# Patient Record
Sex: Male | Born: 1945 | Race: White | Hispanic: No | Smoking: Former smoker
Health system: Southern US, Community
[De-identification: ages and names within clinical notes are randomized; demographics above are authoritative.]

## PROBLEM LIST (undated history)

## (undated) DIAGNOSIS — D66 Hereditary factor VIII deficiency: Secondary | ICD-10-CM

## (undated) DIAGNOSIS — B1920 Unspecified viral hepatitis C without hepatic coma: Secondary | ICD-10-CM

## (undated) DIAGNOSIS — D684 Acquired coagulation factor deficiency: Secondary | ICD-10-CM

## (undated) DIAGNOSIS — E162 Hypoglycemia, unspecified: Secondary | ICD-10-CM

## (undated) DIAGNOSIS — K7460 Unspecified cirrhosis of liver: Secondary | ICD-10-CM

## (undated) DIAGNOSIS — K219 Gastro-esophageal reflux disease without esophagitis: Secondary | ICD-10-CM

## (undated) DIAGNOSIS — M1990 Unspecified osteoarthritis, unspecified site: Secondary | ICD-10-CM

## (undated) DIAGNOSIS — K7290 Hepatic failure, unspecified without coma: Secondary | ICD-10-CM

## (undated) DIAGNOSIS — K5790 Diverticulosis of intestine, part unspecified, without perforation or abscess without bleeding: Secondary | ICD-10-CM

## (undated) DIAGNOSIS — D126 Benign neoplasm of colon, unspecified: Secondary | ICD-10-CM

## (undated) DIAGNOSIS — E039 Hypothyroidism, unspecified: Secondary | ICD-10-CM

## (undated) HISTORY — DX: Hereditary factor VIII deficiency: D66

## (undated) HISTORY — DX: Gastro-esophageal reflux disease without esophagitis: K21.9

## (undated) HISTORY — PX: CARPAL TUNNEL RELEASE: SHX101

## (undated) HISTORY — PX: COLONOSCOPY: SHX174

## (undated) HISTORY — DX: Diverticulosis of intestine, part unspecified, without perforation or abscess without bleeding: K57.90

## (undated) HISTORY — PX: APPENDECTOMY: SHX54

## (undated) HISTORY — DX: Unspecified cirrhosis of liver: K74.60

## (undated) HISTORY — PX: ESOPHAGOGASTRODUODENOSCOPY: SHX1529

## (undated) HISTORY — DX: Benign neoplasm of colon, unspecified: D12.6

---

## 2004-02-29 ENCOUNTER — Emergency Department (HOSPITAL_COMMUNITY): Admission: EM | Admit: 2004-02-29 | Discharge: 2004-02-29 | Payer: Self-pay | Admitting: Emergency Medicine

## 2009-07-24 ENCOUNTER — Emergency Department (HOSPITAL_COMMUNITY): Admission: EM | Admit: 2009-07-24 | Discharge: 2009-07-24 | Payer: Self-pay | Admitting: Emergency Medicine

## 2011-01-27 LAB — POCT CARDIAC MARKERS
CKMB, poc: 2.1 ng/mL (ref 1.0–8.0)
CKMB, poc: 2.4 ng/mL (ref 1.0–8.0)
Myoglobin, poc: 179 ng/mL (ref 12–200)
Myoglobin, poc: 298 ng/mL (ref 12–200)
Troponin i, poc: <0.05 ng/mL (ref 0.00–0.09)
Troponin i, poc: <0.05 ng/mL (ref 0.00–0.09)

## 2011-01-27 LAB — DIFFERENTIAL
Basophils Absolute: 0 10*3/uL (ref 0.0–0.1)
Basophils Relative: 1 % (ref 0–1)
Eosinophils Absolute: 0.3 10*3/uL (ref 0.0–0.7)
Eosinophils Relative: 5 % (ref 0–5)
Lymphocytes Relative: 9 % — ABNORMAL LOW (ref 12–46)
Lymphs Abs: 0.6 10*3/uL — ABNORMAL LOW (ref 0.7–4.0)
Monocytes Absolute: 0.5 10*3/uL (ref 0.1–1.0)
Monocytes Relative: 7 % (ref 3–12)
Neutro Abs: 5.3 10*3/uL (ref 1.7–7.7)
Neutrophils Relative %: 79 % — ABNORMAL HIGH (ref 43–77)

## 2011-01-27 LAB — CBC
HCT: 39.3 % (ref 39.0–52.0)
Hemoglobin: 13.6 g/dL (ref 13.0–17.0)
MCHC: 34.6 g/dL (ref 30.0–36.0)
MCV: 101.3 fL — ABNORMAL HIGH (ref 78.0–100.0)
Platelets: 120 10*3/uL — ABNORMAL LOW (ref 150–400)
RBC: 3.88 MIL/uL — ABNORMAL LOW (ref 4.22–5.81)
RDW: 13 % (ref 11.5–15.5)
WBC: 6.7 10*3/uL (ref 4.0–10.5)

## 2011-01-27 LAB — BASIC METABOLIC PANEL
BUN: 10 mg/dL (ref 6–23)
CO2: 25 mEq/L (ref 19–32)
Calcium: 8.9 mg/dL (ref 8.4–10.5)
Chloride: 109 mEq/L (ref 96–112)
Creatinine, Ser: 1.08 mg/dL (ref 0.4–1.5)
GFR calc Af Amer: >60 mL/min (ref >60)
GFR calc non Af Amer: >60 mL/min (ref >60)
Glucose, Bld: 92 mg/dL (ref 70–99)
Potassium: 3.7 mEq/L (ref 3.5–5.1)
Sodium: 140 mEq/L (ref 135–145)

## 2011-10-03 DIAGNOSIS — Z86010 Personal history of colonic polyps: Secondary | ICD-10-CM | POA: Insufficient documentation

## 2012-04-11 DIAGNOSIS — D126 Benign neoplasm of colon, unspecified: Secondary | ICD-10-CM | POA: Insufficient documentation

## 2012-04-11 DIAGNOSIS — M160 Bilateral primary osteoarthritis of hip: Secondary | ICD-10-CM | POA: Insufficient documentation

## 2012-04-11 DIAGNOSIS — N429 Disorder of prostate, unspecified: Secondary | ICD-10-CM | POA: Insufficient documentation

## 2012-04-11 DIAGNOSIS — R351 Nocturia: Secondary | ICD-10-CM | POA: Insufficient documentation

## 2012-04-11 DIAGNOSIS — M5432 Sciatica, left side: Secondary | ICD-10-CM | POA: Insufficient documentation

## 2012-04-11 DIAGNOSIS — I951 Orthostatic hypotension: Secondary | ICD-10-CM | POA: Insufficient documentation

## 2012-04-11 DIAGNOSIS — M47817 Spondylosis without myelopathy or radiculopathy, lumbosacral region: Secondary | ICD-10-CM | POA: Insufficient documentation

## 2012-04-11 DIAGNOSIS — M47816 Spondylosis without myelopathy or radiculopathy, lumbar region: Secondary | ICD-10-CM | POA: Insufficient documentation

## 2012-04-11 DIAGNOSIS — M1610 Unilateral primary osteoarthritis, unspecified hip: Secondary | ICD-10-CM | POA: Insufficient documentation

## 2012-04-12 DIAGNOSIS — K7290 Hepatic failure, unspecified without coma: Secondary | ICD-10-CM

## 2012-04-12 DIAGNOSIS — I878 Other specified disorders of veins: Secondary | ICD-10-CM | POA: Insufficient documentation

## 2012-04-12 DIAGNOSIS — K7682 Hepatic encephalopathy: Secondary | ICD-10-CM

## 2012-04-12 DIAGNOSIS — I998 Other disorder of circulatory system: Secondary | ICD-10-CM | POA: Insufficient documentation

## 2012-04-12 HISTORY — DX: Hepatic encephalopathy: K76.82

## 2012-04-12 HISTORY — DX: Hepatic failure, unspecified without coma: K72.90

## 2012-09-19 ENCOUNTER — Encounter (HOSPITAL_COMMUNITY): Payer: Self-pay | Admitting: Physical Medicine and Rehabilitation

## 2012-09-19 ENCOUNTER — Emergency Department (HOSPITAL_COMMUNITY)
Admission: EM | Admit: 2012-09-19 | Discharge: 2012-09-19 | Disposition: A | Discharge status: Home / Self Care | Payer: Medicare Other | Attending: Emergency Medicine | Admitting: Emergency Medicine

## 2012-09-19 DIAGNOSIS — K7460 Unspecified cirrhosis of liver: Secondary | ICD-10-CM

## 2012-09-19 DIAGNOSIS — Z79899 Other long term (current) drug therapy: Secondary | ICD-10-CM | POA: Insufficient documentation

## 2012-09-19 DIAGNOSIS — Z8639 Personal history of other endocrine, nutritional and metabolic disease: Secondary | ICD-10-CM | POA: Insufficient documentation

## 2012-09-19 DIAGNOSIS — E7220 Disorder of urea cycle metabolism, unspecified: Secondary | ICD-10-CM | POA: Insufficient documentation

## 2012-09-19 DIAGNOSIS — Z862 Personal history of diseases of the blood and blood-forming organs and certain disorders involving the immune mechanism: Secondary | ICD-10-CM | POA: Insufficient documentation

## 2012-09-19 DIAGNOSIS — R7989 Other specified abnormal findings of blood chemistry: Secondary | ICD-10-CM

## 2012-09-19 HISTORY — DX: Hypoglycemia, unspecified: E16.2

## 2012-09-19 LAB — COMPREHENSIVE METABOLIC PANEL
ALT: 22 U/L (ref 0–53)
AST: 32 U/L (ref 0–37)
Albumin: 3.4 g/dL — ABNORMAL LOW (ref 3.5–5.2)
Alkaline Phosphatase: 85 U/L (ref 39–117)
BUN: 13 mg/dL (ref 6–23)
CO2: 22 mEq/L (ref 19–32)
Calcium: 8.8 mg/dL (ref 8.4–10.5)
Chloride: 108 mEq/L (ref 96–112)
Creatinine, Ser: 0.78 mg/dL (ref 0.50–1.35)
GFR calc Af Amer: >90 mL/min (ref >90)
GFR calc non Af Amer: >90 mL/min (ref >90)
Glucose, Bld: 94 mg/dL (ref 70–99)
Potassium: 3.5 mEq/L (ref 3.5–5.1)
Sodium: 142 mEq/L (ref 135–145)
Total Bilirubin: 0.9 mg/dL (ref 0.3–1.2)
Total Protein: 7.2 g/dL (ref 6.0–8.3)

## 2012-09-19 LAB — LIPASE, BLOOD: Lipase: 53 U/L (ref 11–59)

## 2012-09-19 LAB — CBC WITH DIFFERENTIAL/PLATELET
Basophils Absolute: 0 10*3/uL (ref 0.0–0.1)
Basophils Relative: 1 % (ref 0–1)
Eosinophils Absolute: 0.3 10*3/uL (ref 0.0–0.7)
Eosinophils Relative: 8 % — ABNORMAL HIGH (ref 0–5)
HCT: 40.6 % (ref 39.0–52.0)
Hemoglobin: 13.7 g/dL (ref 13.0–17.0)
Lymphocytes Relative: 20 % (ref 12–46)
Lymphs Abs: 0.9 10*3/uL (ref 0.7–4.0)
MCH: 32.6 pg (ref 26.0–34.0)
MCHC: 33.7 g/dL (ref 30.0–36.0)
MCV: 96.7 fL (ref 78.0–100.0)
Monocytes Absolute: 0.4 10*3/uL (ref 0.1–1.0)
Monocytes Relative: 10 % (ref 3–12)
Neutro Abs: 2.7 10*3/uL (ref 1.7–7.7)
Neutrophils Relative %: 62 % (ref 43–77)
Platelets: 121 10*3/uL — ABNORMAL LOW (ref 150–400)
RBC: 4.2 MIL/uL — ABNORMAL LOW (ref 4.22–5.81)
RDW: 13.1 % (ref 11.5–15.5)
WBC: 4.4 10*3/uL (ref 4.0–10.5)

## 2012-09-19 LAB — AMMONIA: Ammonia: 130 umol/L — ABNORMAL HIGH (ref 11–60)

## 2012-09-19 NOTE — ED Provider Notes (Signed)
History     CSN: [id number]  Arrival date & time 09/19/12  4098   First MD Initiated Contact with Patient 09/19/12 [phone]      Chief Complaint  Patient presents with  . Altered Mental Status    HPI Pt presents to department for evaluation of altered mental status. Per wife pt slept all day yesterday and was unable to remember daily routine. Wife states he began yelling this morning after waking up @8 :30am. Wife says he is not acting like himself today. Pupils equal and reactive upon arrival. Able to move all extremities at the time. He is alert and answering questions appropriately.  Past Medical History  Diagnosis Date  . Hypoglycemia     No past surgical history on file.  History reviewed. No pertinent family history.  History  Substance Use Topics  . Smoking status: Never Smoker   . Smokeless tobacco: Not on file  . Alcohol Use: No      Review of Systems  All other systems reviewed and are negative.    Allergies  Review of patient's allergies indicates no known allergies.  Home Medications   Current Outpatient Rx  Name  Route  Sig  Dispense  Refill  . ALFUZOSIN HCL ER 10 MG PO TB24   Oral   Take 10 mg by mouth daily.         Marland Kitchen LACTULOSE 10 GM/15ML PO SOLN   Oral   Take 20 g by mouth 2 (two) times daily.         Marland Kitchen LEVOTHYROXINE SODIUM 50 MCG PO TABS   Oral   Take 50 mcg by mouth daily.         Marland Kitchen PANTOPRAZOLE SODIUM 40 MG PO TBEC   Oral   Take 40 mg by mouth daily.           BP 159/89  Pulse 78  Temp 97.3 F (36.3 C) (Oral)  Resp 20  SpO2 99%  Physical Exam  Nursing note and vitals reviewed. Constitutional: He is oriented to person, place, and time. He appears well-developed and well-nourished. No distress.  HENT:  Head: Normocephalic and atraumatic.  Eyes: Pupils are equal, round, and reactive to light.  Neck: Normal range of motion.  Cardiovascular: Normal rate and intact distal pulses.   Pulmonary/Chest: No respiratory  distress.  Abdominal: Normal appearance. He exhibits no distension.  Musculoskeletal: Normal range of motion.  Neurological: He is alert and oriented to person, place, and time. He displays no atrophy. No cranial nerve deficit or sensory deficit. GCS eye subscore is 4. GCS verbal subscore is 5. GCS motor subscore is 6.  Skin: Skin is warm and dry. No rash noted.  Psychiatric: He has a normal mood and affect. His behavior is normal.    ED Course  Procedures (including critical care time) After speaking with the wife she thinks he's not been taking his medicines as each should.  She wants to take him home and make sure he takes his lactulose as directed.  At the present time the patient seems alert he wants to go home he is not encephalopathic and I feel even know he has not elevated ammonia level they have medication to treat that.  They can return to emergency Department or to bed this should symptoms not improve or should they worsen.   Wife Labs Reviewed  CBC WITH DIFFERENTIAL - Abnormal; Notable for the following:    RBC 4.20 (*)     Platelets 121 (*)  Eosinophils Relative 8 (*)     All other components within normal limits  COMPREHENSIVE METABOLIC PANEL - Abnormal; Notable for the following:    Albumin 3.4 (*)     All other components within normal limits  AMMONIA - Abnormal; Notable for the following:    Ammonia 130 (*)     All other components within normal limits  LIPASE, BLOOD   No results found.   1. Increased ammonia level   2. Cirrhosis       MDM          Nelia Shi, MD 09/19/12 1356

## 2012-09-19 NOTE — ED Notes (Signed)
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 Contracted from blood transfusion. 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

## 2012-09-19 NOTE — ED Notes (Signed)
Pt presents to department for evaluation of altered mental status. Per wife pt slept all day yesterday and was unable to remember daily routine. Wife states he began yelling this morning after waking up @8 :30am. Wife says he is not acting like himself today. Pupils equal and reactive upon arrival. Able to move all extremities at the time. He is alert and answering questions appropriately.

## 2015-07-02 ENCOUNTER — Emergency Department (HOSPITAL_COMMUNITY)
Admission: EM | Admit: 2015-07-02 | Discharge: 2015-07-02 | Disposition: A | Discharge status: Other Acute Inpatient Hospital | Payer: Medicare Other | Attending: Emergency Medicine | Admitting: Emergency Medicine

## 2015-07-02 ENCOUNTER — Encounter (HOSPITAL_COMMUNITY): Payer: Self-pay | Admitting: Emergency Medicine

## 2015-07-02 DIAGNOSIS — D67 Hereditary factor IX deficiency: Secondary | ICD-10-CM

## 2015-07-02 DIAGNOSIS — Z79899 Other long term (current) drug therapy: Secondary | ICD-10-CM | POA: Diagnosis not present

## 2015-07-02 DIAGNOSIS — D68311 Acquired hemophilia: Secondary | ICD-10-CM | POA: Insufficient documentation

## 2015-07-02 DIAGNOSIS — Z8639 Personal history of other endocrine, nutritional and metabolic disease: Secondary | ICD-10-CM | POA: Diagnosis not present

## 2015-07-02 DIAGNOSIS — K921 Melena: Secondary | ICD-10-CM | POA: Diagnosis not present

## 2015-07-02 DIAGNOSIS — R197 Diarrhea, unspecified: Secondary | ICD-10-CM | POA: Diagnosis present

## 2015-07-02 HISTORY — DX: Acquired coagulation factor deficiency: D68.4

## 2015-07-02 LAB — COMPREHENSIVE METABOLIC PANEL
ALT: 24 U/L (ref 17–63)
AST: 34 U/L (ref 15–41)
Albumin: 2.8 g/dL — ABNORMAL LOW (ref 3.5–5.0)
Alkaline Phosphatase: 59 U/L (ref 38–126)
Anion gap: 7 (ref 5–15)
BUN: 34 mg/dL — ABNORMAL HIGH (ref 6–20)
CO2: 22 mmol/L (ref 22–32)
Calcium: 8.9 mg/dL (ref 8.9–10.3)
Chloride: 110 mmol/L (ref 101–111)
Creatinine, Ser: 0.82 mg/dL (ref 0.61–1.24)
GFR calc Af Amer: >60 mL/min (ref >60)
GFR calc non Af Amer: >60 mL/min (ref >60)
Glucose, Bld: 114 mg/dL — ABNORMAL HIGH (ref 65–99)
Potassium: 3.7 mmol/L (ref 3.5–5.1)
Sodium: 139 mmol/L (ref 135–145)
Total Bilirubin: 1 mg/dL (ref 0.3–1.2)
Total Protein: 5.8 g/dL — ABNORMAL LOW (ref 6.5–8.1)

## 2015-07-02 LAB — URINE MICROSCOPIC-ADD ON

## 2015-07-02 LAB — LIPASE, BLOOD: Lipase: 22 U/L (ref 22–51)

## 2015-07-02 LAB — HEMOGLOBIN AND HEMATOCRIT, BLOOD
HCT: 32.4 % — ABNORMAL LOW (ref 39.0–52.0)
Hemoglobin: 10.9 g/dL — ABNORMAL LOW (ref 13.0–17.0)

## 2015-07-02 LAB — PROTIME-INR
INR: 1.23 (ref 0.00–1.49)
Prothrombin Time: 15.6 seconds — ABNORMAL HIGH (ref 11.6–15.2)

## 2015-07-02 LAB — CBC
HCT: 33.5 % — ABNORMAL LOW (ref 39.0–52.0)
Hemoglobin: 11.3 g/dL — ABNORMAL LOW (ref 13.0–17.0)
MCH: 33.8 pg (ref 26.0–34.0)
MCHC: 33.7 g/dL (ref 30.0–36.0)
MCV: 100.3 fL — ABNORMAL HIGH (ref 78.0–100.0)
Platelets: 129 10*3/uL — ABNORMAL LOW (ref 150–400)
RBC: 3.34 MIL/uL — ABNORMAL LOW (ref 4.22–5.81)
RDW: 13 % (ref 11.5–15.5)
WBC: 8 10*3/uL (ref 4.0–10.5)

## 2015-07-02 LAB — URINALYSIS, ROUTINE W REFLEX MICROSCOPIC
Bilirubin Urine: NEGATIVE
Glucose, UA: NEGATIVE mg/dL
Ketones, ur: NEGATIVE mg/dL
Leukocytes, UA: NEGATIVE
Nitrite: NEGATIVE
Protein, ur: NEGATIVE mg/dL
Specific Gravity, Urine: 1.016 (ref 1.005–1.030)
Urobilinogen, UA: 1 mg/dL (ref 0.0–1.0)
pH: 6.5 (ref 5.0–8.0)

## 2015-07-02 LAB — TYPE AND SCREEN
ABO/RH(D): O POS
Antibody Screen: NEGATIVE

## 2015-07-02 LAB — ABO/RH: ABO/RH(D): O POS

## 2015-07-02 MED ORDER — SODIUM CHLORIDE 0.9 % IV SOLN
80.0000 mg | Freq: Once | INTRAVENOUS | Status: AC
  Administered 2015-07-02: 80 mg via INTRAVENOUS
  Filled 2015-07-02: qty 80

## 2015-07-02 MED ORDER — SODIUM CHLORIDE 0.9 % IV SOLN
8.0000 mg/h | INTRAVENOUS | Status: DC
  Administered 2015-07-02: 8 mg/h via INTRAVENOUS
  Filled 2015-07-02 (×2): qty 80

## 2015-07-02 MED ORDER — SODIUM CHLORIDE 0.9 % IV BOLUS (SEPSIS)
1000.0000 mL | Freq: Once | INTRAVENOUS | Status: AC
  Administered 2015-07-02: 1000 mL via INTRAVENOUS

## 2015-07-02 MED ORDER — COAGULATION FACTOR IX (RECOMB) 250 UNITS IV SOLR
8000.0000 [IU] | Freq: Once | INTRAVENOUS | Status: AC
  Administered 2015-07-02: 8000 [IU] via INTRAVENOUS

## 2015-07-02 NOTE — ED Notes (Signed)
Benefix verified by pharmacy prior to giving.  Home med given.

## 2015-07-02 NOTE — ED Provider Notes (Signed)
CSN: [id number]     Arrival date & time 07/02/15  1704 History   First MD Initiated Contact with Patient 07/02/15 1803     Chief Complaint  Patient presents with  . Diarrhea  . Emesis  . Melena     HPI   Justin Hampton is a 69 y.o. male with a PMH of hypoglycemia and factor IV deficiency who presents to the ED with vomiting and diarrhea, which started this morning. He states he woke up this morning and felt dizzy; he thought his blood sugar was low, so her took two sugar pills. He then felt nauseous, and had one episode of emesis, which he reports was black. He also states he had 3 episodes of "black loose stools" today. He denies recent illness, headache, lightheadedness, syncope, chest pain, shortness of breath, fever, chills, abdominal pain, dysuria, urgency, frequency. He states this has never happened to him before. Denies bright red blood in stool.   Past Medical History  Diagnosis Date  . Hypoglycemia   . 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 Appendectomy     No family history on file. Social History  Substance Use Topics  . Smoking status: Never Smoker   . Smokeless tobacco: None  . Alcohol Use: No      Review of Systems  Constitutional: Negative for fever, chills, activity change, appetite change and fatigue.  Respiratory: Negative for cough and shortness of breath.   Cardiovascular: Negative for chest pain, palpitations and leg swelling.  Gastrointestinal: Positive for nausea, vomiting and diarrhea. Negative for abdominal pain, constipation and abdominal distention.  Genitourinary: Negative for dysuria, urgency and frequency.  Musculoskeletal: Negative for myalgias, back pain, arthralgias and neck pain.  Skin: Negative for color change, pallor, rash and wound.  Neurological: Negative for dizziness, syncope, weakness, light-headedness, numbness and headaches.  All other systems reviewed and are  negative.     Allergies  Review of patient's allergies indicates no known allergies.  Home Medications   Prior to Admission medications   Medication Sig Start Date End Date Taking? Authorizing Provider  alfuzosin (UROXATRAL) 10 MG 24 hr tablet Take 10 mg by mouth daily.    Historical Provider, MD  lactulose (CHRONULAC) 10 GM/15ML solution Take 20 g by mouth 2 (two) times daily.    Historical Provider, MD  levothyroxine (SYNTHROID, LEVOTHROID) 50 MCG tablet Take 50 mcg by mouth daily.    Historical Provider, MD  pantoprazole (PROTONIX) 40 MG tablet Take 40 mg by mouth daily.    Historical Provider, MD    BP 135/81 mmHg  Pulse 78  Temp(Src) 98.2 F (36.8 C) (Oral)  Resp 15  Ht 5\' 9"  (1.753 m)  Wt 226 lb (102.513 kg)  BMI 33.36 kg/m2  SpO2 99% Physical Exam  Constitutional: He is oriented to person, place, and time. He appears well-developed and well-nourished. No distress.  HENT:  Head: Normocephalic and atraumatic.  Right Ear: External ear normal.  Left Ear: External ear normal.  Nose: Nose normal.  Mouth/Throat: Uvula is midline and mucous membranes are normal.  Eyes: Conjunctivae, EOM and lids are normal. Pupils are equal, round, and reactive to light. Right eye exhibits no discharge. Left eye exhibits no discharge. No scleral icterus.  Neck: Normal range of motion. Neck supple.  Cardiovascular: Normal rate, regular rhythm, normal heart sounds, intact distal pulses and normal pulses.   Pulmonary/Chest: Effort normal and breath sounds normal.  No respiratory distress. He has no wheezes. He has no rales. He exhibits no tenderness.  Abdominal: Soft. Normal appearance and bowel sounds are normal. He exhibits no distension and no mass. There is no tenderness. There is no rigidity, no rebound and no guarding.  Genitourinary: Guaiac positive stool.  Melanotic stool in rectal vault. Hemoccult positive.  Musculoskeletal: Normal range of motion. He exhibits no edema or tenderness.   Neurological: He is alert and oriented to person, place, and time. He has normal strength.  Skin: Skin is warm, dry and intact. No rash noted. He is not diaphoretic. No erythema. No pallor.  Psychiatric: He has a normal mood and affect. His speech is normal and behavior is normal. Judgment and thought content normal.  Nursing note and vitals reviewed.   ED Course  Procedures (including critical care time)  Labs Review Labs Reviewed  COMPREHENSIVE METABOLIC PANEL - Abnormal; Notable for the following:    Glucose, Bld 114 (*)    BUN 34 (*)    Total Protein 5.8 (*)    Albumin 2.8 (*)    All other components within normal limits  CBC - Abnormal; Notable for the following:    RBC 3.34 (*)    Hemoglobin 11.3 (*)    HCT 33.5 (*)    MCV 100.3 (*)    Platelets 129 (*)    All other components within normal limits  URINALYSIS, ROUTINE W REFLEX MICROSCOPIC (NOT AT [facility]) - Abnormal; Notable for the following:    Hgb urine dipstick SMALL (*)    All other components within normal limits  LIPASE, BLOOD  URINE MICROSCOPIC-ADD ON  PROTIME-INR  POC OCCULT BLOOD, ED  TYPE AND SCREEN    Imaging Review No results found.   I have personally reviewed and evaluated these lab results as part of my medical decision-making.   EKG Interpretation None      MDM   Final diagnoses:  Hemophilia B  Melena    70 year old male presents with vomiting and diarrhea, which started this morning. Reports 1 episode of black emesis and 3 episodes of melena. Has a history of factor IV deficiency and is followed in hemophilia clinic at [facility] by Dr. Alba Cory. Patient reports he takes benefix (usually 3000-4000 units) with acute bleeding.  Patient is afebrile. Vital signs stable. BP 135/81. Mucous membranes dry. Abdomen soft, non-tender, non-distended. Melanotic stool in rectal vault. Hemoccult positive.  CBC with hemoglobin 11.3, last hemoglobin 13.3 in June 2016. CMP, lipase unremarkable. Coags pending. UA  negative for infection, small hemoglobin. Coags, type and screen pending  Given 1L bolus NS in the ED.  Patient discussed with and seen by Dr. Ralene Bathe. Hematology consulted. Patient to be admitted for further evaluation and management. Hemophilia clinic at [facility] contacted about benefix dosing and possible admission.   BP 135/81 mmHg  Pulse 78  Temp(Src) 98.2 F (36.8 C) (Oral)  Resp 15  Ht 5\' 9"  (1.753 m)  Wt 226 lb (102.513 kg)  BMI 33.36 kg/m2  SpO2 99%     Marella Chimes, PA-C 07/02/15 2018  Quintella Reichert, MD 07/03/15 1623

## 2015-07-02 NOTE — ED Notes (Signed)
Patient here with complaint of diarrhea and vomiting starting this AM around 0600. States 1 episode of emesis and 3 episodes of black diarrhea. Reports history of clotting factor deficiency.

## 2015-07-02 NOTE — ED Notes (Signed)
Called [facility] for transfer to [facility] - on the way now

## 2015-07-02 NOTE — ED Notes (Signed)
[facility] here to transport patient at this time.  ?

## 2016-05-21 ENCOUNTER — Emergency Department (HOSPITAL_COMMUNITY)
Admission: EM | Admit: 2016-05-21 | Discharge: 2016-05-21 | Disposition: A | Discharge status: Home / Self Care | Payer: Medicare Other | Attending: Emergency Medicine | Admitting: Emergency Medicine

## 2016-05-21 ENCOUNTER — Emergency Department (HOSPITAL_COMMUNITY): Payer: Medicare Other

## 2016-05-21 ENCOUNTER — Encounter (HOSPITAL_COMMUNITY): Payer: Self-pay | Admitting: *Deleted

## 2016-05-21 DIAGNOSIS — S60512A Abrasion of left hand, initial encounter: Secondary | ICD-10-CM | POA: Diagnosis not present

## 2016-05-21 DIAGNOSIS — S0990XA Unspecified injury of head, initial encounter: Secondary | ICD-10-CM | POA: Insufficient documentation

## 2016-05-21 DIAGNOSIS — S52501A Unspecified fracture of the lower end of right radius, initial encounter for closed fracture: Secondary | ICD-10-CM

## 2016-05-21 DIAGNOSIS — Y929 Unspecified place or not applicable: Secondary | ICD-10-CM | POA: Diagnosis not present

## 2016-05-21 DIAGNOSIS — Y999 Unspecified external cause status: Secondary | ICD-10-CM | POA: Insufficient documentation

## 2016-05-21 DIAGNOSIS — W0110XA Fall on same level from slipping, tripping and stumbling with subsequent striking against unspecified object, initial encounter: Secondary | ICD-10-CM | POA: Diagnosis not present

## 2016-05-21 DIAGNOSIS — Z79899 Other long term (current) drug therapy: Secondary | ICD-10-CM | POA: Insufficient documentation

## 2016-05-21 DIAGNOSIS — Y9301 Activity, walking, marching and hiking: Secondary | ICD-10-CM | POA: Insufficient documentation

## 2016-05-21 DIAGNOSIS — S6991XA Unspecified injury of right wrist, hand and finger(s), initial encounter: Secondary | ICD-10-CM | POA: Diagnosis present

## 2016-05-21 DIAGNOSIS — S52591A Other fractures of lower end of right radius, initial encounter for closed fracture: Secondary | ICD-10-CM | POA: Insufficient documentation

## 2016-05-21 DIAGNOSIS — D67 Hereditary factor IX deficiency: Secondary | ICD-10-CM | POA: Diagnosis not present

## 2016-05-21 MED ORDER — OXYCODONE-ACETAMINOPHEN 5-325 MG PO TABS
1.0000 | ORAL_TABLET | Freq: Once | ORAL | Status: AC
  Administered 2016-05-21: 1 via ORAL
  Filled 2016-05-21: qty 1

## 2016-05-21 MED ORDER — COAGULATION FACTOR IX (RECOMB) 1000 UNITS IV KIT
6180.0000 [IU] | PACK | Freq: Once | INTRAVENOUS | Status: AC
  Administered 2016-05-21: 6180 [IU] via INTRAVENOUS
  Filled 2016-05-21: qty 6180

## 2016-05-21 MED ORDER — OXYCODONE-ACETAMINOPHEN 5-325 MG PO TABS
1.0000 | ORAL_TABLET | Freq: Once | ORAL | Status: AC
Start: 2016-05-21 — End: 2016-05-21
  Administered 2016-05-21: 1 via ORAL
  Filled 2016-05-21: qty 1

## 2016-05-21 MED ORDER — OXYCODONE-ACETAMINOPHEN 5-325 MG PO TABS: 1.0000 | ORAL_TABLET | ORAL | 0 refills | Status: DC | PRN

## 2016-05-21 NOTE — ED Triage Notes (Signed)
Per EMS pt was outside walking his dog, got off balance and fell forward; c/o right wrist pain. EMS reports swelling and bruising noted

## 2016-05-21 NOTE — ED Provider Notes (Signed)
[facility] DEPT Provider Note   CSN: [id number] Arrival date & time: 05/21/16  1028  First Provider Contact:  First MD Initiated Contact with Patient 05/21/16 1104        History   Chief Complaint Chief Complaint  Patient presents with  . Fall  . Wrist Injury    HPI Justin Hampton is a 70 y.o. male.  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 The patient reports that he was walking his dog when he tripped and fell onto his outstretched right arm. The patient reports new pain, swelling, deformity to that arm. He reports a similar injury in January of this year. He followed at [facility] but did not require any surgical intervention. Patient does report having hit his head but denies any significant head injury. He denies headache, changes in vision, nausea, vomiting, neurologic changes.  Reports a tetanus vaccination in January of this year.      Past Medical History:  Diagnosis Date  . 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 Hypoglycemia     There are no active problems to display for this patient.   Past Surgical History:  Procedure Laterality Date  . APPENDECTOMY         Home Medications    Prior to Admission medications   Medication Sig Start Date End Date Taking? Authorizing Provider  lactulose (CHRONULAC) 10 GM/15ML solution Take 20 g by mouth 2 (two) times daily.  01/13/16 01/12/17 Yes Historical Provider, MD  levothyroxine (SYNTHROID, LEVOTHROID) 50 MCG tablet Take 50 mcg by mouth daily.   Yes Historical Provider, MD  spironolactone (ALDACTONE) 100 MG tablet Take 100 mg by mouth daily.  05/04/16  Yes Historical Provider, MD  oxyCODONE-acetaminophen (PERCOCET/ROXICET) 5-325 MG tablet Take 1-2 tablets by mouth every 4 (four) hours as needed for severe pain. 05/21/16   Harvel Quale, MD    Family History No family history on file.  Social History Social History  Substance Use Topics  . Smoking  status: Never Smoker  . Smokeless tobacco: Never Used  . Alcohol use No     Allergies   Morphine and Zolpidem   Review of Systems Review of Systems  Constitutional: Negative for appetite change, diaphoresis, fatigue and unexpected weight change.  HENT: Negative for congestion, postnasal drip, rhinorrhea and sinus pressure.   Respiratory: Negative for cough, chest tightness and shortness of breath.   Cardiovascular: Negative for chest pain and palpitations.  Gastrointestinal: Negative for abdominal pain, constipation, diarrhea, nausea and vomiting.  Genitourinary: Negative for dysuria, hematuria and urgency.  Musculoskeletal: Positive for arthralgias (right wrist). Negative for back pain, neck pain and neck stiffness.  Skin: Negative for rash.  Neurological: Negative for dizziness, seizures, weakness, light-headedness and numbness.  Hematological: Bruises/bleeds easily.     Physical Exam Updated Vital Signs BP 146/88 (BP Location: Left Arm)   Pulse 78   Resp 16   SpO2 100%   Physical Exam  Constitutional: He is oriented to person, place, and time. He appears well-developed and well-nourished. No distress.  HENT:  Head: Normocephalic and atraumatic.  Right Ear: External ear normal.  Left Ear: External ear normal.  Mouth/Throat: Oropharynx is clear and moist. No oropharyngeal exudate.  Eyes: EOM are normal. Pupils are equal, round, and reactive to light.  Neck: Normal range of motion. Neck supple.  Cardiovascular: Normal rate, regular rhythm, normal heart sounds and intact distal pulses.   No murmur heard. Pulmonary/Chest: Effort normal.  No respiratory distress. He has no wheezes. He has no rales.  Abdominal: Soft. He exhibits no distension. There is no tenderness.  Musculoskeletal: He exhibits no edema.       Right wrist: He exhibits decreased range of motion, tenderness, bony tenderness and deformity. He exhibits no effusion, no crepitus and no laceration.       Right  hand: He exhibits normal range of motion, no bony tenderness, normal two-point discrimination, normal capillary refill, no deformity and no swelling. Normal sensation noted. Normal strength noted.  Neurological: He is alert and oriented to person, place, and time. He has normal strength. No sensory deficit.  Skin: Skin is warm and dry. Abrasion (over ther MCP and PIP joints of the left hand) noted. No rash noted. He is not diaphoretic.  Vitals reviewed.    ED Treatments / Results  Labs (all labs ordered are listed, but only abnormal results are displayed) Labs Reviewed - No data to display  EKG  EKG Interpretation None       Radiology Dg Wrist Complete Right  Result Date: 05/21/2016 CLINICAL DATA:  Fall on outstretched right wrist this morning. EXAM: RIGHT WRIST - COMPLETE 3+ VIEW COMPARISON:  None. FINDINGS: There is a comminuted distal right radial fracture with posterior displacement 1/2 shaft width and posterior angulation. Ulnar styloid fracture noted. No subluxation or dislocation. IMPRESSION: Posteriorly displaced and angulated comminuted distal right radial fracture. Ulnar styloid fracture. Electronically Signed   By: Rolm Baptise M.D.   On: 05/21/2016 11:32  Ct Head Wo Contrast  Result Date: 05/21/2016 CLINICAL DATA:  Post fall, now with wrist pain. EXAM: CT HEAD WITHOUT CONTRAST TECHNIQUE: Contiguous axial images were obtained from the base of the skull through the vertex without intravenous contrast. COMPARISON:  None. FINDINGS: Brain: Mild atrophy with sulcal prominence centralized volume loss with commensurate ex vacuo dilatation of the ventricular system. Scattered periventricular hypodensities compatible with microvascular ischemic disease. The gray-white differentiation is otherwise well maintained without CT evidence of acute large territory infarct. No intraparenchymal or extra-axial mass or hemorrhage. Normal configuration of the ventricles and basilar cisterns. No  midline shift. Vascular: No hyperdense vessel or unexpected calcification. Skull: No displaced calvarial fracture. Sinuses/Orbits: Limited visualization the paranasal sinuses and mastoid air cells is normal. No air-fluid levels. Other: Regional soft tissues appear normal. No radiopaque foreign body. IMPRESSION: Mild atrophy and microvascular ischemic disease without acute intracranial process. Electronically Signed   By: Sandi Mariscal M.D.   On: 05/21/2016 14:22   Procedures Procedures (including critical care time)  Medications Ordered in ED Medications  oxyCODONE-acetaminophen (PERCOCET/ROXICET) 5-325 MG per tablet 1 tablet (not administered)  oxyCODONE-acetaminophen (PERCOCET/ROXICET) 5-325 MG per tablet 1 tablet (1 tablet Oral Given 05/21/16 1130)  coagulation factor IX (recomb) (BENEFIX) injection 6,180 Units (6,180 Units Intravenous Given 05/21/16 1513)     Initial Impression / Assessment and Plan / ED Course  I have reviewed the triage vital signs and the nursing notes.  Pertinent labs & imaging results that were available during my care of the patient were reviewed by me and considered in my medical decision making (see chart for details).  Clinical Course    Patient was seen and evaluated in stable condition. Patient neurovascularly intact. Case and imaging were discussed with Dr.Gramig on the phone who said he was happy to take care of the patient here but thought it may be in the patient's best interest to have his care at [facility] in coordination with his hematologist.  He  recommended short arm splint and outpatient follow up as soon as possible in the office next week.  Case was also discussed with Dr. Alba Cory the patient's hmeatologist who recommended giving the patient 6400 units of Factor 9 which was given.  Wounds were cleaned and dressed.  Patient and wife expressed understanding and agreement with plan for discharge and outpatient follow up at [facility].  Strict return  precautions given.  Final Clinical Impressions(s) / ED Diagnoses   Final diagnoses:  Distal radius fracture, right, closed, initial encounter    New Prescriptions New Prescriptions   OXYCODONE-ACETAMINOPHEN (PERCOCET/ROXICET) 5-325 MG TABLET    Take 1-2 tablets by mouth every 4 (four) hours as needed for severe pain.     Harvel Quale, MD 05/21/16 [phone]

## 2016-05-21 NOTE — ED Notes (Signed)
Bed: WA07 Expected date:  Expected time:  Means of arrival:  Comments: 

## 2016-05-21 NOTE — Discharge Instructions (Signed)
You were seen and evaluated today following her fall. You have broken the bone in your forearm.  Please keep your splint in place and follow-up with an orthopedic surgeon outpatient. After you have been seen by the orthopedic surgeon please make sure you contact Dr. Einar Crow if they are recommending surgery. Return for any sudden changes in sensation, sudden headache, sudden weakness or loss of sensation. Use the pain medication prescribed to help control your pain. Please ice and elevate your arm as much as possible.

## 2016-06-19 ENCOUNTER — Emergency Department (HOSPITAL_COMMUNITY)
Admission: EM | Admit: 2016-06-19 | Discharge: 2016-06-19 | Disposition: A | Discharge status: Home / Self Care | Payer: Medicare Other | Attending: Emergency Medicine | Admitting: Emergency Medicine

## 2016-06-19 ENCOUNTER — Encounter (HOSPITAL_COMMUNITY): Payer: Self-pay | Admitting: Emergency Medicine

## 2016-06-19 ENCOUNTER — Emergency Department (HOSPITAL_COMMUNITY): Payer: Medicare Other

## 2016-06-19 DIAGNOSIS — R0602 Shortness of breath: Secondary | ICD-10-CM | POA: Diagnosis not present

## 2016-06-19 DIAGNOSIS — Z79899 Other long term (current) drug therapy: Secondary | ICD-10-CM | POA: Diagnosis not present

## 2016-06-19 DIAGNOSIS — R0600 Dyspnea, unspecified: Secondary | ICD-10-CM | POA: Diagnosis not present

## 2016-06-19 DIAGNOSIS — R42 Dizziness and giddiness: Secondary | ICD-10-CM | POA: Diagnosis present

## 2016-06-19 LAB — BASIC METABOLIC PANEL
Anion gap: 4 — ABNORMAL LOW (ref 5–15)
BUN: 23 mg/dL — ABNORMAL HIGH (ref 6–20)
CO2: 22 mmol/L (ref 22–32)
Calcium: 9.1 mg/dL (ref 8.9–10.3)
Chloride: 106 mmol/L (ref 101–111)
Creatinine, Ser: 1.22 mg/dL (ref 0.61–1.24)
GFR calc Af Amer: >60 mL/min (ref >60)
GFR calc non Af Amer: 58 mL/min — ABNORMAL LOW (ref >60)
Glucose, Bld: 103 mg/dL — ABNORMAL HIGH (ref 65–99)
Potassium: 5 mmol/L (ref 3.5–5.1)
Sodium: 132 mmol/L — ABNORMAL LOW (ref 135–145)

## 2016-06-19 LAB — CBC WITH DIFFERENTIAL/PLATELET
Basophils Absolute: 0.1 10*3/uL (ref 0.0–0.1)
Basophils Relative: 1 %
Eosinophils Absolute: 0.3 10*3/uL (ref 0.0–0.7)
Eosinophils Relative: 5 %
HCT: 38.1 % — ABNORMAL LOW (ref 39.0–52.0)
Hemoglobin: 12.9 g/dL — ABNORMAL LOW (ref 13.0–17.0)
Lymphocytes Relative: 13 %
Lymphs Abs: 0.7 10*3/uL (ref 0.7–4.0)
MCH: 32.3 pg (ref 26.0–34.0)
MCHC: 33.9 g/dL (ref 30.0–36.0)
MCV: 95.5 fL (ref 78.0–100.0)
Monocytes Absolute: 0.6 10*3/uL (ref 0.1–1.0)
Monocytes Relative: 10 %
Neutro Abs: 3.9 10*3/uL (ref 1.7–7.7)
Neutrophils Relative %: 71 %
Platelets: 162 10*3/uL (ref 150–400)
RBC: 3.99 MIL/uL — ABNORMAL LOW (ref 4.22–5.81)
RDW: 14.3 % (ref 11.5–15.5)
WBC: 5.6 10*3/uL (ref 4.0–10.5)

## 2016-06-19 LAB — BRAIN NATRIURETIC PEPTIDE: B Natriuretic Peptide: 33.9 pg/mL (ref 0.0–100.0)

## 2016-06-19 LAB — TROPONIN I: Troponin I: <0.03 ng/mL (ref <0.03)

## 2016-06-19 LAB — I-STAT TROPONIN, ED: Troponin i, poc: 0 ng/mL (ref 0.00–0.08)

## 2016-06-19 NOTE — Discharge Instructions (Signed)
You were seen in the ED today with sudden difficulty breathing and lightheadedness. We looked closely for signs of heart attack and found none. Follow up with your PCP in the coming days for review of your visit and consideration of additional testing.   I have also provided contact information for a Cardiologist who you may also call for follow up and consideration of stress testing.   Return to the ED with any additional difficulty breathing, chest pain, sudden lightheadedness, or other concerning symptoms for you.

## 2016-06-19 NOTE — ED Triage Notes (Signed)
Pt c/o SOB, onset this morning on waking. Pt reports dizziness on exertion. Patient started taking spironolactone 1 month ago. No other symptoms.

## 2016-06-19 NOTE — ED Provider Notes (Signed)
Emergency Department Provider Note   I have reviewed the triage vital signs and the nursing notes.   HISTORY  Chief Complaint Shortness of Breath   HPI Justin Hampton is a 70 y.o. male with PMH hemophilia B and episodic hypoglycemia presents to the emergency department for evaluation of sudden onset lightheadedness and difficulty breathing. Patient states symptoms began after getting out of bed. He initially felt difficulty breathing which was seen and followed by lightheadedness. No similar prior episodes. No associated chest discomfort with this episode. Patient denies vertigo. No unilateral weakness or numbness. No headache. Patient was able to breathe slowly and control his breathing. He feels now he is back to normal. No specific exacerbating or alleviating factors.    Past Medical History:  Diagnosis Date  . 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 Hypoglycemia     There are no active problems to display for this patient.   Past Surgical History:  Procedure Laterality Date  . APPENDECTOMY      Current Outpatient Rx  . Order #: [id number] Class: Historical Med  . Order #: [id number] Class: Historical Med  . Order #: [id number] Class: Print  . Order #: [id number] Class: Historical Med    Allergies Morphine and Zolpidem  History reviewed. No pertinent family history.  Social History Social History  Substance Use Topics  . Smoking status: Never Smoker  . Smokeless tobacco: Never Used  . Alcohol use No    Review of Systems  Constitutional: No fever/chills; Positive lightheadedness.  Eyes: No visual changes. ENT: No sore throat. Cardiovascular: Denies chest pain. Respiratory: Positive shortness of breath. Gastrointestinal: No abdominal pain.  No nausea, no vomiting.  No diarrhea.  No constipation. Genitourinary: Negative for dysuria. Musculoskeletal: Negative for back pain. Skin: Negative for rash. Neurological: Negative for headaches,  focal weakness or numbness.  10-point ROS otherwise negative.  ____________________________________________   PHYSICAL EXAM:  VITAL SIGNS: ED Triage Vitals  Enc Vitals Group     BP 06/19/16 1011 134/70     Pulse Rate 06/19/16 1011 78     Resp 06/19/16 1011 16     Temp 06/19/16 1017 98.1 F (36.7 C)     Temp Source 06/19/16 1017 Oral     SpO2 06/19/16 1011 100 %    Constitutional: Alert and oriented. Well appearing and in no acute distress. Eyes: Conjunctivae are normal. Head: Atraumatic. Nose: No congestion/rhinnorhea. Mouth/Throat: Mucous membranes are moist.  Oropharynx non-erythematous. Neck: No stridor.   Cardiovascular: Normal rate, regular rhythm. Good peripheral circulation. Grossly normal heart sounds.   Respiratory: Normal respiratory effort.  No retractions. Lungs CTAB. Gastrointestinal: Soft and nontender. No distention.  Musculoskeletal: No lower extremity tenderness nor edema. No gross deformities of extremities. Cast over right forearm.  Neurologic:  Normal speech and language. No gross focal neurologic deficits are appreciated.  Skin:  Skin is warm, dry and intact. No rash noted. Psychiatric: Mood and affect are normal. Speech and behavior are normal.  ____________________________________________   LABS (all labs ordered are listed, but only abnormal results are displayed)  Labs Reviewed  BASIC METABOLIC PANEL - Abnormal; Notable for the following:       Result Value   Sodium 132 (*)    Glucose, Bld 103 (*)    BUN 23 (*)    GFR calc non Af Amer 58 (*)    Anion gap 4 (*)    All other components within normal limits  CBC WITH DIFFERENTIAL/PLATELET - Abnormal; Notable for  the following:    RBC 3.99 (*)    Hemoglobin 12.9 (*)    HCT 38.1 (*)    All other components within normal limits  BRAIN NATRIURETIC PEPTIDE  TROPONIN I  I-STAT TROPOININ, ED   ____________________________________________  EKG   EKG Interpretation  Date/Time:  Sunday  June 19 2016 10:13:46 EDT Ventricular Rate:  75 PR Interval:    QRS Duration: 86 QT Interval:  400 QTC Calculation: 447 R Axis:   42 Text Interpretation:  Sinus rhythm Low voltage, precordial leads Baseline wander in lead(s) V6 No STEMI.  Confirmed by Zahira Brummond MD, Doratha Mcswain [phone]) on 06/19/2016 10:45:36 AM       ____________________________________________  RADIOLOGY  Dg Chest 2 View  Result Date: 06/19/2016 CLINICAL DATA:  Patient states SOB and dizziness since this morning. Former smoker. EXAM: CHEST  2 VIEW COMPARISON:  07/24/2009 FINDINGS: Normal mediastinum and cardiac silhouette. Normal pulmonary vasculature. No evidence of effusion, infiltrate, or pneumothorax. No acute bony abnormality. Degenerative osteophytosis of the thoracic spine. IMPRESSION: No active cardiopulmonary disease. Electronically Signed   By: Suzy Bouchard M.D.   On: 06/19/2016 10:40    ____________________________________________   PROCEDURES  Procedure(s) performed:   Procedures  None ____________________________________________   INITIAL IMPRESSION / ASSESSMENT AND PLAN / ED COURSE  Pertinent labs & imaging results that were available during my care of the patient were reviewed by me and considered in my medical decision making (see chart for details).  Patient presents to the emergency department for evaluation of difficulty breathing and lightheadedness. No chest pain. Symptoms resolved spontaneously. No history of prior. No history of DVT/PE. Patient has hemophilia B. No new medications. Differential includes: ACS, PE, spontaneous PNX, PNA. EKG reviewed with no acute ischemia or arrhythmia. CXR pending. No treatment at this time with SOB resolved. Plan for labs and serial cardiac biomarkers.   12:32 PM Updated patient on lab results. No symptoms since arrival in the ED. Will follow 2 PM troponin and reassess.   03:12 PM Repeat troponin negative. Patient remains asymptomatic. Plan for discharge  with PCP follow up to discuss outpatient stress testing as needed.   At this time, I do not feel there is any life-threatening condition present. I have reviewed and discussed all results (EKG, imaging, lab, urine as appropriate), exam findings with patient. I have reviewed nursing notes and appropriate previous records.  I feel the patient is safe to be discharged home without further emergent workup. Discussed usual and customary return precautions. Patient and family (if present) verbalize understanding and are comfortable with this plan.  Patient will follow-up with their primary care provider. If they do not have a primary care provider, information for follow-up has been provided to them. All questions have been answered.  ____________________________________________  FINAL CLINICAL IMPRESSION(S) / ED DIAGNOSES  Final diagnoses:  Dyspnea     MEDICATIONS GIVEN DURING THIS VISIT:  None  NEW OUTPATIENT MEDICATIONS STARTED DURING THIS VISIT:  None   Note:  This document was prepared using Dragon voice recognition software and may include unintentional dictation errors.  Nanda Quinton, MD Emergency Medicine   Margette Fast, MD 06/19/16 [phone]

## 2016-07-06 ENCOUNTER — Telehealth: Payer: Self-pay | Admitting: Cardiovascular Disease

## 2016-07-06 ENCOUNTER — Encounter: Payer: Self-pay | Admitting: Cardiovascular Disease

## 2016-07-06 NOTE — Telephone Encounter (Signed)
Called pt and left message asking pt to call back to update Fm and medical Hx and to get previous provider information.

## 2016-07-08 ENCOUNTER — Encounter: Payer: Self-pay | Admitting: Cardiovascular Disease

## 2016-07-08 ENCOUNTER — Ambulatory Visit (INDEPENDENT_AMBULATORY_CARE_PROVIDER_SITE_OTHER): Payer: Medicare Other | Admitting: Cardiovascular Disease

## 2016-07-08 DIAGNOSIS — R0600 Dyspnea, unspecified: Secondary | ICD-10-CM | POA: Insufficient documentation

## 2016-07-08 NOTE — Patient Instructions (Signed)

## 2016-07-08 NOTE — Progress Notes (Signed)
Cardiology Office Note   Date:  07/08/2016   ID:  Justin Hampton, DOB [birth year], MRN [id number]  PCP:  Benjamine Sprague, MD  Cardiologist:   Mertie Moores, MD   Chief Complaint  Patient presents with  . Shortness of Breath   Problem list 1. Joints breath  2. 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Factor 9 deficiency)      History of Present Illness: Justin Hampton is a 70 y.o. male who presents for evaluation of his shortness of breath.  Justin Hampton is seen today for follow up of an ER visit 2 weeks ago He woke up with dizziness and shortness of breath. He had taken an oxycodone tablet the night before to help with pain from his right wrist. ER eval was negative.  BNP was 33.9. Troponin was negative  The symptoms lasted several hours and have not recurred.  Has had some mild dyspnea.   Associated with some mild leg edema  Dr.  Lyanne Co ( in [place], [facility]  prescribed Spironolactone -  Symptoms have resolved. K was 5.0   Walks his dogs occasionally. 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       Past Medical History:  Diagnosis Date  . 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 Hypoglycemia     Past Surgical History:  Procedure Laterality Date  . APPENDECTOMY       Current Outpatient Prescriptions  Medication Sig Dispense Refill  . lactulose (CHRONULAC) 10 GM/15ML solution Take 20 g by mouth 2 (two) times daily as needed for moderate constipation.     Marland Kitchen levothyroxine (SYNTHROID, LEVOTHROID) 50 MCG tablet Take 50 mcg by mouth daily.    Marland Kitchen spironolactone (ALDACTONE) 100 MG tablet Take 100 mg by mouth daily.      No current facility-administered medications for this visit.     Allergies:   Morphine and Zolpidem    Social History:  The patient  reports that he has never smoked. He has never used smokeless tobacco. He reports that he does not drink alcohol or use drugs.   Family History:  The patient's family history includes  Dementia in his mother; Heart attack in his father; Hypertension in his father.    ROS:  Please see the history of present illness.    Review of Systems: Constitutional:  denies fever, chills, diaphoresis, appetite change and fatigue.  HEENT: denies photophobia, eye pain, redness, hearing loss, ear pain, congestion, sore throat, rhinorrhea, sneezing, neck pain, neck stiffness and tinnitus.  Respiratory: denies SOB, DOE, cough, chest tightness, and wheezing.  Cardiovascular: denies chest pain, palpitations and leg swelling.  Gastrointestinal: denies nausea, vomiting, abdominal pain, diarrhea, constipation, blood in stool.  Genitourinary: denies dysuria, urgency, frequency, hematuria, flank pain and difficulty urinating.  Musculoskeletal: denies  myalgias, back pain, joint swelling, arthralgias and gait problem.   Skin: denies pallor, rash and wound.  Neurological: denies dizziness, seizures, syncope, weakness, light-headedness, numbness and headaches.   Hematological: denies adenopathy, easy bruising, personal or family bleeding history.  Psychiatric/ Behavioral: denies suicidal ideation, mood changes, confusion, nervousness, sleep disturbance and agitation.       All other systems are reviewed and negative.    PHYSICAL EXAM: VS:  BP 126/78   Pulse (!) 104   Ht 5\' 9"  (1.753 m)   Wt 225 lb 6.4 oz (102.2 kg)   SpO2 98%   BMI 33.29 kg/m  , BMI Body mass index is 33.29 kg/m. GEN: Well nourished, well developed, in  no acute distress  HEENT: normal  Neck: no JVD, carotid bruits, or masses Cardiac: RRR; no murmurs, rubs, or gallops,no edema  Respiratory:  clear to auscultation bilaterally, normal work of breathing GI: soft, nontender, nondistended, + BS MS: no deformity or atrophy  Skin: warm and dry, no rash Neuro:  Strength and sensation are intact Psych: normal   EKG:  EKG is not ordered today. The ekg ordered 8/27  demonstrates NSR at 75. No ST or T wave changes     Recent Labs: 06/19/2016: B Natriuretic Peptide 33.9; BUN 23; Creatinine, Ser 1.22; Hemoglobin 12.9; Platelets 162; Potassium 5.0; Sodium 132    Lipid Panel No results found for: CHOL, TRIG, HDL, CHOLHDL, VLDL, LDLCALC, LDLDIRECT    Wt Readings from Last 3 Encounters:  07/08/16 225 lb 6.4 oz (102.2 kg)  07/02/15 226 lb (102.5 kg)      Other studies Reviewed: Additional studies/ records that were reviewed today include: . Review of the above records demonstrates:    ASSESSMENT AND PLAN:  1.  Dyspnea:   He has a hx of dyspnea and mild ankle edema. Has improved with Aldactone 100 mg a day . Had a sudden episode of dyspnea and dizziness the morning after taking an Oxydodone tablet. His evaluation in the ER was unremarkable.  He had an echo in July that showed normal LV function.   At this point I have reassured him that the likelihood of him having any significant cardiac disease is very low. He has had an echocardiogram that shows normal LV function.  I have encouraged him to start a walking or exercise program. I've suggested that he join the [facility]. I've advised him to restrict his carbohydrates and to restrict his salt intake. At this point I think that we can see him on an as-needed basis.    Current medicines are reviewed at length with the patient today.  The patient does not have concerns regarding medicines.  Labs/ tests ordered today include:  No orders of the defined types were placed in this encounter.    Disposition:   FU with me as needed.       Mertie Moores, MD  07/08/2016 9:20 AM    [facility] [place], [place], [place]  [address] Phone: [phone]; Fax: [phone]   [facility]  [address] [place], [place]  [address] [phone]   Fax [phone]

## 2017-01-20 DIAGNOSIS — Z8489 Family history of other specified conditions: Secondary | ICD-10-CM | POA: Insufficient documentation

## 2017-01-20 DIAGNOSIS — Z8249 Family history of ischemic heart disease and other diseases of the circulatory system: Secondary | ICD-10-CM | POA: Insufficient documentation

## 2017-05-22 DIAGNOSIS — Z98818 Other dental procedure status: Secondary | ICD-10-CM | POA: Insufficient documentation

## 2017-06-05 ENCOUNTER — Encounter (HOSPITAL_COMMUNITY): Payer: Self-pay

## 2017-06-05 DIAGNOSIS — K7290 Hepatic failure, unspecified without coma: Secondary | ICD-10-CM | POA: Diagnosis present

## 2017-06-05 DIAGNOSIS — D649 Anemia, unspecified: Secondary | ICD-10-CM | POA: Diagnosis present

## 2017-06-05 DIAGNOSIS — K921 Melena: Secondary | ICD-10-CM | POA: Diagnosis present

## 2017-06-05 DIAGNOSIS — K922 Gastrointestinal hemorrhage, unspecified: Secondary | ICD-10-CM | POA: Diagnosis not present

## 2017-06-05 DIAGNOSIS — N281 Cyst of kidney, acquired: Secondary | ICD-10-CM | POA: Diagnosis present

## 2017-06-05 DIAGNOSIS — Z8619 Personal history of other infectious and parasitic diseases: Secondary | ICD-10-CM

## 2017-06-05 DIAGNOSIS — Z885 Allergy status to narcotic agent status: Secondary | ICD-10-CM

## 2017-06-05 DIAGNOSIS — Z6831 Body mass index (BMI) 31.0-31.9, adult: Secondary | ICD-10-CM

## 2017-06-05 DIAGNOSIS — E871 Hypo-osmolality and hyponatremia: Secondary | ICD-10-CM | POA: Diagnosis present

## 2017-06-05 DIAGNOSIS — E669 Obesity, unspecified: Secondary | ICD-10-CM | POA: Diagnosis present

## 2017-06-05 DIAGNOSIS — K5790 Diverticulosis of intestine, part unspecified, without perforation or abscess without bleeding: Secondary | ICD-10-CM | POA: Diagnosis present

## 2017-06-05 DIAGNOSIS — Z888 Allergy status to other drugs, medicaments and biological substances status: Secondary | ICD-10-CM

## 2017-06-05 DIAGNOSIS — E039 Hypothyroidism, unspecified: Secondary | ICD-10-CM | POA: Diagnosis present

## 2017-06-05 DIAGNOSIS — E875 Hyperkalemia: Secondary | ICD-10-CM | POA: Diagnosis present

## 2017-06-05 DIAGNOSIS — K7460 Unspecified cirrhosis of liver: Secondary | ICD-10-CM | POA: Diagnosis present

## 2017-06-05 DIAGNOSIS — D67 Hereditary factor IX deficiency: Principal | ICD-10-CM | POA: Diagnosis present

## 2017-06-05 DIAGNOSIS — N179 Acute kidney failure, unspecified: Secondary | ICD-10-CM | POA: Diagnosis present

## 2017-06-05 LAB — CBC
HCT: 35.1 % — ABNORMAL LOW (ref 39.0–52.0)
Hemoglobin: 12.5 g/dL — ABNORMAL LOW (ref 13.0–17.0)
MCH: 34.7 pg — ABNORMAL HIGH (ref 26.0–34.0)
MCHC: 35.6 g/dL (ref 30.0–36.0)
MCV: 97.5 fL (ref 78.0–100.0)
Platelets: 194 10*3/uL (ref 150–400)
RBC: 3.6 MIL/uL — ABNORMAL LOW (ref 4.22–5.81)
RDW: 12.2 % (ref 11.5–15.5)
WBC: 7.7 10*3/uL (ref 4.0–10.5)

## 2017-06-05 LAB — COMPREHENSIVE METABOLIC PANEL
ALT: 25 U/L (ref 17–63)
AST: 37 U/L (ref 15–41)
Albumin: 3.2 g/dL — ABNORMAL LOW (ref 3.5–5.0)
Alkaline Phosphatase: 43 U/L (ref 38–126)
Anion gap: 8 (ref 5–15)
BUN: 51 mg/dL — ABNORMAL HIGH (ref 6–20)
CO2: 21 mmol/L — ABNORMAL LOW (ref 22–32)
Calcium: 9.3 mg/dL (ref 8.9–10.3)
Chloride: 100 mmol/L — ABNORMAL LOW (ref 101–111)
Creatinine, Ser: 1.54 mg/dL — ABNORMAL HIGH (ref 0.61–1.24)
GFR calc Af Amer: 51 mL/min — ABNORMAL LOW (ref >60)
GFR calc non Af Amer: 44 mL/min — ABNORMAL LOW (ref >60)
Glucose, Bld: 102 mg/dL — ABNORMAL HIGH (ref 65–99)
Potassium: 5.3 mmol/L — ABNORMAL HIGH (ref 3.5–5.1)
Sodium: 129 mmol/L — ABNORMAL LOW (ref 135–145)
Total Bilirubin: 0.9 mg/dL (ref 0.3–1.2)
Total Protein: 6.2 g/dL — ABNORMAL LOW (ref 6.5–8.1)

## 2017-06-05 LAB — TYPE AND SCREEN
ABO/RH(D): O POS
Antibody Screen: NEGATIVE

## 2017-06-05 NOTE — ED Triage Notes (Signed)
Pt reports he has hemophilia B and tonight started having black stool and dizziness. PT reports 2 dark stool pta.

## 2017-06-06 ENCOUNTER — Encounter (HOSPITAL_COMMUNITY): Payer: Self-pay | Admitting: Internal Medicine

## 2017-06-06 ENCOUNTER — Inpatient Hospital Stay (HOSPITAL_COMMUNITY)
Admission: EM | Admit: 2017-06-06 | Discharge: 2017-06-06 | DRG: 813 | Disposition: A | Discharge status: Other Acute Inpatient Hospital | Payer: Medicare Other | Attending: Internal Medicine | Admitting: Internal Medicine

## 2017-06-06 ENCOUNTER — Inpatient Hospital Stay (HOSPITAL_COMMUNITY): Payer: Medicare Other

## 2017-06-06 DIAGNOSIS — E871 Hypo-osmolality and hyponatremia: Secondary | ICD-10-CM

## 2017-06-06 DIAGNOSIS — E669 Obesity, unspecified: Secondary | ICD-10-CM | POA: Diagnosis present

## 2017-06-06 DIAGNOSIS — E875 Hyperkalemia: Secondary | ICD-10-CM | POA: Diagnosis present

## 2017-06-06 DIAGNOSIS — K7460 Unspecified cirrhosis of liver: Secondary | ICD-10-CM | POA: Diagnosis present

## 2017-06-06 DIAGNOSIS — Z885 Allergy status to narcotic agent status: Secondary | ICD-10-CM | POA: Diagnosis not present

## 2017-06-06 DIAGNOSIS — K921 Melena: Secondary | ICD-10-CM | POA: Diagnosis present

## 2017-06-06 DIAGNOSIS — E039 Hypothyroidism, unspecified: Secondary | ICD-10-CM | POA: Diagnosis present

## 2017-06-06 DIAGNOSIS — D649 Anemia, unspecified: Secondary | ICD-10-CM

## 2017-06-06 DIAGNOSIS — K625 Hemorrhage of anus and rectum: Secondary | ICD-10-CM | POA: Diagnosis present

## 2017-06-06 DIAGNOSIS — K922 Gastrointestinal hemorrhage, unspecified: Secondary | ICD-10-CM

## 2017-06-06 DIAGNOSIS — N179 Acute kidney failure, unspecified: Secondary | ICD-10-CM | POA: Diagnosis present

## 2017-06-06 DIAGNOSIS — Z6831 Body mass index (BMI) 31.0-31.9, adult: Secondary | ICD-10-CM | POA: Diagnosis not present

## 2017-06-06 DIAGNOSIS — D67 Hereditary factor IX deficiency: Secondary | ICD-10-CM | POA: Diagnosis present

## 2017-06-06 DIAGNOSIS — N281 Cyst of kidney, acquired: Secondary | ICD-10-CM | POA: Diagnosis present

## 2017-06-06 DIAGNOSIS — K5790 Diverticulosis of intestine, part unspecified, without perforation or abscess without bleeding: Secondary | ICD-10-CM | POA: Diagnosis present

## 2017-06-06 DIAGNOSIS — Z888 Allergy status to other drugs, medicaments and biological substances status: Secondary | ICD-10-CM | POA: Diagnosis not present

## 2017-06-06 DIAGNOSIS — Z8619 Personal history of other infectious and parasitic diseases: Secondary | ICD-10-CM | POA: Diagnosis not present

## 2017-06-06 DIAGNOSIS — K7290 Hepatic failure, unspecified without coma: Secondary | ICD-10-CM | POA: Diagnosis present

## 2017-06-06 HISTORY — DX: Unspecified viral hepatitis C without hepatic coma: B19.20

## 2017-06-06 HISTORY — DX: Hepatic failure, unspecified without coma: K72.90

## 2017-06-06 HISTORY — DX: Hypothyroidism, unspecified: E03.9

## 2017-06-06 LAB — CBC WITH DIFFERENTIAL/PLATELET
Basophils Absolute: 0 10*3/uL (ref 0.0–0.1)
Basophils Relative: 0 %
Eosinophils Absolute: 0.2 10*3/uL (ref 0.0–0.7)
Eosinophils Relative: 2 %
HCT: 30.2 % — ABNORMAL LOW (ref 39.0–52.0)
Hemoglobin: 10.4 g/dL — ABNORMAL LOW (ref 13.0–17.0)
Lymphocytes Relative: 12 %
Lymphs Abs: 1.2 10*3/uL (ref 0.7–4.0)
MCH: 33.8 pg (ref 26.0–34.0)
MCHC: 34.4 g/dL (ref 30.0–36.0)
MCV: 98.1 fL (ref 78.0–100.0)
Monocytes Absolute: 1 10*3/uL (ref 0.1–1.0)
Monocytes Relative: 10 %
Neutro Abs: 7.4 10*3/uL (ref 1.7–7.7)
Neutrophils Relative %: 76 %
Platelets: 184 10*3/uL (ref 150–400)
RBC: 3.08 MIL/uL — ABNORMAL LOW (ref 4.22–5.81)
RDW: 12.5 % (ref 11.5–15.5)
WBC: 9.9 10*3/uL (ref 4.0–10.5)

## 2017-06-06 LAB — COMPREHENSIVE METABOLIC PANEL
ALT: 23 U/L (ref 17–63)
AST: 32 U/L (ref 15–41)
Albumin: 2.9 g/dL — ABNORMAL LOW (ref 3.5–5.0)
Alkaline Phosphatase: 39 U/L (ref 38–126)
Anion gap: 6 (ref 5–15)
BUN: 59 mg/dL — ABNORMAL HIGH (ref 6–20)
CO2: 19 mmol/L — ABNORMAL LOW (ref 22–32)
Calcium: 9.1 mg/dL (ref 8.9–10.3)
Chloride: 103 mmol/L (ref 101–111)
Creatinine, Ser: 1.29 mg/dL — ABNORMAL HIGH (ref 0.61–1.24)
GFR calc Af Amer: >60 mL/min (ref >60)
GFR calc non Af Amer: 54 mL/min — ABNORMAL LOW (ref >60)
Glucose, Bld: 106 mg/dL — ABNORMAL HIGH (ref 65–99)
Potassium: 5.5 mmol/L — ABNORMAL HIGH (ref 3.5–5.1)
Sodium: 128 mmol/L — ABNORMAL LOW (ref 135–145)
Total Bilirubin: 1.1 mg/dL (ref 0.3–1.2)
Total Protein: 5.8 g/dL — ABNORMAL LOW (ref 6.5–8.1)

## 2017-06-06 LAB — POC OCCULT BLOOD, ED: Fecal Occult Bld: POSITIVE — AB

## 2017-06-06 LAB — CBC
HCT: 31.4 % — ABNORMAL LOW (ref 39.0–52.0)
HCT: 31.1 % — ABNORMAL LOW (ref 39.0–52.0)
Hemoglobin: 11.2 g/dL — ABNORMAL LOW (ref 13.0–17.0)
Hemoglobin: 11.1 g/dL — ABNORMAL LOW (ref 13.0–17.0)
MCH: 34.7 pg — ABNORMAL HIGH (ref 26.0–34.0)
MCH: 34.6 pg — ABNORMAL HIGH (ref 26.0–34.0)
MCHC: 35.7 g/dL (ref 30.0–36.0)
MCHC: 35.7 g/dL (ref 30.0–36.0)
MCV: 97.2 fL (ref 78.0–100.0)
MCV: 96.9 fL (ref 78.0–100.0)
Platelets: 163 10*3/uL (ref 150–400)
Platelets: 175 10*3/uL (ref 150–400)
RBC: 3.23 MIL/uL — ABNORMAL LOW (ref 4.22–5.81)
RBC: 3.21 MIL/uL — ABNORMAL LOW (ref 4.22–5.81)
RDW: 12.2 % (ref 11.5–15.5)
RDW: 12.2 % (ref 11.5–15.5)
WBC: 8.6 10*3/uL (ref 4.0–10.5)
WBC: 9.3 10*3/uL (ref 4.0–10.5)

## 2017-06-06 LAB — PROTIME-INR
INR: 1.2
Prothrombin Time: 15.2 seconds (ref 11.4–15.2)

## 2017-06-06 LAB — APTT: aPTT: 51 seconds — ABNORMAL HIGH (ref 24–36)

## 2017-06-06 LAB — MRSA PCR SCREENING: MRSA by PCR: NEGATIVE

## 2017-06-06 MED ORDER — TRAMADOL HCL 50 MG PO TABS: 50.0000 mg | ORAL_TABLET | Freq: Four times a day (QID) | ORAL | Status: DC | PRN

## 2017-06-06 MED ORDER — COAGULATION FACTOR IX (RECOMB) 1000 UNITS IV KIT
8000.0000 [IU] | PACK | Freq: Once | INTRAVENOUS | Status: DC
  Filled 2017-06-06: qty 8000

## 2017-06-06 MED ORDER — SPIRONOLACTONE 25 MG PO TABS
100.0000 mg | ORAL_TABLET | Freq: Every day | ORAL | Status: DC
  Administered 2017-06-06: 100 mg via ORAL
  Filled 2017-06-06: qty 1

## 2017-06-06 MED ORDER — SODIUM CHLORIDE 0.9 % IV BOLUS (SEPSIS)
1000.0000 mL | Freq: Once | INTRAVENOUS | Status: AC
  Administered 2017-06-06: 1000 mL via INTRAVENOUS

## 2017-06-06 MED ORDER — PANTOPRAZOLE SODIUM 40 MG IV SOLR
80.0000 mg | Freq: Once | INTRAVENOUS | Status: AC
  Administered 2017-06-06: 13:00:00 80 mg via INTRAVENOUS
  Filled 2017-06-06 (×2): qty 80

## 2017-06-06 MED ORDER — SODIUM CHLORIDE 0.9 % IV BOLUS (SEPSIS)
500.0000 mL | Freq: Once | INTRAVENOUS | Status: AC
  Administered 2017-06-06: 500 mL via INTRAVENOUS

## 2017-06-06 MED ORDER — SODIUM CHLORIDE 0.9 % IV SOLN
INTRAVENOUS | Status: DC
  Administered 2017-06-06 (×2): via INTRAVENOUS

## 2017-06-06 MED ORDER — COAGULATION FACTOR IX (RECOMB) 1000 UNITS IV KIT
6720.0000 [IU] | PACK | Freq: Once | INTRAVENOUS | Status: AC
  Administered 2017-06-06: 6720 [IU] via INTRAVENOUS
  Filled 2017-06-06: qty 6720

## 2017-06-06 MED ORDER — LEVOTHYROXINE SODIUM 50 MCG PO TABS
50.0000 ug | ORAL_TABLET | Freq: Every day | ORAL | Status: DC
  Administered 2017-06-06: 50 ug via ORAL
  Filled 2017-06-06: qty 1

## 2017-06-06 MED ORDER — LACTULOSE 10 GM/15ML PO SOLN
20.0000 g | Freq: Two times a day (BID) | ORAL | Status: DC
  Administered 2017-06-06 (×2): 20 g via ORAL
  Filled 2017-06-06 (×2): qty 30

## 2017-06-06 MED ORDER — COAGULATION FACTOR IX (RECOMB) 1000 UNITS IV KIT: 6000.0000 [IU] | PACK | Freq: Once | INTRAVENOUS | Status: DC

## 2017-06-06 MED ORDER — SODIUM CHLORIDE 0.9 % IV SOLN
8.0000 mg/h | INTRAVENOUS | Status: DC
  Administered 2017-06-06 (×2): 8 mg/h via INTRAVENOUS
  Filled 2017-06-06 (×4): qty 80

## 2017-06-06 MED ORDER — COAGULATION FACTOR IX (RECOMB) 1000 UNITS IV KIT
1536.0000 [IU] | PACK | Freq: Once | INTRAVENOUS | Status: AC
  Administered 2017-06-06: 1536 [IU] via INTRAVENOUS
  Filled 2017-06-06: qty 1536

## 2017-06-06 NOTE — ED Notes (Signed)
Patient transported to CT 

## 2017-06-06 NOTE — Progress Notes (Signed)
[facility]  Patient admitted after midnight see H&P for further details.  Justin Hampton is a 71 year old male with hemophilia B,  and hepatitis C presented to the emergency department complaining of black stools/melena. Patient was admitted for evaluation of GI bleed after being found FOBT positive with elevated BUN and creatinine, and mild drop in hemoglobin. Patient was placed on Protonix IV, hematologist at [facility] was consulted recommended [facility] and transfer patient to their facility. GI was consulted and recommended transfer as well as. I have contacted [facility] for transfer patient was accepted by Dr Lanell Persons, no further recommendations were made. Awaiting for bed to transfer. In the meantime continue PPIs, check CBCs every 6 hours and continue supportive treatment. Family and patient being informed of the transfer.  Chipper Oman, MD

## 2017-06-06 NOTE — ED Notes (Signed)
Assisted pt with phone to family but speaks easilyt with same.

## 2017-06-06 NOTE — Consult Note (Signed)
[facility] Consult: 10:29 AM 06/06/2017  LOS: 0 days    Referring Provider: Dr Patrecia Pour  Primary Care Physician:  Benjamine Sprague, MD in [facility] Primary Gastroenterologist:  Althia Forts.  [facility]:  Dr Eddie Dibbles Schmeltzer, Dr Arsenio Loader    Reason for Consultation:  Dark, FOBT + stool.     HPI: Justin Hampton is a 71 y.o. male.  PMH Hemophilia B, factor 9 deficiency.  Hypothyroidism.  Hepatitis C, successfully treated.  Cirrhosis since at least 2014.  Hepatic encephalopathy.  Spontaneous intrahepatic shunt.  Pancreas divisum and pancreatic ductal dilatation and stable liver cysts vs hamartomas per MRI 2014.  Renal cysts.  2016 laser treatment to right eye for retinal tear.  Macrocytosis and anemia (Hgb 9.8 in 06/2015) . Thrombocytopenia.  Vitamin D deficiency.  Obesity.   Positive tilt table test 01-07-05 for orthostatic hypotension suggests autonomic neuropathy.  Appendectomy 2002 07/03/2015 EGD for hematemesis.  EGD "normal" per PMD notes and pt report.   12/2011 Colonoscopy, Dr Langston Reusing: for adenoma history/surveillance.  : Diverticulosis.  Rec 5 yr follow up.       Due to diarrhea over the past weekend, the patient stopped taking his lactulose. Normally he has about 3 soft stools daily but the stools have become slightly looser and occurring 5 or 6 times a day. With the discontinuation of lactulose, the diarrhea has resolved. He does not report any increased somnolence or confusion. Yesterday afternoon the patient felt a little bit queasy and lost his appetite. He took a Zofran for this. At about 7 PM and then again at 9 PM last night he passed black, soft stool. He has not had any recurrence of stools since then.  Nausea was not severe but he did have some dry heaves last evening. He called his PMD and was  advised to go to the closest emergency room, which is [place]. Hemoglobin trend since arrival is 12.5 >> 11.1.  WBCs, platelets normal.  (Hgb 05/24/17: 13.2, MCV 102.3.  Platelets 151). Sodium 128. There is some mild renal insufficiency versus AKI.  BUN 59/creatinine 1.5 (23/1.3 on 05/24/17) Patient received factor IX infusion at 3:50 and 4:30 this morning. Protonix bolus and drip initiated. This morning and overnight he has not had any recurrence of stools. He is not having nausea. He feels well. Blood pressures currently but soft with low of 90s/60s. Heart rate is rapid as 110.    Past Medical History:  Diagnosis Date  . 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 Hepatic encephalopathy ([place]) 04/12/2012  . Hepatitis C   . Hypoglycemia   . Hypothyroidism     Past Surgical History:  Procedure Laterality Date  . APPENDECTOMY    . COLONOSCOPY      Prior to Admission medications   Medication Sig Start Date End Date Taking? Authorizing Provider  coagulation factor IX, recomb, (BENEFIX) 1000 units injection Inject 3,500-7,500 Units into the vein See admin instructions. As directed by hematologist 07/28/16  Yes [provider]  lactulose (CHRONULAC) 10 GM/15ML solution Take  30 mLs by mouth 2 (two) times daily. 03/16/17  Yes [provider]  levothyroxine (SYNTHROID, LEVOTHROID) 50 MCG tablet Take 50 mcg by mouth daily.   Yes [provider]  spironolactone (ALDACTONE) 100 MG tablet Take 100 mg by mouth daily.  05/04/16  Yes [provider]    Scheduled Meds: . lactulose  20 g Oral BID  . levothyroxine  50 mcg Oral Daily  . spironolactone  100 mg Oral Daily   Infusions: . sodium chloride 50 mL/hr at 06/06/17 0753  . pantoprazole (PROTONIX) IVPB    . pantoprozole (PROTONIX) infusion     PRN Meds: traMADol   Allergies as of 06/05/2017 - Review Complete 06/05/2017  Allergen Reaction Noted  . Morphine  10/03/2011  . Zolpidem   10/03/2011    Family History  Problem Relation Age of Onset  . Dementia Mother   . Hypertension Father   . Heart attack Father   Positive for:  Sister diagnosed with uterine cancer 2015 heart disease: mother, father  Negative for: hypertension, Stroke/TIA, cancer, diabetes  Additional Family History Notes: brother with factor IX deficiency and gastric ulcers and diabetes. Dad died of MI age 11. Had HTN  Mother had a dx of CHF. she died of dementia 04/27/2015 of dementia.      Social History   Social History  . Marital status: Married    Spouse name: N/A  . Number of children: N/A  . Years of education: N/A   Occupational History  . Not on file.      [employer] manager/land agent at [employer] History Main Topics  . Smoking status: Never Smoker  . Smokeless tobacco: Never Used  . Alcohol use No  . Drug use: No  . Sexual activity: Not on file   Other Topics Concern  . Not on file   Social History Narrative  . No narrative on file    REVIEW OF SYSTEMS: Constitutional:  No weakness, no fatigue. ENT:  No nose bleeds Pulm:  No shortness of breath or cough. CV:  No chest pain. No palpitations, no LE edema.  GU:  No hematuria, no frequency GI:  Per HPI.  No heartburn, no dysphagia, no indigestion. No constipation. Heme:  No unusual bleeding or bruising in recent weeks. He only receives factor IX infusions when he's had issues with bleeding of any sort or during procedure such as tooth extractions   Neuro:  No dizziness. No headaches, no peripheral tingling or numbness Derm:  For few months has had intermittent pruritic rash/eruption on his upper back. Endocrine:  No sweats or chills.  No polyuria or dysuria Immunization:  Reviewed in [facility] note of 05/24/17.  Flu, tdap, pneumovax mentioned but hep A/B not mentioned.  Travel:  None beyond local counties in last few months.    PHYSICAL EXAM: Vital signs in last 24 hours: Vitals:   06/06/17 0945 06/06/17  1000  BP: 97/60 105/73  Pulse: 85 80  Resp: 16 16  Temp:    SpO2: 100% 100%   Wt Readings from Last 3 Encounters:  06/05/17 102.1 kg (225 lb)  07/08/16 102.2 kg (225 lb 6.4 oz)  07/02/15 102.5 kg (226 lb)    General: looks well Head:  No signs of head trauma. No asymmetry  Eyes:  No scleral icterus. No conjunctival pallor. Ears:  Not hard of hearing  Nose:  No congestion or discharge Mouth:  Moist, clear oral mucosa. Good dentition. Tongue midline. Neck:  No  JVD, no thyromegaly, no masses. Lungs:  Clear bilaterally. Unlabored breathing. No cough Heart: RRR. No MRG. S1, S2 present Abdomen:  Often. Not tender or distended. No HSM or masses..   Rectal: Black stool is 3 plus FOBT positive. Masses.   Musc/Skeltl: No joint swelling, erythema or gross deformities. Extremities:  No CCE.  Neurologic:  Oriented times 3. No tremor, no limb weakness. Skin:  Some limited erythematous eruption on the upper back.  No telangiectasia Tattoos:  None visualized Nodes:  No cervical adenopathy.   Psych:  Pleasant, cooperative. Calm  Intake/Output from previous day: 08/13 0701 - 08/14 0700 In: 1000 [IV Piggyback:1000] Out: 200 [Urine:200] Intake/Output this shift: Total I/O In: -  Out: 300 [Urine:300]  LAB RESULTS:  Recent Labs  06/05/17 2315 06/06/17 0528 06/06/17 0930  WBC 7.7 8.6 9.3  HGB 12.5* 11.2* 11.1*  HCT 35.1* 31.4* 31.1*  PLT 194 163 175   BMET Lab Results  Component Value Date   NA 128 (L) 06/06/2017   NA 129 (L) 06/05/2017   NA 132 (L) 06/19/2016   K 5.5 (H) 06/06/2017   K 5.3 (H) 06/05/2017   K 5.0 06/19/2016   CL 103 06/06/2017   CL 100 (L) 06/05/2017   CL 106 06/19/2016   CO2 19 (L) 06/06/2017   CO2 21 (L) 06/05/2017   CO2 22 06/19/2016   GLUCOSE 106 (H) 06/06/2017   GLUCOSE 102 (H) 06/05/2017   GLUCOSE 103 (H) 06/19/2016   BUN 59 (H) 06/06/2017   BUN 51 (H) 06/05/2017   BUN 23 (H) 06/19/2016   CREATININE 1.29 (H) 06/06/2017   CREATININE 1.54 (H)  06/05/2017   CREATININE 1.22 06/19/2016   CALCIUM 9.1 06/06/2017   CALCIUM 9.3 06/05/2017   CALCIUM 9.1 06/19/2016   LFT  Recent Labs  06/05/17 2315 06/06/17 0930  PROT 6.2* 5.8*  ALBUMIN 3.2* 2.9*  AST 37 32  ALT 25 23  ALKPHOS 43 39  BILITOT 0.9 1.1   PT/INR Lab Results  Component Value Date   INR 1.20 06/05/2017   INR 1.23 07/02/2015   Hepatitis Panel No results for input(s): HEPBSAG, HCVAB, HEPAIGM, HEPBIGM in the last 72 hours. C-Diff No components found for: CDIFF Lipase     Component Value Date/Time   LIPASE 22 07/02/2015 1741    Drugs of Abuse  No results found for: LABOPIA, COCAINSCRNUR, LABBENZ, AMPHETMU, THCU, LABBARB   RADIOLOGY STUDIES: Ct Head Wo Contrast  Result Date: 06/06/2017 CLINICAL DATA:  Head injury after fall.  No loss of consciousness. EXAM: CT HEAD WITHOUT CONTRAST TECHNIQUE: Contiguous axial images were obtained from the base of the skull through the vertex without intravenous contrast. COMPARISON:  CT scan of May 21, 2016. FINDINGS: Brain: Mild diffuse cortical atrophy is noted. Mild chronic ischemic white matter disease is noted. No mass effect or midline shift is noted. Ventricular size is within normal limits. There is no evidence of mass lesion, hemorrhage or acute infarction. Vascular: No hyperdense vessel or unexpected calcification. Skull: Normal. Negative for fracture or focal lesion. Sinuses/Orbits: No acute finding. Other: None. IMPRESSION: Mild diffuse cortical atrophy. Mild chronic ischemic white matter disease. No acute intracranial abnormality seen. Electronically Signed   By: Marijo Conception, M.D.   On: 06/06/2017 07:16     IMPRESSION:   *  GI Bleed.  R/o ulcers, vs AVMs, vs portal gastropathy/varices.  Though none of these seen on 06/2015 EGD.    *  Factor 9 deficient, hemophilia.  S/p 2 infusions  of Factor 9    PLAN:     *  Best option is to transfer pt to [facility], where his hematologist and GI specialist practice.  If  EGD here, Dr Fuller Plan needs hematology guidance as to how to proceed with endoscopic intervention (does he need additional factor 9 infusion?).  I am placing call to Dr Earlie Server to make sure he is planning to see pt.    Addendum 1200:  Just got off the phone with Dr. Earlie Server who is covering hematology consultations today. He informs me that they do not manage hemophiliac patients here in [place] and that patient should be transferred to [place]  06/06/2017, 10:29 AM Pager: [phone]     Attending physician's note   I have taken a history, examined the patient and reviewed the chart. I agree with the Advanced Practitioner's note, impression and recommendations. Agree with recommendations to transfer to [facility] for mgmt. IV PPI infusion, trend CBC, transfuse to maintain Hb > 8 and request advice from his Hematologist at [facility] regarding mgmt. for now.     Lucio Edward, MD [facility] [phone] Mon-Fri 8a-5p [phone] after 5p, weekends, holidays

## 2017-06-06 NOTE — Progress Notes (Signed)
Pt. Was transferred to [facility]. Pt had his belongings (clothing). Pt was transported via [facility] with IV infusions of Protonix and NS. Report was called to nurse receiving pt and all questions were answered.

## 2017-06-06 NOTE — ED Notes (Signed)
Pt had large, black stool

## 2017-06-06 NOTE — ED Provider Notes (Signed)
Pt sustained accidental fall while nurse was helping in the room Pt reports he did hit his head but no HA He also hit his left elbow but no pain reported Will obtain CT head due to h/o hemophilia B Will also recheck CBC   Ripley Fraise, MD 06/06/17 [phone]

## 2017-06-06 NOTE — ED Notes (Signed)
ED Provider at bedside. 

## 2017-06-06 NOTE — Progress Notes (Signed)
Pts VS stable at transfer

## 2017-06-06 NOTE — H&P (Addendum)
TRH H&P   Patient Demographics:    Justin Hampton, is a 71 y.o. male  MRN: [id number]   DOB - 06-Feb-1946  Admit Date - 06/06/2017  Outpatient Primary MD for the patient is Benjamine Sprague, MD  Referring MD/NP/PA: Ripley Fraise  Outpatient Specialists: [facility]  Patient coming from: home  Chief Complaint  Patient presents with  . GI Bleeding      HPI:    Justin Hampton  is a 71 y.o. male, w hemophilia B, Hepatitis C, Hepatic encephalopathy apparently c/o black stool.  Started about 7pm last nite.  Pt had 2 episodes and then presented to the ED.  Pt denies heartburn, abd pain, brbpr.  Pt noted diarrhea about 2-3 days ago and decreased his lactulose.  Took an Aleve about 2 days ago.  Pt denies aspirin use.  Pt presented to ED for evaluation of black stool.    In ED, Hgb 12.5,  Pt found to have FOBT +, Bun/creat 51/1.54, and Na=129, K=5.3    Pt will be admitted for possible GI bleeding.      Review of systems:    In addition to the HPI above,  No Fever-chills, No Headache, No changes with Vision or hearing, No problems swallowing food or Liquids, No Chest pain, Cough or Shortness of Breath, No Abdominal pain, No Nausea or Vommitting, No Blood in Urine, No dysuria, No new skin rashes or bruises, No new joints pains-aches,  No new weakness, tingling, numbness in any extremity, No recent weight gain or loss, No polyuria, polydypsia or polyphagia, No significant Mental Stressors.  A full 10 point Review of Systems was done, except as stated above, all other Review of Systems were negative.   With Past History of the following :    Past Medical History:  Diagnosis Date  . 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 Hepatic encephalopathy ([place]) 04/12/2012  . Hepatitis C   . Hypoglycemia   . Hypothyroidism       Past Surgical History:    Procedure Laterality Date  . APPENDECTOMY    . COLONOSCOPY        Social History:     Social History  Substance Use Topics  . Smoking status: Never Smoker  . Smokeless tobacco: Never Used  . Alcohol use No     Lives - at home w wife and 2 dogs  Mobility - walks by self     Family History :     Family History  Problem Relation Age of Onset  . Dementia Mother   . Hypertension Father   . Heart attack Father      Home Medications:   Prior to Admission medications   Medication Sig Start Date End Date Taking? Authorizing Provider  coagulation factor IX, recomb, (BENEFIX) 1000 units injection Inject 3,500-7,500 Units into the vein See admin  instructions. As directed by hematologist 07/28/16  Yes [provider]  lactulose (CHRONULAC) 10 GM/15ML solution Take 30 mLs by mouth 2 (two) times daily. 03/16/17  Yes [provider]  levothyroxine (SYNTHROID, LEVOTHROID) 50 MCG tablet Take 50 mcg by mouth daily.   Yes [provider]  spironolactone (ALDACTONE) 100 MG tablet Take 100 mg by mouth daily.  05/04/16  Yes [provider]     Allergies:     Allergies  Allergen Reactions  . Morphine     Other reaction(s): Confusion (intolerance) During appendectomy  . Zolpidem     Other reaction(s): Confusion (intolerance)     Physical Exam:   Vitals  Blood pressure 103/75, pulse 89, temperature 97.8 F (36.6 C), temperature source Oral, resp. rate 18, weight 102.1 kg (225 lb), SpO2 99 %.   1. General  lying in bed in NAD,    2. Normal affect and insight, Not Suicidal or Homicidal, Awake Alert, Oriented X 3.  3. No F.N deficits, ALL C.Nerves Intact, Strength 5/5 all 4 extremities, Sensation intact all 4 extremities, Plantars down going.  4. Ears and Eyes appear Normal, Conjunctivae clear, PERRLA. Moist Oral Mucosa.  5. Supple Neck, No JVD, No cervical lymphadenopathy appriciated, No Carotid Bruits.  6. Symmetrical Chest wall  movement, Good air movement bilaterally, CTAB.  7. RRR, No Gallops, Rubs or Murmurs, No Parasternal Heave.  8. Positive Bowel Sounds, Abdomen Soft, No tenderness, No organomegaly appriciated,No rebound -guarding or rigidity.  9.  No Cyanosis, Normal Skin Turgor, No Skin Rash or Bruise.  10. Good muscle tone,  joints appear normal , no effusions, Normal ROM.  11. No Palpable Lymph Nodes in Neck or Axillae  No palmar erythema, no asterixis,  No scleral icterus   Data Review:    CBC  Recent Labs Lab 06/05/17 2315  WBC 7.7  HGB 12.5*  HCT 35.1*  PLT 194  MCV 97.5  MCH 34.7*  MCHC 35.6  RDW 12.2   ------------------------------------------------------------------------------------------------------------------  Chemistries   Recent Labs Lab 06/05/17 2315  NA 129*  K 5.3*  CL 100*  CO2 21*  GLUCOSE 102*  BUN 51*  CREATININE 1.54*  CALCIUM 9.3  AST 37  ALT 25  ALKPHOS 43  BILITOT 0.9   ------------------------------------------------------------------------------------------------------------------ CrCl cannot be calculated (Unknown ideal weight.). ------------------------------------------------------------------------------------------------------------------ No results for input(s): TSH, T4TOTAL, T3FREE, THYROIDAB in the last 72 hours.  Invalid input(s): FREET3  Coagulation profile  Recent Labs Lab 06/05/17 2315  INR 1.20   ------------------------------------------------------------------------------------------------------------------- No results for input(s): DDIMER in the last 72 hours. -------------------------------------------------------------------------------------------------------------------  Cardiac Enzymes No results for input(s): CKMB, TROPONINI, MYOGLOBIN in the last 168 hours.  Invalid input(s): CK ------------------------------------------------------------------------------------------------------------------    Component Value  Date/Time   BNP 33.9 06/19/2016 1049     ---------------------------------------------------------------------------------------------------------------  Urinalysis    Component Value Date/Time   COLORURINE YELLOW 07/02/2015 1850   APPEARANCEUR CLEAR 07/02/2015 1850   LABSPEC 1.016 07/02/2015 1850   PHURINE 6.5 07/02/2015 1850   GLUCOSEU NEGATIVE 07/02/2015 1850   HGBUR SMALL (A) 07/02/2015 1850   BILIRUBINUR NEGATIVE 07/02/2015 1850   KETONESUR NEGATIVE 07/02/2015 1850   PROTEINUR NEGATIVE 07/02/2015 1850   UROBILINOGEN 1.0 07/02/2015 1850   NITRITE NEGATIVE 07/02/2015 1850   LEUKOCYTESUR NEGATIVE 07/02/2015 1850    ----------------------------------------------------------------------------------------------------------------   Imaging Results:    No results found.   Assessment & Plan:    Principal Problem:   Rectal bleeding Active Problems:   Acute renal failure (ARF) (HCC)   Hyponatremia  Hyperkalemia   Anemia    GI bleeding (black stool) "NO MORE NSAIDS" NPO protonix 80mg  iv x1 then 8mg  /hour Dr. Henrene Pastor is aware of patient in case decompensation overnite.  Please transfer patient to [facility] in AM when there are beds available, Dr. Lanell Persons is willing to accept patient Possible EGD at [facility] for evaluation of UGI bleeding   Hemophilia B ED discussed care w Dr. Lanell Persons (hematology ) w [facility] and recommended apparentlyl Benefix.  Which has been ordered by ED.   Anemia Check cbc in am  Hyponatremia Mild ? Secondary to spironolactone Check cmp in am  Hyperkalemia Check cmp in am If persistent might have to adjust spironolactone or add lasix  ARF Ns iv 67mL per hour x 1 day Check cmp in am  Cirrhosis/ hepatic encephalopathy Cont spironolactone Cont lactulose      DVT Prophylaxis  SCDs   AM Labs Ordered, also please review Full Orders  Family Communication: Admission, patients condition and plan of care including tests being ordered have  been discussed with the patient who indicate understanding and agree with the plan and Code Status.  Code Status FULL CODE  Likely DC to  home  Condition GUARDED    Consults called: hematology at [facility] by ED, GI by ED  Admission status: inpatient  Time spent in minutes : 45 minutes   Jani Gravel M.D on 06/06/2017 at 2:59 AM  Between 7am to 7pm - Pager - [phone]. After 7pm go to [email or web address] - password [email or web address]  [facility] - Office  [phone]

## 2017-06-06 NOTE — ED Provider Notes (Signed)
[facility] DEPT Provider Note   CSN: [id number] Arrival date & time: 06/05/17  2252     History   Chief Complaint Chief Complaint  Patient presents with  . GI Bleeding    HPI Justin Hampton is a 71 y.o. male.  The history is provided by the patient.  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 Hypoglycemia     Patient Active Problem List   Diagnosis Date Noted  . Dyspnea 07/08/2016    Past Surgical History:  Procedure Laterality Date  . APPENDECTOMY         Home Medications    Prior to Admission medications   Medication Sig Start Date End Date Taking? Authorizing Provider  levothyroxine (SYNTHROID, LEVOTHROID) 50 MCG tablet Take 50 mcg by mouth daily.    [provider]  spironolactone (ALDACTONE) 100 MG tablet Take 100 mg by mouth daily.  05/04/16   [provider]    Family History Family History  Problem Relation Age of Onset  . Dementia Mother   . Hypertension Father   . Heart attack Father     Social History Social History  Substance Use Topics  . Smoking status: Never Smoker  . Smokeless tobacco: Never Used  . Alcohol use No     Allergies   Morphine and Zolpidem   Review of Systems Review of Systems    Constitutional: Negative for fever.  Gastrointestinal: Positive for blood in stool and hematochezia. Negative for abdominal pain, hematemesis and vomiting.  Neurological: Positive for dizziness. Negative for tremors and loss of consciousness.  All other systems reviewed and are negative.    Physical Exam Updated Vital Signs BP 122/75 (BP Location: Left Arm)   Pulse (!) 110   Temp 97.8 F (36.6 C) (Oral)   Resp 18   Wt 102.1 kg (225 lb)   SpO2 100%   BMI 33.23 kg/m   Physical Exam CONSTITUTIONAL: Well developed/well nourished HEAD: Normocephalic/atraumatic EYES: EOMI ENMT: Mucous membranes moist NECK: supple no meningeal signs SPINE/BACK:no cervical spine tendernes CV: S1/S2 noted, no murmurs/rubs/gallops noted LUNGS: Lungs are clear to auscultation bilaterally, no apparent distress ABDOMEN: soft, nontender, no rebound or guarding, bowel sounds noted throughout abdomen Rectal - stool color black, chaperone Mandi present for exam GU:no cva tenderness NEURO: Pt is awake/alert/appropriate, moves all extremitiesx4.  No facial droop.   EXTREMITIES: pulses normal/equal, full ROM SKIN: pale PSYCH: no abnormalities of mood noted, alert and oriented to situation   ED Treatments / Results  Labs (all labs ordered are listed, but only abnormal results are displayed) Labs Reviewed  COMPREHENSIVE METABOLIC PANEL - Abnormal; Notable for the following:       Result Value   Sodium 129 (*)    Potassium 5.3 (*)  Chloride 100 (*)    CO2 21 (*)    Glucose, Bld 102 (*)    BUN 51 (*)    Creatinine, Ser 1.54 (*)    Total Protein 6.2 (*)    Albumin 3.2 (*)    GFR calc non Af Amer 44 (*)    GFR calc Af Amer 51 (*)    All other components within normal limits  CBC - Abnormal; Notable for the following:    RBC 3.60 (*)    Hemoglobin 12.5 (*)    HCT 35.1 (*)    MCH 34.7 (*)    All other components within normal limits  APTT - Abnormal; Notable for the following:    aPTT 51 (*)     All other components within normal limits  POC OCCULT BLOOD, ED - Abnormal; Notable for the following:    Fecal Occult Bld POSITIVE (*)    All other components within normal limits  PROTIME-INR  POC OCCULT BLOOD, ED  TYPE AND SCREEN    EKG  EKG Interpretation None       Radiology No results found.  Procedures Procedures    Medications Ordered in ED Medications  coagulation factor IX (recomb) (BENEFIX) injection 6,000 Units (not administered)  sodium chloride 0.9 % bolus 1,000 mL (1,000 mLs Intravenous New Bag/Given 06/06/17 0224)     Initial Impression / Assessment and Plan / ED Course  I have reviewed the triage vital signs and the nursing notes.  Pertinent labs & imaging results that were available during my care of the patient were reviewed by me and considered in my medical decision making (see chart for details).     1:45 AM Plan to consult his hematologist at [facility] Will give IV fluids HGB is stable BP is appropriate Will continue to monitor 2:22 AM D/w dr Lanell Persons at [facility] Hematology We discussed case He recommends Benefix 8,000 units He would like to transfer to [facility], however no beds available Plan to admit at [facility], and will be transferred to [facility] once beds available  2:33 AM D/w dr Henrene Pastor with GI - aware of patient and will be seen as inpatient D/w dr Maudie Mercury - admit to [facility], and will transfer to [facility] once bed available  Final Clinical Impressions(s) / ED Diagnoses   Final diagnoses:  Acute GI bleeding  Lower GI bleed  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[place])    New Prescriptions New Prescriptions   No medications on file     Ripley Fraise, MD 06/06/17 [id number]

## 2017-06-07 SURGERY — EGD (ESOPHAGOGASTRODUODENOSCOPY)
Anesthesia: Monitor Anesthesia Care

## 2017-06-16 NOTE — Discharge Summary (Signed)
Physician Discharge Summary  Justin Hampton  JHE:[id number]  DOB: 25-Oct-1945  DOA: 06/06/2017 PCP: Benjamine Sprague, MD  Admit date: 06/06/2017 Discharge date: 06/16/2017  Admitted From: Home  Disposition: Transferred to [facility]   Discharge Condition: Stable  CODE STATUS: Full  Brief/Interim Summary: For full details see H&P but in brief. Justin Hampton is a 71 year old male with hemophilia B,  and hepatitis C presented to the emergency department complaining of black stools/melena. Patient was admitted for evaluation of GI bleed after being found FOBT positive with elevated BUN and creatinine, and mild drop in hemoglobin. Patient was placed on Protonix IV, hematologist at [facility] was consulted recommended Benefix and transfer patient to their facility. GI was consulted and recommended transfer as well as in this facility we don't have the capability of treating patients with hemophilia, [facility] hospital was contacted and they accepted for transfer. While awaiting for transfer patients hemoglobin was monitored and remained stable, he was also treated with IVF for mild AKI.   Discharge Diagnoses/Hospital Course:  Principal Problem:   Rectal bleeding Active Problems:   Acute renal failure (ARF) (HCC)   Hyponatremia   Hyperkalemia   Anemia   GI bleeding Hemophillia B  Hepatitis    Discharge Instructions  You were cared for by a hospitalist during your hospital stay. If you have any questions about your discharge medications or the care you received while you were in the hospital after you are discharged, you can call the unit and asked to speak with the hospitalist on call if the hospitalist that took care of you is not available. Once you are discharged, your primary care physician will handle any further medical issues. Please note that NO REFILLS for any discharge medications will be authorized once you are discharged, as it is imperative that you return to your primary care  physician (or establish a relationship with a primary care physician if you do not have one) for your aftercare needs so that they can reassess your need for medications and monitor your lab values.   Allergies as of 06/06/2017      Reactions   Morphine    Other reaction(s): Confusion (intolerance) During appendectomy   Zolpidem    Other reaction(s): Confusion (intolerance)      Medication List    TAKE these medications   BENEFIX 1000 units injection Generic drug:  coagulation factor IX (recomb) Inject 3,500-7,500 Units into the vein See admin instructions. As directed by hematologist   lactulose 10 GM/15ML solution Commonly known as:  CHRONULAC Take 30 mLs by mouth 2 (two) times daily.   levothyroxine 50 MCG tablet Commonly known as:  SYNTHROID, LEVOTHROID Take 50 mcg by mouth daily.   spironolactone 100 MG tablet Commonly known as:  ALDACTONE Take 100 mg by mouth daily.       Allergies  Allergen Reactions  . Morphine     Other reaction(s): Confusion (intolerance) During appendectomy  . Zolpidem     Other reaction(s): Confusion (intolerance)    Consultations: GI  Procedures/Studies: Ct Head Wo Contrast  Result Date: 06/06/2017 CLINICAL DATA:  Head injury after fall.  No loss of consciousness. EXAM: CT HEAD WITHOUT CONTRAST TECHNIQUE: Contiguous axial images were obtained from the base of the skull through the vertex without intravenous contrast. COMPARISON:  CT scan of May 21, 2016. FINDINGS: Brain: Mild diffuse cortical atrophy is noted. Mild chronic ischemic white matter disease is noted. No mass effect or midline shift is noted. Ventricular size is within normal limits.  There is no evidence of mass lesion, hemorrhage or acute infarction. Vascular: No hyperdense vessel or unexpected calcification. Skull: Normal. Negative for fracture or focal lesion. Sinuses/Orbits: No acute finding. Other: None. IMPRESSION: Mild diffuse cortical atrophy. Mild chronic ischemic white  matter disease. No acute intracranial abnormality seen. Electronically Signed   By: Marijo Conception, M.D.   On: 06/06/2017 07:16      Discharge Exam: Vitals:   06/06/17 1600 06/06/17 2007  BP:    Pulse: 87   Resp: 15   Temp:  98.1 F (36.7 C)  SpO2: 100%    Vitals:   06/06/17 1430 06/06/17 1500 06/06/17 1600 06/06/17 2007  BP: (!) 118/99 (!) 117/58    Pulse: 85 79 87   Resp: 18 17 15    Temp:  98 F (36.7 C)  98.1 F (36.7 C)  TempSrc:    Oral  SpO2: 100% 100% 100%   Weight:  98.4 kg (217 lb)    Height:  5' 9.5" (1.765 m)      General: Pt is alert, awake, not in acute distress Cardiovascular: RRR, S1/S2 +, no rubs, no gallops Respiratory: CTA bilaterally, no wheezing, no rhonchi Abdominal: Soft, NT, ND, bowel sounds + Extremities: no edema, no cyanosis  The results of significant diagnostics from this hospitalization (including imaging, microbiology, ancillary and laboratory) are listed below for reference.     Microbiology: Recent Results (from the past 240 hour(s))  MRSA PCR Screening     Status: None   Collection Time: 06/06/17  6:22 PM  Result Value Ref Range Status   MRSA by PCR NEGATIVE NEGATIVE Final    Comment:        The GeneXpert MRSA Assay (FDA approved for NASAL specimens only), is one component of a comprehensive MRSA colonization surveillance program. It is not intended to diagnose MRSA infection nor to guide or monitor treatment for MRSA infections.      Labs: BNP (last 3 results)  Recent Labs  06/19/16 1049  BNP 36.1   Basic Metabolic Panel: No results for input(s): NA, K, CL, CO2, GLUCOSE, BUN, CREATININE, CALCIUM, MG, PHOS in the last 168 hours. Liver Function Tests: No results for input(s): AST, ALT, ALKPHOS, BILITOT, PROT, ALBUMIN in the last 168 hours. No results for input(s): LIPASE, AMYLASE in the last 168 hours. No results for input(s): AMMONIA in the last 168 hours. CBC: No results for input(s): WBC, NEUTROABS, HGB, HCT,  MCV, PLT in the last 168 hours. Cardiac Enzymes: No results for input(s): CKTOTAL, CKMB, CKMBINDEX, TROPONINI in the last 168 hours. BNP: Invalid input(s): POCBNP CBG: No results for input(s): GLUCAP in the last 168 hours. D-Dimer No results for input(s): DDIMER in the last 72 hours. Hgb A1c No results for input(s): HGBA1C in the last 72 hours. Lipid Profile No results for input(s): CHOL, HDL, LDLCALC, TRIG, CHOLHDL, LDLDIRECT in the last 72 hours. Thyroid function studies No results for input(s): TSH, T4TOTAL, T3FREE, THYROIDAB in the last 72 hours.  Invalid input(s): FREET3 Anemia work up No results for input(s): VITAMINB12, FOLATE, FERRITIN, TIBC, IRON, RETICCTPCT in the last 72 hours. Urinalysis    Component Value Date/Time   COLORURINE YELLOW 07/02/2015 1850   APPEARANCEUR CLEAR 07/02/2015 1850   LABSPEC 1.016 07/02/2015 1850   PHURINE 6.5 07/02/2015 1850   GLUCOSEU NEGATIVE 07/02/2015 1850   HGBUR SMALL (A) 07/02/2015 1850   BILIRUBINUR NEGATIVE 07/02/2015 1850   KETONESUR NEGATIVE 07/02/2015 1850   PROTEINUR NEGATIVE 07/02/2015 1850   UROBILINOGEN 1.0  07/02/2015 1850   NITRITE NEGATIVE 07/02/2015 1850   LEUKOCYTESUR NEGATIVE 07/02/2015 1850   Sepsis Labs Invalid input(s): PROCALCITONIN,  WBC,  LACTICIDVEN Microbiology Recent Results (from the past 240 hour(s))  MRSA PCR Screening     Status: None   Collection Time: 06/06/17  6:22 PM  Result Value Ref Range Status   MRSA by PCR NEGATIVE NEGATIVE Final    Comment:        The GeneXpert MRSA Assay (FDA approved for NASAL specimens only), is one component of a comprehensive MRSA colonization surveillance program. It is not intended to diagnose MRSA infection nor to guide or monitor treatment for MRSA infections.      Time coordinating discharge: 15 minutes  SIGNED:  Chipper Oman, MD  [facility] 06/16/2017, 9:12 AM  Pager please text [phone] via  [email or web address] Password [email or web address]

## 2017-09-12 DIAGNOSIS — D500 Iron deficiency anemia secondary to blood loss (chronic): Secondary | ICD-10-CM | POA: Insufficient documentation

## 2018-03-01 ENCOUNTER — Emergency Department (HOSPITAL_COMMUNITY): Payer: Medicare Other

## 2018-03-01 ENCOUNTER — Emergency Department (HOSPITAL_COMMUNITY)
Admission: EM | Admit: 2018-03-01 | Discharge: 2018-03-02 | Disposition: A | Discharge status: Home / Self Care | Payer: Medicare Other | Attending: Emergency Medicine | Admitting: Emergency Medicine

## 2018-03-01 ENCOUNTER — Encounter (HOSPITAL_COMMUNITY): Payer: Self-pay | Admitting: Emergency Medicine

## 2018-03-01 ENCOUNTER — Other Ambulatory Visit: Payer: Self-pay

## 2018-03-01 DIAGNOSIS — E039 Hypothyroidism, unspecified: Secondary | ICD-10-CM | POA: Insufficient documentation

## 2018-03-01 DIAGNOSIS — Y929 Unspecified place or not applicable: Secondary | ICD-10-CM | POA: Diagnosis not present

## 2018-03-01 DIAGNOSIS — S0990XA Unspecified injury of head, initial encounter: Secondary | ICD-10-CM | POA: Diagnosis present

## 2018-03-01 DIAGNOSIS — Z23 Encounter for immunization: Secondary | ICD-10-CM | POA: Diagnosis not present

## 2018-03-01 DIAGNOSIS — Y939 Activity, unspecified: Secondary | ICD-10-CM | POA: Insufficient documentation

## 2018-03-01 DIAGNOSIS — S022XXA Fracture of nasal bones, initial encounter for closed fracture: Secondary | ICD-10-CM | POA: Diagnosis not present

## 2018-03-01 DIAGNOSIS — Y999 Unspecified external cause status: Secondary | ICD-10-CM | POA: Diagnosis not present

## 2018-03-01 DIAGNOSIS — W19XXXA Unspecified fall, initial encounter: Secondary | ICD-10-CM | POA: Diagnosis not present

## 2018-03-01 DIAGNOSIS — Z79899 Other long term (current) drug therapy: Secondary | ICD-10-CM | POA: Diagnosis not present

## 2018-03-01 DIAGNOSIS — T148XXA Other injury of unspecified body region, initial encounter: Secondary | ICD-10-CM

## 2018-03-01 MED ORDER — TETANUS-DIPHTH-ACELL PERTUSSIS 5-2.5-18.5 LF-MCG/0.5 IM SUSP: 0.5000 mL | Freq: Once | INTRAMUSCULAR | Status: DC

## 2018-03-01 MED ORDER — IBUPROFEN 400 MG PO TABS: 400.0000 mg | ORAL_TABLET | Freq: Four times a day (QID) | ORAL | 0 refills | Status: DC | PRN

## 2018-03-01 MED ORDER — METHOCARBAMOL 500 MG PO TABS: 500.0000 mg | ORAL_TABLET | Freq: Two times a day (BID) | ORAL | 0 refills | Status: DC

## 2018-03-01 MED ORDER — TETANUS-DIPHTHERIA TOXOIDS TD 5-2 LFU IM INJ
0.5000 mL | INJECTION | Freq: Once | INTRAMUSCULAR | Status: AC
  Administered 2018-03-01: 0.5 mL via INTRAMUSCULAR
  Filled 2018-03-01: qty 0.5

## 2018-03-01 NOTE — ED Provider Notes (Addendum)
[facility] DEPART[facility] Provider Note   CSN: [id number] Arrival date & time: 03/01/18  1439     History   Chief Complaint Chief Complaint  Patient presents with  . Fall    HPI Justin Hampton is a 72 y.o. male.  72 year old male here after mechanical fall just prior to arrival.  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  Patient did not have any loss of consciousness.  Does complain of some pain to his nose, left hip, right wrist.  Denies any chest or abdominal discomfort.  Denies any neck pain.  No weakness in his upper or lower extremities.  Has not had any confusion, vomiting.  Pain at his right wrist is dull and worse with any movement.  Does have a prior history of a fracture there.  Pain in his left hip is sharp and worse with standing.  Denies any radicular symptoms.  Has not had any epistaxis from his nose.  No treatment used prior to arrival.     Past Medical History:  Diagnosis Date  . 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 Hepatic encephalopathy ([place]) 04/12/2012  . Hepatitis C   . Hypoglycemia   . Hypothyroidism     Patient Active Problem List   Diagnosis Date Noted  . Rectal bleeding 06/06/2017  . Acute renal failure (ARF) ([place]) 06/06/2017  . Hyponatremia 06/06/2017  . Hyperkalemia 06/06/2017  . Anemia 06/06/2017  . GI bleeding 06/06/2017  . 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 Dyspnea 07/08/2016    Past Surgical History:  Procedure Laterality Date  . APPENDECTOMY    . COLONOSCOPY          Home Medications    Prior to Admission medications   Medication Sig Start Date End Date Taking? Authorizing Provider  coagulation factor IX, recomb, (BENEFIX) 1000 units injection Inject 4,000 Units into the vein See admin instructions. As directed by hematologist 07/28/16  Yes [provider]  Emollient (AQUAPHOR EX) Apply 1 application topically 2 (two) times daily as needed (for dry skin).  12/08/17  Yes [provider]  guaiFENesin (MUCINEX) 600 MG 12 hr tablet Take 600 mg by mouth 2 (two) times daily.   Yes [provider]  lactulose (CHRONULAC) 10 GM/15ML solution Take 30 mLs by mouth 2 (two) times daily. 03/16/17  Yes [provider]  levothyroxine (SYNTHROID, LEVOTHROID) 50 MCG tablet Take 50 mcg by mouth daily.   Yes [provider]  pantoprazole (PROTONIX) 40 MG tablet Take 40 mg by mouth daily. 01/03/18  Yes [provider]  pseudoephedrine (SUDAFED) 30 MG tablet Take 30 mg by mouth daily as needed for congestion.  06/09/17  Yes [provider]  spironolactone (ALDACTONE) 100 MG tablet Take 100 mg by mouth daily.  05/04/16  Yes [provider]  vitamin B-12 (CYANOCOBALAMIN) 1000 MCG tablet Take 1,000 mcg by mouth daily as needed (for supplementary).  12/08/17  Yes [provider]    Family History Family History  Problem Relation Age of Onset  . Dementia Mother   . Hypertension Father   . Heart attack Father     Social History Social History   Tobacco Use  . Smoking status: Never Smoker  . Smokeless tobacco: Never Used  Substance Use Topics  . Alcohol use: No  . Drug use: No     Allergies   Morphine and Zolpidem   Review of Systems Review of Systems  All other  systems reviewed and are negative.    Physical Exam Updated Vital Signs BP 121/71 (BP Location: Right Arm)   Pulse 85   Temp 98.3 F (36.8 C) (Oral)   Resp 16   SpO2 99%   Physical Exam  Constitutional: He is oriented to person, place, and time. He appears well-developed and well-nourished.  Non-toxic appearance. No distress.  HENT:  Head:    Nose: No septal deviation.  Eyes: Pupils are equal, round, and reactive to light. Conjunctivae, EOM and lids are normal.  Neck: Normal range of motion. Neck supple. No tracheal deviation present. No thyroid mass present.  Cardiovascular: Normal rate, regular rhythm and normal heart sounds. Exam  reveals no gallop.  No murmur heard. Pulmonary/Chest: Effort normal and breath sounds normal. No stridor. No respiratory distress. He has no decreased breath sounds. He has no wheezes. He has no rhonchi. He has no rales.  Abdominal: Soft. Normal appearance and bowel sounds are normal. He exhibits no distension. There is no tenderness. There is no rebound and no CVA tenderness.  Musculoskeletal: Normal range of motion. He exhibits no edema.       Right wrist: He exhibits tenderness and bony tenderness.       Left hip: He exhibits tenderness and bony tenderness. He exhibits normal range of motion and normal strength.       Arms:      Legs: Neurological: He is alert and oriented to person, place, and time. He has normal strength. No cranial nerve deficit or sensory deficit. GCS eye subscore is 4. GCS verbal subscore is 5. GCS motor subscore is 6.  Skin: Skin is warm and dry. No abrasion and no rash noted.  Psychiatric: He has a normal mood and affect. His speech is normal and behavior is normal.  Nursing note and vitals reviewed.    ED Treatments / Results  Labs (all labs ordered are listed, but only abnormal results are displayed) Labs Reviewed - No data to display  EKG None  Radiology Dg Wrist Complete Right  Result Date: 03/01/2018 CLINICAL DATA:  Patient reports he tripped over his dog today and fell onto concrete sidewalk. Now complaining of left leg pain, right hip pain, and right wrist pain. States most of his pain is in left leg. Reports he has history of previous fracture to right wrist and "they let it heal on its own." Deformity noted to right wrist, but patient states that is his normal due to previous healing of fracture. Patient does report most of his wrist pain is located on medial side of wrist. EXAM: RIGHT WRIST - COMPLETE 3+ VIEW COMPARISON:  05/21/2016 FINDINGS: No acute fracture. There is an old healed fracture of the distal radius, with residual deformity. There is an  ununited old fracture of the ulnar styloid. There is residual ulnar positive variance due to the prior fracture. Joints are normally spaced and aligned. Surrounding soft tissues are unremarkable. IMPRESSION: No acute fracture or dislocation. Electronically Signed   By: Lajean Manes M.D.   On: 03/01/2018 16:27   Ct Head Wo Contrast  Result Date: 03/01/2018 CLINICAL DATA:  Fall. Impact to face, nose and RIGHT supra orbital margin. EXAM: CT HEAD WITHOUT CONTRAST CT MAXILLOFACIAL WITHOUT CONTRAST CT CERVICAL SPINE WITHOUT CONTRAST TECHNIQUE: Multidetector CT imaging of the head, cervical spine, and maxillofacial structures were performed using the standard protocol without intravenous contrast. Multiplanar CT image reconstructions of the cervical spine and maxillofacial structures were also generated. COMPARISON:  Eighteen dated 06/06/2017.  FINDINGS: CT HEAD FINDINGS Brain: Mild generalized age related parenchymal atrophy with commensurate dilatation of the ventricles and sulci. Ventricles are stable in size and configuration. Chronic small vessel ischemic changes again noted within the bilateral periventricular and subcortical white matter regions. No mass, hemorrhage, edema or other evidence of acute parenchymal abnormality. No extra-axial hemorrhage. Vascular: No hyperdense vessel or unexpected calcification. Skull: Normal. Negative for fracture or focal lesion. Other: None. CT MAXILLOFACIAL FINDINGS Osseous: Lower frontal bones are intact and normally aligned bilaterally. Osseous structures about the orbits are intact and normally aligned bilaterally. Slightly displaced/depressed fractures at the anterior aspects of the nasal bones. Walls of the maxillary sinuses are intact and normally aligned bilaterally. Bilateral zygomatic arches and pterygoid plates are intact. No mandible fracture or displacement seen. Orbits: Orbital globes appear intact and normally positioned bilaterally. No retro-orbital fluid or  edema. Sinuses: Scattered mucosal thickening.  No fluid levels. Soft tissues: No circumscribed soft tissue hematoma seen. CT CERVICAL SPINE FINDINGS Alignment: No evidence of acute vertebral body subluxation. Skull base and vertebrae: No fracture line or displaced fracture fragment identified. Facet joints appear intact and normally aligned throughout. Soft tissues and spinal canal: No prevertebral fluid or swelling. No visible canal hematoma. Disc levels: Degenerative changes throughout the cervical spine, at least moderate in degree throughout, with associated disc space narrowings and osseous spurring. Associated central canal stenoses at multiple levels, moderate in degree at the C2-3 through C4-5 levels, moderate to severe in degree at the C5-6 level with probable mass effect on the cervical cord. Suspect associated nerve root impingement at multiple levels bilaterally. Upper chest: No acute findings. Other: None. IMPRESSION: 1. No acute intracranial abnormality. No intracranial hemorrhage or edema. No skull fracture. Chronic small vessel ischemic changes within the white matter. 2. Displaced/depressed fractures at the anterior aspects of the nasal bones. No other facial bone fracture or dislocation seen. 3. No fracture or acute subluxation within the cervical spine. Degenerative changes of the cervical spine, as detailed above. Electronically Signed   By: Franki Cabot M.D.   On: 03/01/2018 17:17   Ct Cervical Spine Wo Contrast  Result Date: 03/01/2018 CLINICAL DATA:  Fall. Impact to face, nose and RIGHT supra orbital margin. EXAM: CT HEAD WITHOUT CONTRAST CT MAXILLOFACIAL WITHOUT CONTRAST CT CERVICAL SPINE WITHOUT CONTRAST TECHNIQUE: Multidetector CT imaging of the head, cervical spine, and maxillofacial structures were performed using the standard protocol without intravenous contrast. Multiplanar CT image reconstructions of the cervical spine and maxillofacial structures were also generated. COMPARISON:   Eighteen dated 06/06/2017. FINDINGS: CT HEAD FINDINGS Brain: Mild generalized age related parenchymal atrophy with commensurate dilatation of the ventricles and sulci. Ventricles are stable in size and configuration. Chronic small vessel ischemic changes again noted within the bilateral periventricular and subcortical white matter regions. No mass, hemorrhage, edema or other evidence of acute parenchymal abnormality. No extra-axial hemorrhage. Vascular: No hyperdense vessel or unexpected calcification. Skull: Normal. Negative for fracture or focal lesion. Other: None. CT MAXILLOFACIAL FINDINGS Osseous: Lower frontal bones are intact and normally aligned bilaterally. Osseous structures about the orbits are intact and normally aligned bilaterally. Slightly displaced/depressed fractures at the anterior aspects of the nasal bones. Walls of the maxillary sinuses are intact and normally aligned bilaterally. Bilateral zygomatic arches and pterygoid plates are intact. No mandible fracture or displacement seen. Orbits: Orbital globes appear intact and normally positioned bilaterally. No retro-orbital fluid or edema. Sinuses: Scattered mucosal thickening.  No fluid levels. Soft tissues: No circumscribed soft tissue hematoma seen. CT CERVICAL  SPINE FINDINGS Alignment: No evidence of acute vertebral body subluxation. Skull base and vertebrae: No fracture line or displaced fracture fragment identified. Facet joints appear intact and normally aligned throughout. Soft tissues and spinal canal: No prevertebral fluid or swelling. No visible canal hematoma. Disc levels: Degenerative changes throughout the cervical spine, at least moderate in degree throughout, with associated disc space narrowings and osseous spurring. Associated central canal stenoses at multiple levels, moderate in degree at the C2-3 through C4-5 levels, moderate to severe in degree at the C5-6 level with probable mass effect on the cervical cord. Suspect  associated nerve root impingement at multiple levels bilaterally. Upper chest: No acute findings. Other: None. IMPRESSION: 1. No acute intracranial abnormality. No intracranial hemorrhage or edema. No skull fracture. Chronic small vessel ischemic changes within the white matter. 2. Displaced/depressed fractures at the anterior aspects of the nasal bones. No other facial bone fracture or dislocation seen. 3. No fracture or acute subluxation within the cervical spine. Degenerative changes of the cervical spine, as detailed above. Electronically Signed   By: Franki Cabot M.D.   On: 03/01/2018 17:17   Dg Hip Unilat With Pelvis 2-3 Views Right  Result Date: 03/01/2018 CLINICAL DATA:  Patient reports he tripped over his dog today and fell onto concrete sidewalk. Now complaining of left leg pain, right hip pain, and right wrist pain. States most of his pain is in left leg. Reports he has history of previous fracture to right wrist and "they let it heal on its own." Deformity noted to right wrist, but patient states that is his normal due to previous healing of fracture. Patient does report most of his wrist pain is located on medial side of wrist. EXAM: DG HIP (WITH OR WITHOUT PELVIS) 2-3V RIGHT COMPARISON:  None. FINDINGS: No fracture.  No bone lesion. Hip joints, SI joints and symphysis pubis are normally spaced and aligned. Soft tissues are unremarkable. IMPRESSION: Negative. Electronically Signed   By: Lajean Manes M.D.   On: 03/01/2018 16:29   Dg Femur Min 2 Views Left  Result Date: 03/01/2018 CLINICAL DATA:  Patient reports he tripped over his dog today and fell onto concrete sidewalk. Now complaining of left leg pain, right hip pain, and right wrist pain. States most of his pain is in left leg. Reports he has history of previous fracture to right wrist and "they let it heal on its own." Deformity noted to right wrist, but patient states that is his normal due to previous healing of fracture. Patient does  report most of his wrist pain is located on medial side of wrist. EXAM: LEFT FEMUR 2 VIEWS COMPARISON:  None. FINDINGS: No fracture.  No bone lesion. Hip and knee joints are normally spaced and aligned. There is minor subchondral sclerosis along the superior left acetabulum minor marginal osteophytes from the base of the left femoral head. No other degenerative change. Soft tissues are unremarkable. IMPRESSION: No fracture or dislocation Electronically Signed   By: Lajean Manes M.D.   On: 03/01/2018 16:30   Ct Maxillofacial Wo Cm  Result Date: 03/01/2018 CLINICAL DATA:  Fall. Impact to face, nose and RIGHT supra orbital margin. EXAM: CT HEAD WITHOUT CONTRAST CT MAXILLOFACIAL WITHOUT CONTRAST CT CERVICAL SPINE WITHOUT CONTRAST TECHNIQUE: Multidetector CT imaging of the head, cervical spine, and maxillofacial structures were performed using the standard protocol without intravenous contrast. Multiplanar CT image reconstructions of the cervical spine and maxillofacial structures were also generated. COMPARISON:  Eighteen dated 06/06/2017. FINDINGS: CT HEAD FINDINGS  Brain: Mild generalized age related parenchymal atrophy with commensurate dilatation of the ventricles and sulci. Ventricles are stable in size and configuration. Chronic small vessel ischemic changes again noted within the bilateral periventricular and subcortical white matter regions. No mass, hemorrhage, edema or other evidence of acute parenchymal abnormality. No extra-axial hemorrhage. Vascular: No hyperdense vessel or unexpected calcification. Skull: Normal. Negative for fracture or focal lesion. Other: None. CT MAXILLOFACIAL FINDINGS Osseous: Lower frontal bones are intact and normally aligned bilaterally. Osseous structures about the orbits are intact and normally aligned bilaterally. Slightly displaced/depressed fractures at the anterior aspects of the nasal bones. Walls of the maxillary sinuses are intact and normally aligned bilaterally.  Bilateral zygomatic arches and pterygoid plates are intact. No mandible fracture or displacement seen. Orbits: Orbital globes appear intact and normally positioned bilaterally. No retro-orbital fluid or edema. Sinuses: Scattered mucosal thickening.  No fluid levels. Soft tissues: No circumscribed soft tissue hematoma seen. CT CERVICAL SPINE FINDINGS Alignment: No evidence of acute vertebral body subluxation. Skull base and vertebrae: No fracture line or displaced fracture fragment identified. Facet joints appear intact and normally aligned throughout. Soft tissues and spinal canal: No prevertebral fluid or swelling. No visible canal hematoma. Disc levels: Degenerative changes throughout the cervical spine, at least moderate in degree throughout, with associated disc space narrowings and osseous spurring. Associated central canal stenoses at multiple levels, moderate in degree at the C2-3 through C4-5 levels, moderate to severe in degree at the C5-6 level with probable mass effect on the cervical cord. Suspect associated nerve root impingement at multiple levels bilaterally. Upper chest: No acute findings. Other: None. IMPRESSION: 1. No acute intracranial abnormality. No intracranial hemorrhage or edema. No skull fracture. Chronic small vessel ischemic changes within the white matter. 2. Displaced/depressed fractures at the anterior aspects of the nasal bones. No other facial bone fracture or dislocation seen. 3. No fracture or acute subluxation within the cervical spine. Degenerative changes of the cervical spine, as detailed above. Electronically Signed   By: Franki Cabot M.D.   On: 03/01/2018 17:17    Procedures Procedures (including critical care time)  Medications Ordered in ED Medications  Tdap (BOOSTRIX) injection 0.5 mL (0.5 mLs Intramuscular Not Given 03/01/18 2308)  tetanus & diphtheria toxoids (adult) (TENIVAC) injection 0.5 mL (0.5 mLs Intramuscular Given 03/01/18 2303)     Initial Impression /  Assessment and Plan / ED Course  I have reviewed the triage vital signs and the nursing notes.  Pertinent labs & imaging results that were available during my care of the patient were reviewed by me and considered in my medical decision making (see chart for details).       Patient ambulated here in the department but continues to complain of left hip pain despite normal hip films.  Patient offered further imaging of left hip to rule out fracture and he has declined.  He was instructed to return if pain is uncontrolled with muscle relaxants and analgesics.  Wrist x-ray without acute fracture.  CT of maxillofacial noted for nasal bone fracture.  CT cervical spine as well as CT head without acute findings.  Will give referral to facial trauma surgeon on-call along with return precautions  Final Clinical Impressions(s) / ED Diagnoses   Final diagnoses:  Fall    ED Discharge Orders    None       Lacretia Leigh, MD 03/01/18 2329    Lacretia Leigh, MD 03/01/18 2330

## 2018-03-01 NOTE — ED Triage Notes (Signed)
Patient tripped and fell on sidewalk. Multiple abrasions to right wrist, face, and knees. Patient denies LOC, is not on blood thinner but has hemophilia.

## 2018-03-01 NOTE — ED Provider Notes (Signed)
Patient placed in [facility] pathway, seen and evaluated   Chief Complaint: Fall  HPI:   Patient had a mechanical fall on sidewalk today and fell onto his face.  Patient has history of hemophilia.  Reports abrasions to his face, right wrist, bilateral knees.  He reports left hip and left leg pain.  Patient denies LOC.  Patient does have clotting factors at bedside from home if needed.  Patient has not ambulated since the fall.  Patient unsure of last tetanus shot.  ROS: Left hip pain, abrasions, right wrist pain Denies LOC, vision changes, headache, abdominal pain, chest pain  Physical Exam:   Gen: No distress  Neuro: Awake and Alert  Skin: Warm    Focused Exam: No bilateral hemotympanum.  No septal hematoma.  No skull depression.  No focal abdominal tenderness.  No ecchymosis, step-offs of the chest.  Heart regular rate and rhythm.  Lungs clear to auscultation bilaterally.  Neurovascularly intact in all extremities.  Abrasions to the face and bilateral knees.  No laceration that requires suturing at this time.  No obvious deformity noted.  Patient has limited range of motion of right wrist secondary to pain.  Also has pain with range of motion of left hip.  Skin compartments are soft.   The patient is alert, attentive, and oriented x 3. Speech is clear. Cranial nerve II-VII grossly intact. Negative pronator drift. Sensation intact. Strength 5/5 in all extremities. Reflexes 2+ and symmetric at biceps, triceps, knees, and ankles. Rapid alternating movement and fine finger movements intact.  Romberg and gait deferred    Initiation of care has begun. The patient has been counseled on the process, plan, and necessity for staying for the completion/evaluation, and the remainder of the medical screening examination   Discussed with the patient that exiting the department prior to completion of the work-up is AMA and there is no guarantee that there are no emergency medical conditions present.      Doristine Devoid, PA-C 03/01/18 1507    Carmin Muskrat, MD 03/01/18 2013

## 2018-03-01 NOTE — ED Notes (Signed)
Pt unsteady while getting up but able to ambulate with x1 assistance. States pain in R hip.

## 2018-03-02 NOTE — ED Provider Notes (Signed)
Called patient at 745am to encourage him to return to ED for factor replacement due to last pm head injury. Pt denies headache or active bleeding this am. No new joint swelling.I discussed with him the risk of life threatening head bleed but the  pt stated that he didn't want to return to the ED and would instead f/u his hematologist. I spoke with Dr. Lanell Persons, pts hematologist, this am about the patients current condition and he stated that with a negative head CT, the risk of head bleed is low and that he will expecting the patients call. Appreciate his input.   Lacretia Leigh, MD 03/02/18 [phone]

## 2018-03-03 IMAGING — DX DG WRIST COMPLETE 3+V*R*
4 series · 4 of 4 positions shown · non-contrast
Comparison: None.

CLINICAL DATA: Fall on outstretched right wrist this morning.

EXAM:
RIGHT WRIST - COMPLETE 3+ VIEW

[wrist pa]
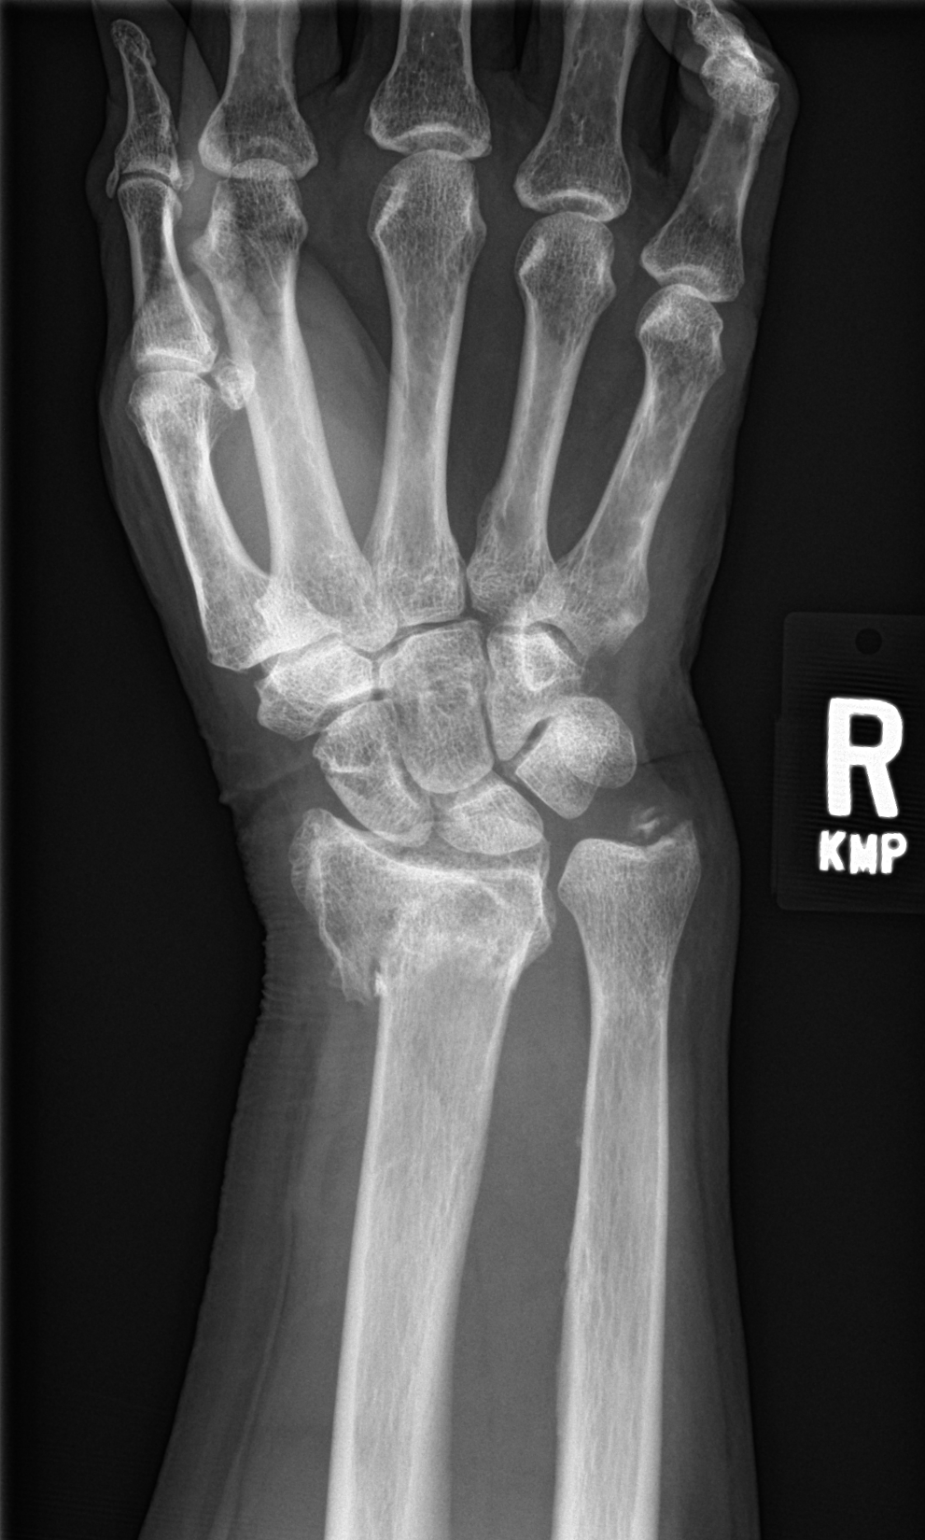

[wrist lat]
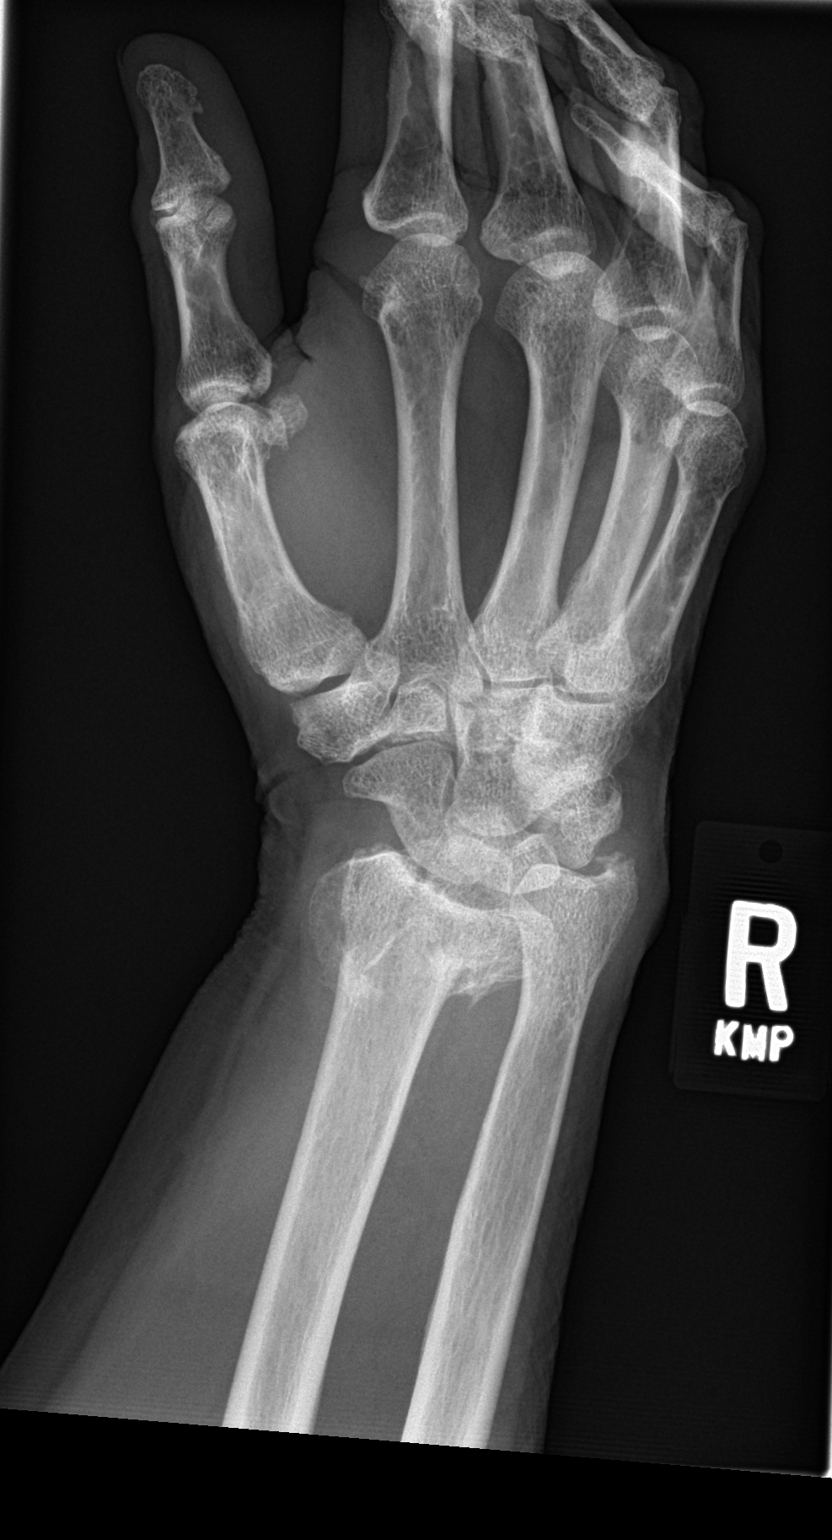

[wrist obl]
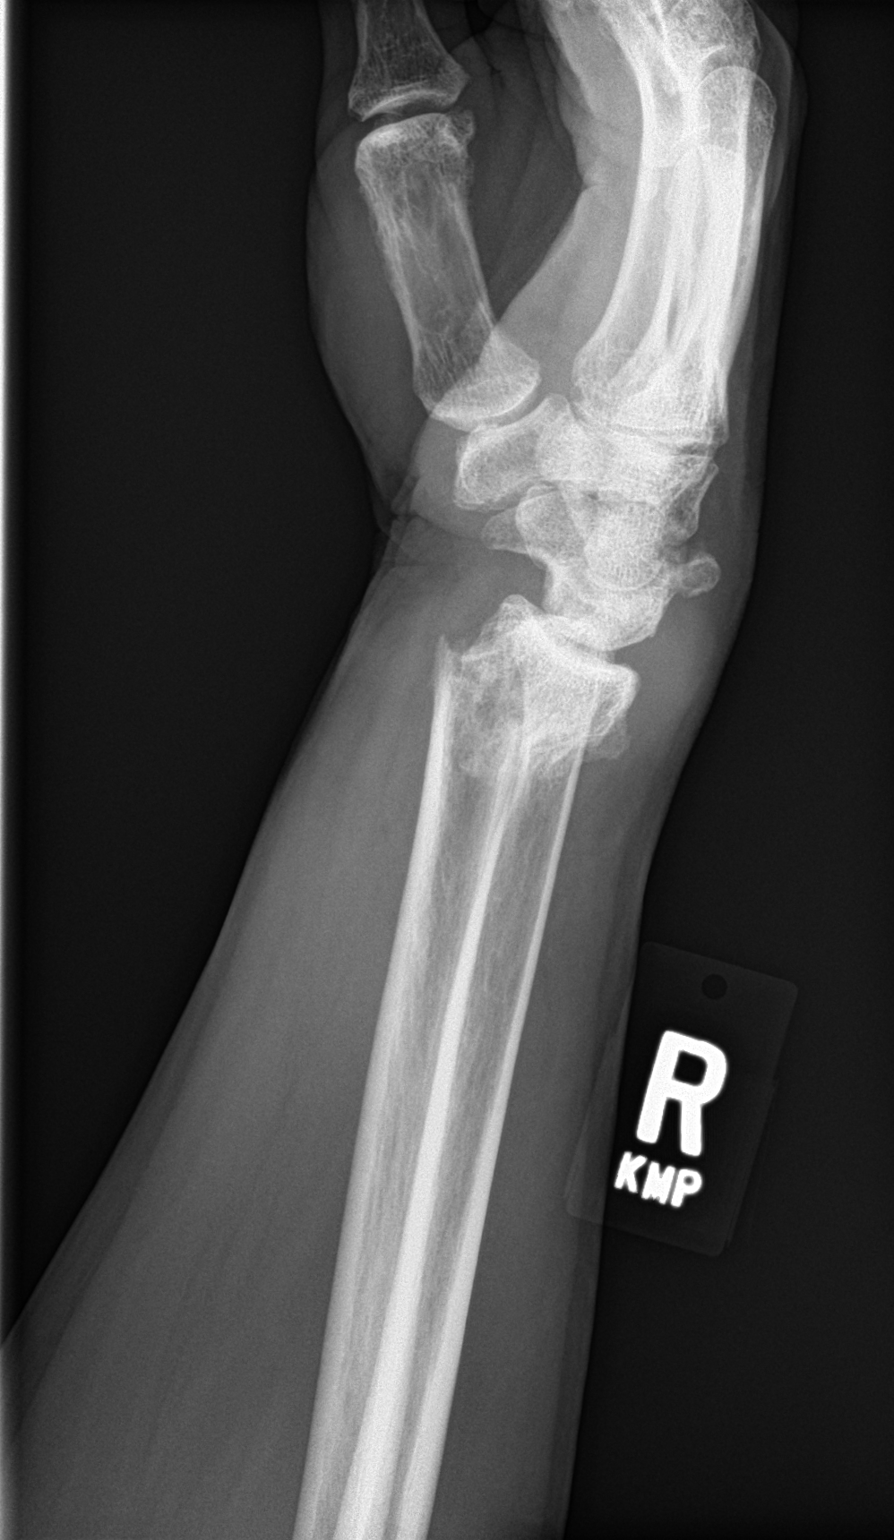

[wrist navicular]
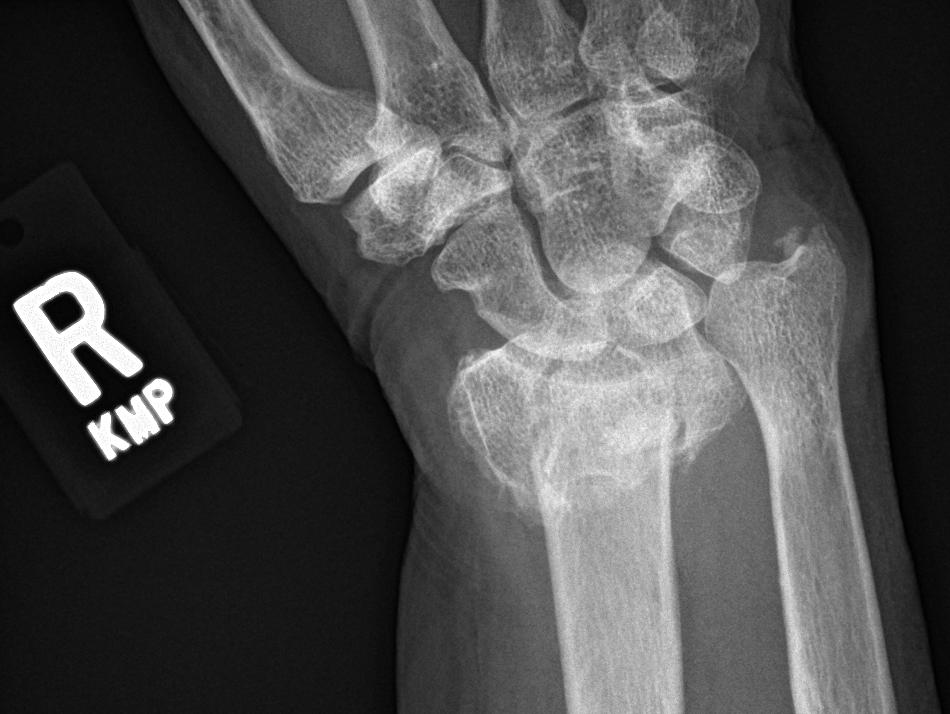

[4 of 4 positions shown; findings below may reference images not displayed]

FINDINGS: There is a comminuted distal right radial fracture with posterior
displacement [DATE] shaft width and posterior angulation. Ulnar styloid
fracture noted. No subluxation or dislocation.
IMPRESSION: Posteriorly displaced and angulated comminuted distal right radial
fracture. Ulnar styloid fracture.

## 2018-03-03 IMAGING — CT CT HEAD W/O CM
3 of 4 series · 14 of 47 positions shown, 16 images · non-contrast
Comparison: None.

CLINICAL DATA: Post fall, now with wrist pain.

EXAM:
CT HEAD WITHOUT CONTRAST
TECHNIQUE: Contiguous axial images were obtained from the base of the skull
through the vertex without intravenous contrast.

[Series 2: head w/o · axial · non-contrast · 0.45mm/px · z∈[-126,-11]mm · 8 of 29 slices shown, 10 images]
[im 3/29  brain]
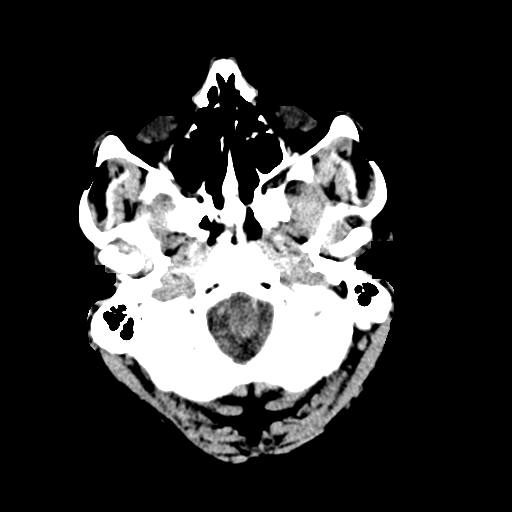
[im 3/29  bone]
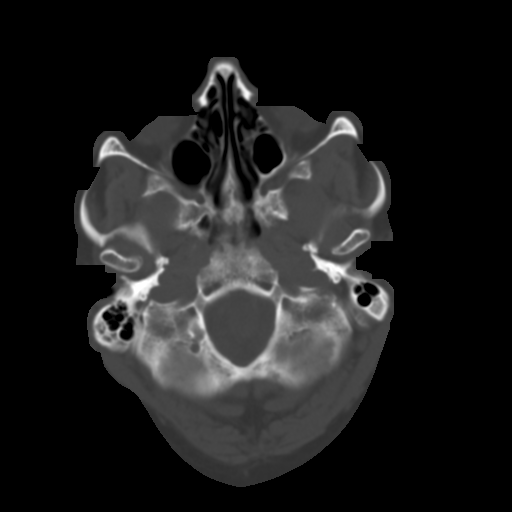
[im 6/29  brain]
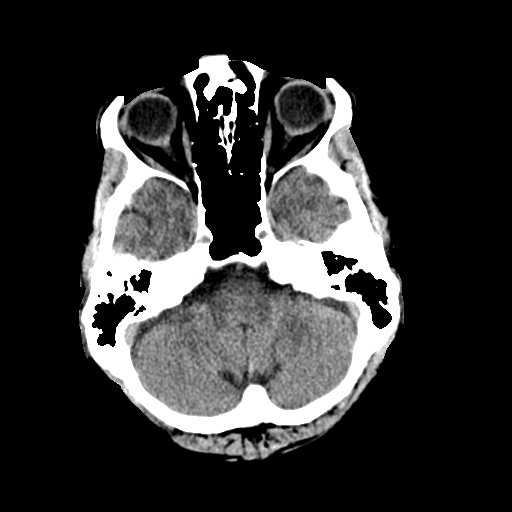
[im 9/29  brain]
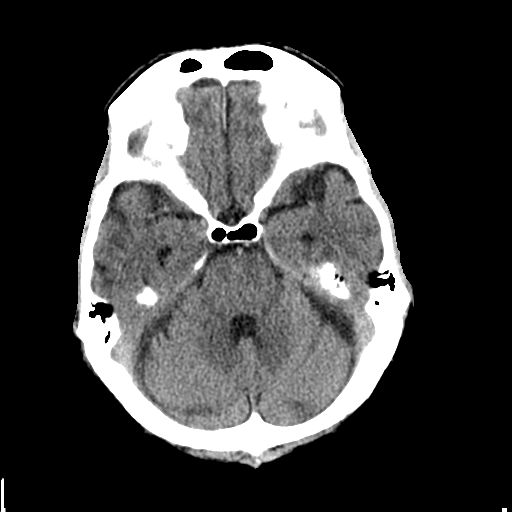
[im 12/29  brain]
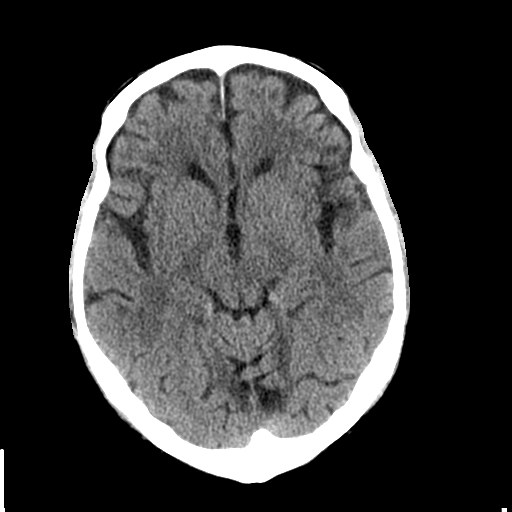
[im 17/29  brain]
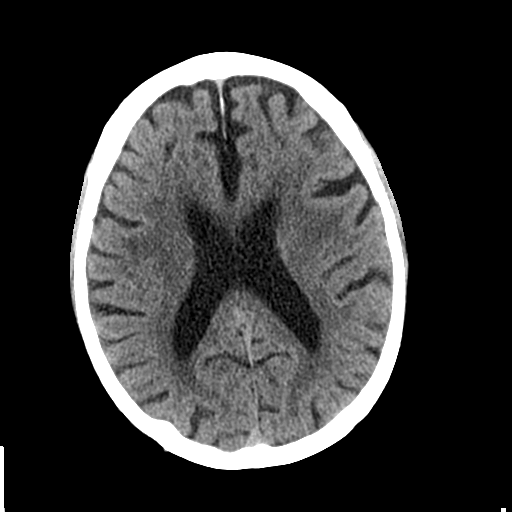
[im 17/29  bone]
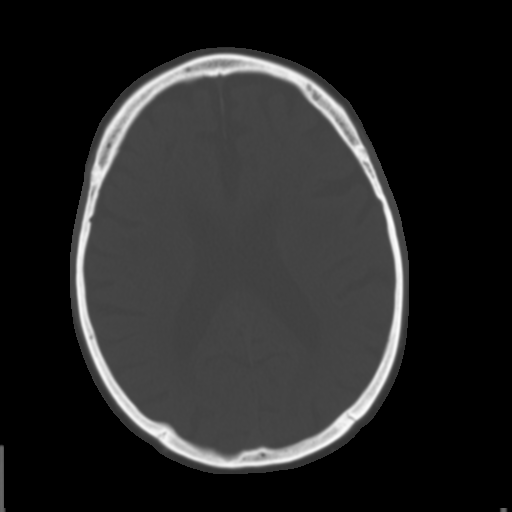
[im 20/29  brain]
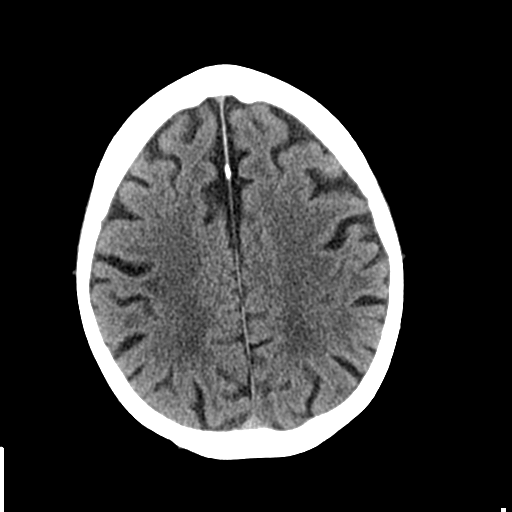
[im 23/29  brain]
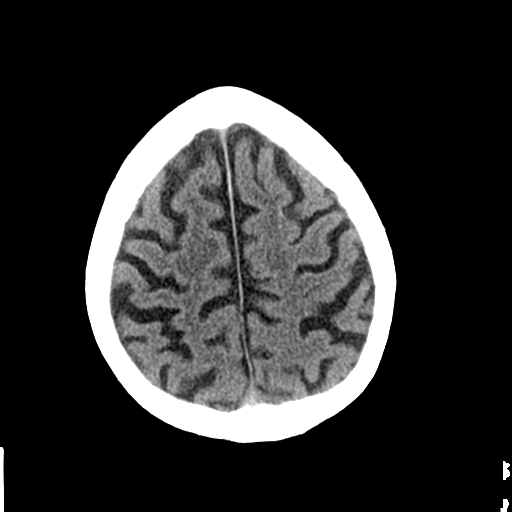
[im 26/29  brain]
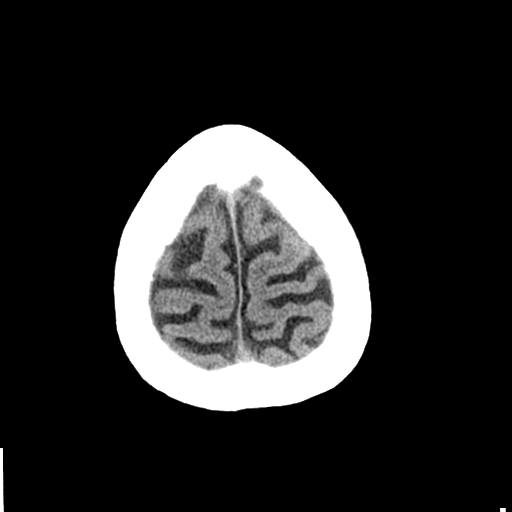

[Series 5: coronal · coronal · 0.32mm/px · 3 of 71 slices shown]
[im 24/71  brain]
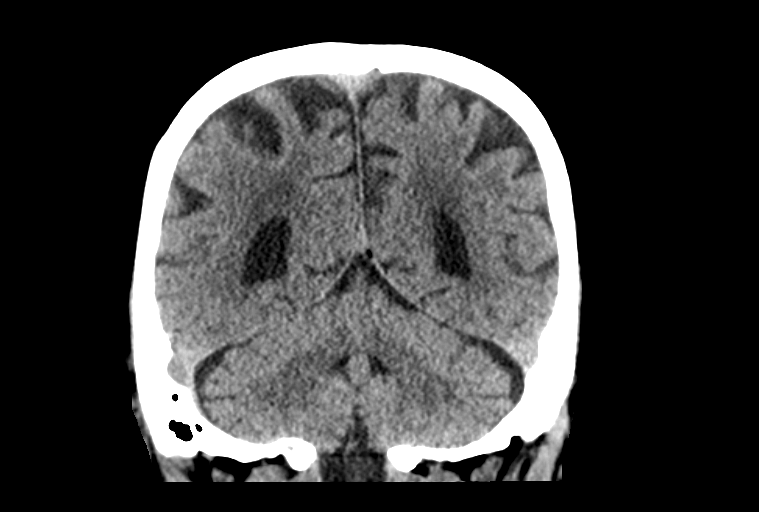
[im 32/71  brain]
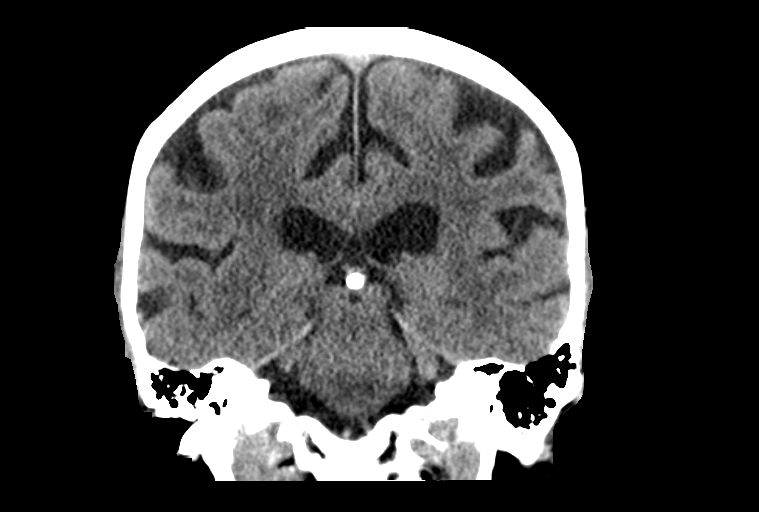
[im 39/71  brain]
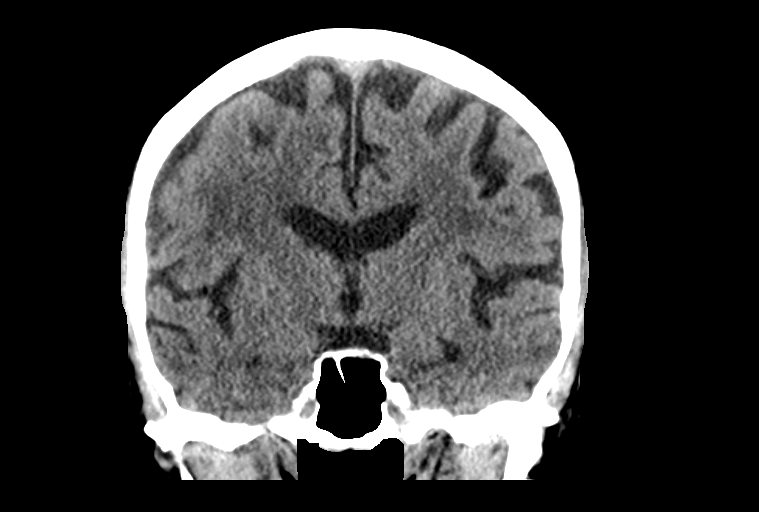

[Series 6: sagittal · sagittal · 0.31mm/px · 3 of 63 slices shown]
[im 21/63  brain]
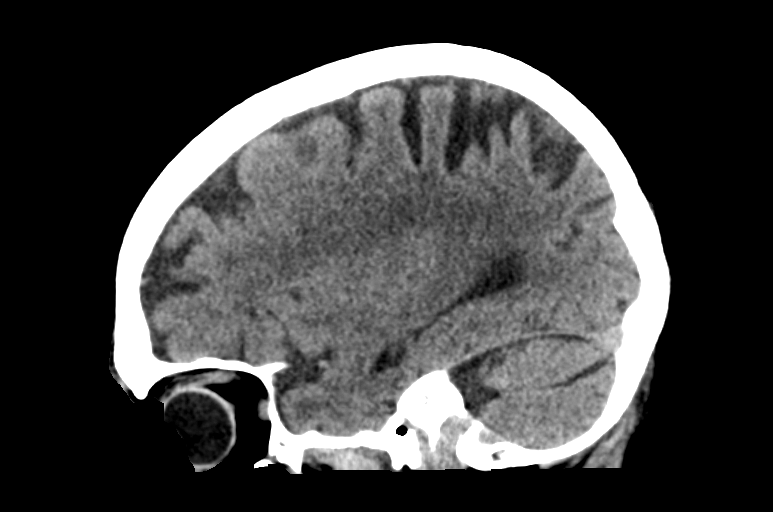
[im 32/63  brain]
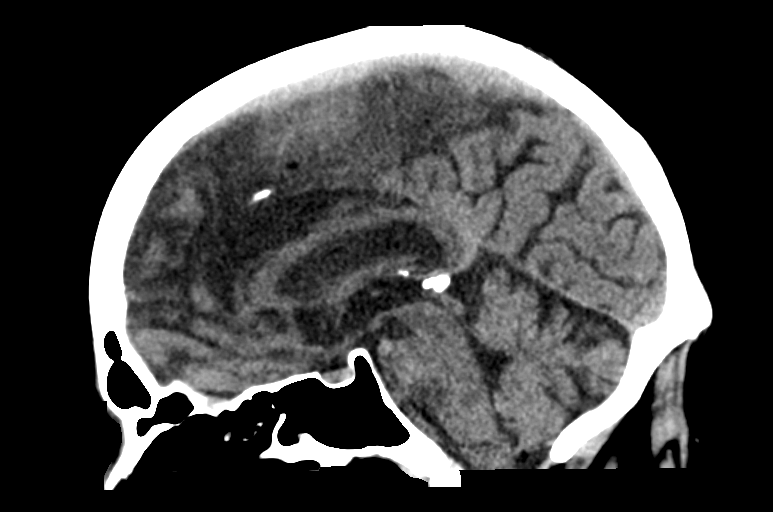
[im 42/63  brain]
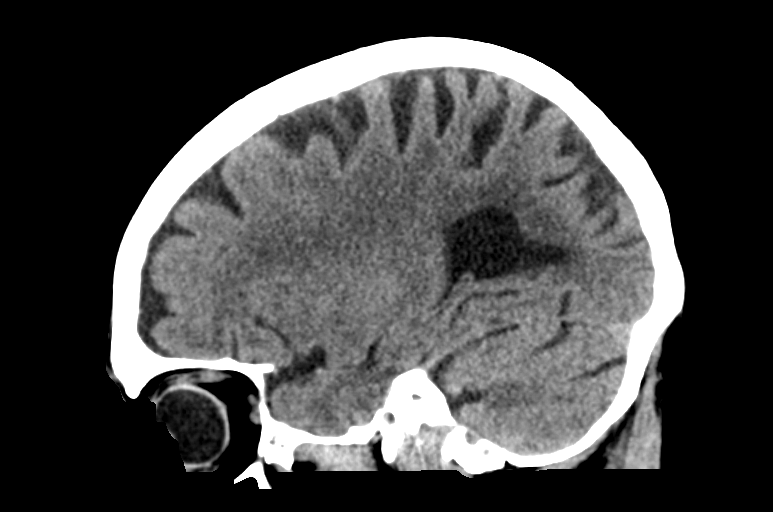

[14 of 47 positions shown; findings below may reference images not displayed]

FINDINGS: Brain: Mild atrophy with sulcal prominence centralized volume loss
with commensurate ex vacuo dilatation of the ventricular system.
Scattered periventricular hypodensities compatible with
microvascular ischemic disease. The gray-white differentiation is
otherwise well maintained without CT evidence of acute large
territory infarct. No intraparenchymal or extra-axial mass or
hemorrhage. Normal configuration of the ventricles and basilar
cisterns. No midline shift.

Vascular: No hyperdense vessel or unexpected calcification.

Skull: No displaced calvarial fracture.

Sinuses/Orbits: Limited visualization the paranasal sinuses and
mastoid air cells is normal. No air-fluid levels.

Other: Regional soft tissues appear normal. No radiopaque foreign
body.
IMPRESSION: Mild atrophy and microvascular ischemic disease without acute
intracranial process.

## 2018-05-29 DIAGNOSIS — M5030 Other cervical disc degeneration, unspecified cervical region: Secondary | ICD-10-CM | POA: Insufficient documentation

## 2018-05-29 DIAGNOSIS — M4802 Spinal stenosis, cervical region: Secondary | ICD-10-CM | POA: Insufficient documentation

## 2018-07-13 ENCOUNTER — Emergency Department (HOSPITAL_COMMUNITY): Payer: Medicare Other

## 2018-07-13 ENCOUNTER — Encounter (HOSPITAL_COMMUNITY): Payer: Self-pay | Admitting: *Deleted

## 2018-07-13 ENCOUNTER — Inpatient Hospital Stay (HOSPITAL_COMMUNITY)
Admission: EM | Admit: 2018-07-13 | Discharge: 2018-07-15 | DRG: 441 | Disposition: A | Discharge status: Home Health | Payer: Medicare Other | Attending: Internal Medicine | Admitting: Internal Medicine

## 2018-07-13 ENCOUNTER — Other Ambulatory Visit: Payer: Self-pay

## 2018-07-13 DIAGNOSIS — Z79899 Other long term (current) drug therapy: Secondary | ICD-10-CM

## 2018-07-13 DIAGNOSIS — R197 Diarrhea, unspecified: Secondary | ICD-10-CM | POA: Diagnosis present

## 2018-07-13 DIAGNOSIS — B1920 Unspecified viral hepatitis C without hepatic coma: Secondary | ICD-10-CM | POA: Diagnosis present

## 2018-07-13 DIAGNOSIS — R159 Full incontinence of feces: Secondary | ICD-10-CM | POA: Diagnosis not present

## 2018-07-13 DIAGNOSIS — K7200 Acute and subacute hepatic failure without coma: Principal | ICD-10-CM | POA: Diagnosis present

## 2018-07-13 DIAGNOSIS — E039 Hypothyroidism, unspecified: Secondary | ICD-10-CM | POA: Diagnosis present

## 2018-07-13 DIAGNOSIS — Z9119 Patient's noncompliance with other medical treatment and regimen: Secondary | ICD-10-CM | POA: Diagnosis not present

## 2018-07-13 DIAGNOSIS — Z791 Long term (current) use of non-steroidal anti-inflammatories (NSAID): Secondary | ICD-10-CM

## 2018-07-13 DIAGNOSIS — R4182 Altered mental status, unspecified: Secondary | ICD-10-CM | POA: Diagnosis present

## 2018-07-13 DIAGNOSIS — K7460 Unspecified cirrhosis of liver: Secondary | ICD-10-CM | POA: Diagnosis not present

## 2018-07-13 DIAGNOSIS — K7682 Hepatic encephalopathy: Secondary | ICD-10-CM | POA: Diagnosis present

## 2018-07-13 DIAGNOSIS — E162 Hypoglycemia, unspecified: Secondary | ICD-10-CM | POA: Diagnosis not present

## 2018-07-13 DIAGNOSIS — N183 Chronic kidney disease, stage 3 unspecified: Secondary | ICD-10-CM

## 2018-07-13 DIAGNOSIS — Z8249 Family history of ischemic heart disease and other diseases of the circulatory system: Secondary | ICD-10-CM | POA: Diagnosis not present

## 2018-07-13 DIAGNOSIS — D67 Hereditary factor IX deficiency: Secondary | ICD-10-CM | POA: Diagnosis not present

## 2018-07-13 DIAGNOSIS — K7290 Hepatic failure, unspecified without coma: Secondary | ICD-10-CM

## 2018-07-13 DIAGNOSIS — Z79890 Hormone replacement therapy (postmenopausal): Secondary | ICD-10-CM

## 2018-07-13 LAB — CBC
HCT: 34.8 % — ABNORMAL LOW (ref 39.0–52.0)
Hemoglobin: 11.8 g/dL — ABNORMAL LOW (ref 13.0–17.0)
MCH: 34.1 pg — ABNORMAL HIGH (ref 26.0–34.0)
MCHC: 33.9 g/dL (ref 30.0–36.0)
MCV: 100.6 fL — ABNORMAL HIGH (ref 78.0–100.0)
Platelets: 160 10*3/uL (ref 150–400)
RBC: 3.46 MIL/uL — ABNORMAL LOW (ref 4.22–5.81)
RDW: 13.4 % (ref 11.5–15.5)
WBC: 6.2 10*3/uL (ref 4.0–10.5)

## 2018-07-13 LAB — COMPREHENSIVE METABOLIC PANEL
ALT: 17 U/L (ref 0–44)
AST: 33 U/L (ref 15–41)
Albumin: 3.2 g/dL — ABNORMAL LOW (ref 3.5–5.0)
Alkaline Phosphatase: 58 U/L (ref 38–126)
Anion gap: 11 (ref 5–15)
BUN: 24 mg/dL — ABNORMAL HIGH (ref 8–23)
CO2: 18 mmol/L — ABNORMAL LOW (ref 22–32)
Calcium: 9.2 mg/dL (ref 8.9–10.3)
Chloride: 108 mmol/L (ref 98–111)
Creatinine, Ser: 1.31 mg/dL — ABNORMAL HIGH (ref 0.61–1.24)
GFR calc Af Amer: >60 mL/min (ref >60)
GFR calc non Af Amer: 53 mL/min — ABNORMAL LOW (ref >60)
Glucose, Bld: 87 mg/dL (ref 70–99)
Potassium: 5.1 mmol/L (ref 3.5–5.1)
Sodium: 137 mmol/L (ref 135–145)
Total Bilirubin: 2 mg/dL — ABNORMAL HIGH (ref 0.3–1.2)
Total Protein: 6.2 g/dL — ABNORMAL LOW (ref 6.5–8.1)

## 2018-07-13 LAB — I-STAT CHEM 8, ED
BUN: 36 mg/dL — ABNORMAL HIGH (ref 8–23)
Calcium, Ion: 1.09 mmol/L — ABNORMAL LOW (ref 1.15–1.40)
Chloride: 108 mmol/L (ref 98–111)
Creatinine, Ser: 1.3 mg/dL — ABNORMAL HIGH (ref 0.61–1.24)
Glucose, Bld: 82 mg/dL (ref 70–99)
HCT: 34 % — ABNORMAL LOW (ref 39.0–52.0)
Hemoglobin: 11.6 g/dL — ABNORMAL LOW (ref 13.0–17.0)
Potassium: 6.6 mmol/L (ref 3.5–5.1)
Sodium: 135 mmol/L (ref 135–145)
TCO2: 22 mmol/L (ref 22–32)

## 2018-07-13 LAB — GLUCOSE, CAPILLARY: Glucose-Capillary: 93 mg/dL (ref 70–99)

## 2018-07-13 LAB — AMMONIA: Ammonia: 116 umol/L — ABNORMAL HIGH (ref 9–35)

## 2018-07-13 LAB — CBG MONITORING, ED: Glucose-Capillary: 68 mg/dL — ABNORMAL LOW (ref 70–99)

## 2018-07-13 MED ORDER — SODIUM CHLORIDE 0.9% FLUSH
3.0000 mL | Freq: Two times a day (BID) | INTRAVENOUS | Status: DC
  Administered 2018-07-13 – 2018-07-15 (×4): 3 mL via INTRAVENOUS

## 2018-07-13 MED ORDER — LACTULOSE 10 GM/15ML PO SOLN
20.0000 g | Freq: Three times a day (TID) | ORAL | Status: DC
  Administered 2018-07-14 – 2018-07-15 (×4): 20 g via ORAL
  Filled 2018-07-13 (×4): qty 30

## 2018-07-13 MED ORDER — VITAMIN B-12 1000 MCG PO TABS
1000.0000 ug | ORAL_TABLET | Freq: Every day | ORAL | Status: DC
  Administered 2018-07-14 – 2018-07-15 (×2): 1000 ug via ORAL
  Filled 2018-07-13 (×2): qty 1

## 2018-07-13 MED ORDER — LACTULOSE 10 GM/15ML PO SOLN
20.0000 g | Freq: Once | ORAL | Status: AC
  Administered 2018-07-13: 20 g via ORAL
  Filled 2018-07-13 (×2): qty 30

## 2018-07-13 MED ORDER — PANTOPRAZOLE SODIUM 40 MG PO TBEC
40.0000 mg | DELAYED_RELEASE_TABLET | Freq: Every day | ORAL | Status: DC
  Administered 2018-07-14 – 2018-07-15 (×2): 40 mg via ORAL
  Filled 2018-07-13 (×2): qty 1

## 2018-07-13 MED ORDER — SPIRONOLACTONE 100 MG PO TABS
100.0000 mg | ORAL_TABLET | Freq: Every day | ORAL | Status: DC
  Administered 2018-07-14 – 2018-07-15 (×2): 100 mg via ORAL
  Filled 2018-07-13: qty 1
  Filled 2018-07-13 (×2): qty 2
  Filled 2018-07-13: qty 1

## 2018-07-13 MED ORDER — LEVOTHYROXINE SODIUM 50 MCG PO TABS
50.0000 ug | ORAL_TABLET | Freq: Every day | ORAL | Status: DC
  Administered 2018-07-14 – 2018-07-15 (×2): 50 ug via ORAL
  Filled 2018-07-13 (×2): qty 1

## 2018-07-13 NOTE — H&P (Addendum)
Date: 07/14/2018               Patient Name:  Justin Hampton MRN: [id number]  DOB: 01/10/1946 Age / Sex: 72 y.o., male   PCP: Benjamine Sprague, MD         Medical Service: Internal Medicine Teaching Service         Attending Physician: Dr. Bartholomew Crews, MD    First Contact: Dr. Annie Paras Pager: [phone]  Second Contact: Dr. Berline Lopes Pager: [phone]       After Hours (After 5p/  First Contact Pager: [phone]  weekends / holidays): Second Contact Pager: [phone]   Chief Complaint: Altered Mental Status   History of Present Illness:  Mr. 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 He was brought to the ED by his sister who has been staying with him and his wife for the past four weeks. She states for the past two days he has been acting strangely. He normally takes care of his own medications although the sister has been helping, and she believes he has not been taking his lactulose.  He was recently discharged on 8/26 for hepatic encephalopathy for also not taking his lactulose correctly and spent 10 days at [facility] where he did well. He has been home for 2 weeks and was doing well until 2 days ago when he stated he was not feeling well and presumably stopped taking his medications. This has been an ongoing issue as he does not like the diarrhea the lactulose causes. Per the sister, he has normal baseline and is independent. MOCA during previous admission was 14/30, but it is unclear if this was after his encephalopathy had resolved. Tonight he is oriented only to self but appears confused. When asked if he has hemophilia he states yes, when asked if he takes medications for this, he states "I take hemophilia." He denies chest pain, abdominal pain, or any other pain, shortness of breath, changes in vision, headache,  nausea, changes in bowel movement, dysuria, dizziness, or fall.  He has a history of hemophilia B for which he sees Dr. Lanell Persons at [facility]. Previous notes also mention patient's declining physical status: difficulty with stairs, managing finances, driving, and possible Parkinson's. However, no tremor noted at this time.  He is also accompanied by his wife who was diagnosed with dementia one year ago. She has become increasingly paranoid and violent recently, and possibly having hallucinations. She believes the sister is her husband's girlfriend. While in the room she insulted and cursed at both but could be distracted with other discussions. The sister states that sometimes at home she will throw things.      Meds:  Benefix - inject 4000 units if bleeding suspected aquafor Mucinex 600 mg bid Ibuprofen 400 mg q6h Lactulose 30cc bid Synthroid 50 mcg qd Robaxin 500 mg bid protonix 40 mg qd  Sudafed  Spironolactone 100 mg qd Vitamin B12 1000 mcg   Allergies: Allergies as of 07/13/2018 - Review Complete 07/13/2018  Allergen Reaction Noted  . Morphine Other (See Comments) 10/03/2011  . Zolpidem Other (See Comments) 10/03/2011   Past Medical History:  Diagnosis Date  . 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 Hepatic encephalopathy ([place]) 04/12/2012  . Hepatitis C   . Hypoglycemia   .  Hypothyroidism     Family History:  Family History  Problem Relation Age of Onset  . Dementia Mother   . Hypertension Father   . Heart attack Father     Social History: Denies smoking and alcohol - per other provider notes he drinks possibly several glasses of wine per week. Per previous physician notes family was not aware until recently that alcohol could worsen cirrhosis.  Patient lives at home with his wife who was diagnosed with dementia last year and has been increasingly violent and paranoid, possibly having hallucinations as well. His sister has been in town from  [place] for the past four weeks trying to help.  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    Review of Systems: A complete ROS was negative except as per HPI.   Physical Exam: Blood pressure 127/77, pulse 77, temperature 98.8 F (37.1 C), temperature source Oral, resp. rate 16, height 6\' 1"  (1.854 m), weight 90.7 kg, SpO2 98 %.  Constitution: NAD, lying supine in bed, pleasant HENT: Normocephalic, atraumatic Eyes: EOM intact, no scleral icterus, PERRLA Cardio: regular rate & rhythm, distant heart sounds, no m/r/r Respiratory: CTAB, no w/r/r Abdominal: +bs, soft, NTTP, non-distended MSK: +asterixis, no edema, strength symmetrical Neuro: alert but slowed affect, confused, oriented to self, cooperative Skin: no jaundice, clean, dry, intact    EKG: personally reviewed my interpretation is normal sinus rhythm with artifact.   CXR: personally reviewed my interpretation is no acute abnormalities.   CT Head Advanced atrophy. Chronic microvascular ischemic change. No acute intracranial findings. Similar appearance to priors. Electronically Signed   By: Staci Righter M.D.   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   Stage I Hepatic Encephalopathy: Patient has a history of being non-compliant with his lactulose due to side effects. During most recent hospitalization his AMS resolved with resuming lactulose. He has no signs of symptoms of infection. CT Head showed no acute findings but advanced atrophy and MRI in August did not show  signs of stroke. SBP also appears unlikely as only finding is his encephalopathy. Can consider diagnostic paracentesis, but he is at very high risk of bleeding with history of hemophilia.    - lactulose TID while in patient - at home dose is BID   - orthostatics  - repeat EKG in am  - am CBC, CMP - CBG q6h - vitamin b12, folate ordered - per previous physician note he is has not been taking his B12 - cont. PPI  - consider abdominal US although SBP remains low on differential  - strict I/O's - cont. Spironolactone 100mg  tablet   CKD: His elevated creatinine (1.31) appears to be baseline for him for the past two years, although this is unconfirmed. Prior to admission most recent Cr was 1.29.  He does not appear to be at risk for hepatorenal syndrome at this time  - avoid nephrotoxic medications  - hold fluids at this time   Hemophilia B: Hemoglobin stable at 11.6.He is supposed to take benefix at home if he ever suspects  bleed.   - SCDs - fall precautions   Social Situation: Patient's social situation is difficult as his wife's dementia has been increasingly hard for him to handle alone. He has become repeatedly altered due to not taking his lactulose and has also had admissions for falls, which is concerning considering his hemophilia. Sister is currently in town but lives in [place] and patient's wife is quite aggressive toward her. Previous physician notes also mention patient's declining physical status: difficulty with stairs, managing finances, driving, Previous physician note also mentions possibility of Parkinson's symptoms.   - PT/OT consult  Hypothyroidism: cont. 50mg  levo    IVF: none Diet: Low sodium/heart healthy VTE: SCDs Code: Full   Dispo: Admit patient to Inpatient with expected length of stay greater than 2 midnights.  SignedWelford Roche, MD 07/14/2018, 2:14 AM  Pager: [phone]

## 2018-07-13 NOTE — ED Notes (Signed)
Rn attempted to find lactulose x2 at tube station, called pharmacy

## 2018-07-13 NOTE — ED Provider Notes (Signed)
[facility] EMERGENCY DEPARTMENT Provider Note   CSN: [id number] Arrival date & time: 07/13/18  1736     History   Chief Complaint Chief Complaint  Patient presents with  . Altered Mental Status    HPI Justin Hampton is a 72 y.o. male.  The history is provided by the patient, a relative, the spouse, the EMS personnel and medical records. No language interpreter was used.  Altered Mental Status     Justin Hampton is a 72 y.o. male who presents to the Emergency Department complaining of AMS.  Level V caveat due to AMS. She is provided by the patient's sister as well as EMS. He has a history of cirrhosis and hemophilia. He was admitted to [facility] one month ago for altered mental status and had a hepatic encephalopathy at that time. He was discharged to rehab and then discharged home two weeks ago. He was doing well at home with physical therapy until two days ago when he began to have increased difficulty with gait as well as increased confusion and somnolence. No reports of head injury, fever, vomiting. He is unsure when his last bowel movement is but does not think it was today. He does not know if he is taking his lactulose or not. He denies any complaints of pain or feeling poorly. Past Medical History:  Diagnosis Date  . 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 Hepatic encephalopathy ([place]) 04/12/2012  . Hepatitis C   . Hypoglycemia   . Hypothyroidism     Patient Active Problem List   Diagnosis Date Noted  . Hepatic encephalopathy ([place]) 07/13/2018  . 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 CKD (chronic kidney disease) stage 3, GFR 30-59 ml/min (HCC) 07/13/2018  . Rectal bleeding 06/06/2017  . Acute renal failure (ARF) ([place]) 06/06/2017  . Hyponatremia 06/06/2017  . Hyperkalemia 06/06/2017  . Anemia 06/06/2017  . GI bleeding 06/06/2017  . 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[place])   . Dyspnea 07/08/2016     Past Surgical History:  Procedure Laterality Date  . APPENDECTOMY    . COLONOSCOPY          Home Medications    Prior to Admission medications   Medication Sig Start Date End Date Taking? Authorizing Provider  coagulation factor IX, recomb, (BENEFIX) 1000 units injection Inject 4,000 Units into the vein See admin instructions. As directed by hematologist 07/28/16   [provider]  Emollient (AQUAPHOR EX) Apply 1 application topically 2 (two) times daily as needed (for dry skin).  12/08/17   [provider]  guaiFENesin (MUCINEX) 600 MG 12 hr tablet Take 600 mg by mouth 2 (two) times daily.    [provider]  ibuprofen (ADVIL,MOTRIN) 400 MG tablet Take 1 tablet (400 mg total) by mouth every 6 (six) hours as needed. 03/01/18   Lacretia Leigh, MD  lactulose (CHRONULAC) 10 GM/15ML solution Take 30 mLs by mouth 2 (two) times daily. 03/16/17   [provider]  levothyroxine (SYNTHROID, LEVOTHROID) 50 MCG tablet Take 50 mcg by mouth daily.    [provider]  methocarbamol (ROBAXIN) 500 MG tablet Take 1 tablet (500 mg total) by mouth 2 (two) times daily. 03/01/18   Lacretia Leigh, MD  pantoprazole (PROTONIX) 40 MG tablet Take 40 mg by mouth daily. 01/03/18   [provider]  pseudoephedrine (SUDAFED) 30 MG tablet Take 30 mg by mouth daily as needed for congestion.  06/09/17   [provider]  spironolactone (ALDACTONE) 100 MG tablet Take 100 mg by mouth daily.  05/04/16   [provider]  vitamin B-12 (CYANOCOBALAMIN) 1000 MCG tablet Take 1,000 mcg by mouth daily as needed (for supplementary).  12/08/17   [provider]    Family History Family History  Problem Relation Age of Onset  . Dementia Mother   . Hypertension Father   . Heart attack Father     Social History Social History   Tobacco Use  . Smoking status: Never Smoker  . Smokeless tobacco: Never Used  Substance Use Topics  . Alcohol use: No  .  Drug use: No     Allergies   Morphine and Zolpidem   Review of Systems Review of Systems  All other systems reviewed and are negative.    Physical Exam Updated Vital Signs BP 127/77 (BP Location: Left Arm)   Pulse 77   Temp 98.8 F (37.1 C) (Oral)   Resp 16   Ht 6\' 1"  (1.854 m)   Wt 90.7 kg   SpO2 98%   BMI 26.39 kg/m   Physical Exam  Constitutional: He appears well-developed and well-nourished.  HENT:  Head: Normocephalic and atraumatic.  Cardiovascular: Normal rate and regular rhythm.  No murmur heard. Pulmonary/Chest: Effort normal and breath sounds normal. No respiratory distress.  Abdominal: Soft. There is no tenderness. There is no rebound and no guarding.  Musculoskeletal: He exhibits no tenderness.  Trace pitting edema to all four extremities  Neurological: He is alert.  Moderately confused. Disoriented to place, time and recent events. Five out of five strength in all four extremities.  Skin: Skin is warm and dry.  Psychiatric: He has a normal mood and affect. His behavior is normal.  Nursing note and vitals reviewed.    ED Treatments / Results  Labs (all labs ordered are listed, but only abnormal results are displayed) Labs Reviewed  COMPREHENSIVE METABOLIC PANEL - Abnormal; Notable for the following components:      Result Value   CO2 18 (*)    BUN 24 (*)    Creatinine, Ser 1.31 (*)    Total Protein 6.2 (*)    Albumin 3.2 (*)    Total Bilirubin 2.0 (*)    GFR calc non Af Amer 53 (*)    All other components within normal limits  CBC - Abnormal; Notable for the following components:   RBC 3.46 (*)    Hemoglobin 11.8 (*)    HCT 34.8 (*)    MCV 100.6 (*)    MCH 34.1 (*)    All other components within normal limits  AMMONIA - Abnormal; Notable for the following components:   Ammonia 116 (*)    All other components within normal limits  CBG MONITORING, ED - Abnormal; Notable for the following components:   Glucose-Capillary 68 (*)    All  other components within normal limits  I-STAT CHEM 8, ED - Abnormal; Notable for the following components:   Potassium 6.6 (*)    BUN 36 (*)    Creatinine, Ser 1.30 (*)    Calcium, Ion 1.09 (*)    Hemoglobin 11.6 (*)    HCT 34.0 (*)    All other components within normal limits  GLUCOSE, CAPILLARY  URINALYSIS, ROUTINE W REFLEX MICROSCOPIC  COMPREHENSIVE METABOLIC PANEL  VITAMIN M57  FOLATE RBC    EKG EKG Interpretation  Date/Time:  Friday July 13 2018 17:39:41 EDT Ventricular Rate:  82 PR  Interval:    QRS Duration: 87 QT Interval:  409 QTC Calculation: 478 R Axis:   42 Text Interpretation:  Sinus rhythm Low voltage, precordial leads Borderline prolonged QT interval Confirmed by Quintella Reichert [phone]) on 07/13/2018 5:41:53 PM   Radiology Dg Chest 2 View  Result Date: 07/13/2018 CLINICAL DATA:  Altered mental status EXAM: CHEST - 2 VIEW COMPARISON:  06/19/2016 FINDINGS: No focal opacity or pleural effusion. Cardiomediastinal silhouette within normal limits. No pneumothorax. Old left lower rib fracture IMPRESSION: No active cardiopulmonary disease. Electronically Signed   By: Donavan Foil M.D.   On: 07/13/2018 19:15   Ct Head Wo Contrast  Result Date: 07/13/2018 CLINICAL DATA:  Altered mental status. History of cirrhosis. Possible noncompliance with lactulose. EXAM: CT HEAD WITHOUT CONTRAST TECHNIQUE: Contiguous axial images were obtained from the base of the skull through the vertex without intravenous contrast. COMPARISON:  03/01/2018. FINDINGS: Brain: No evidence of acute infarction, hemorrhage, hydrocephalus, extra-axial collection or mass lesion/mass effect. Advanced atrophy, even for age. Chronic microvascular ischemic change affects the periventricular and subcortical white matter. Vascular: No hyperdense vessel or unexpected calcification. Skull: Calvarium intact. Sinuses/Orbits: Chronic sinus disease.  Negative orbits. Other: Chronic nasal bone deformity. IMPRESSION:  Advanced atrophy. Chronic microvascular ischemic change. No acute intracranial findings. Similar appearance to priors. Electronically Signed   By: Staci Righter M.D.   On: 07/13/2018 19:01    Procedures Procedures (including critical care time)  Medications Ordered in ED Medications  spironolactone (ALDACTONE) tablet 100 mg (has no administration in time range)  levothyroxine (SYNTHROID, LEVOTHROID) tablet 50 mcg (has no administration in time range)  pantoprazole (PROTONIX) EC tablet 40 mg (has no administration in time range)  vitamin B-12 (CYANOCOBALAMIN) tablet 1,000 mcg (has no administration in time range)  lactulose (CHRONULAC) 10 GM/15ML solution 20 g (has no administration in time range)  sodium chloride flush (NS) 0.9 % injection 3 mL (3 mLs Intravenous Given 07/13/18 2257)  lactulose (CHRONULAC) 10 GM/15ML solution 20 g (20 g Oral Given 07/13/18 1957)     Initial Impression / Assessment and Plan / ED Course  I have reviewed the triage vital signs and the nursing notes.  Pertinent labs & imaging results that were available during my care of the patient were reviewed by me and considered in my medical decision making (see chart for details).     She with history of hemophilia, cirrhosis here for evaluation and change in mental status over the last 24 hours. He is encephalopathy on examination with no focal neurologic deficits. Pneumonia is elevated. He did have mild hypoglycemia on ED arrival that improved after eating. Will provide lactulose for his encephalopathy. Medicine consulted for observation admission. Final Clinical Impressions(s) / ED Diagnoses   Final diagnoses:  None    ED Discharge Orders    None       Quintella Reichert, MD 07/14/18 [id number]

## 2018-07-13 NOTE — ED Triage Notes (Signed)
Per [facility], pt from home with a c/c of AMS, per family. Unknown for how long. Pt with a hx of cirrhosis and has not been taking his Lactulose. Pt CAOx1 (alert to self only). Pt denied pain. No LOC. No N/V.

## 2018-07-13 NOTE — Progress Notes (Signed)
New Admission Note:  Arrival Method: Stretcher Mental Orientation: Alert and oriented to self  Telemetry: Box 1 NSR Assessment: Completed Skin: Warm and dry.  Dry and scaly feet. Scab to Lt sheen IV: NSL Pain: Denies  Tubes: N/A Safety Measures: Safety Fall Prevention Plan initiated.  Admission: Completed 5 M  Orientation: Patient has been orientated to the room, unit and the staff. Welcome booklet given.  Family: Sister  Orders have been reviewed and implemented. Will continue to monitor the patient. Call light has been placed within reach and bed alarm has been activated.   Sima Matas BSN, RN  Phone Number: [phone]

## 2018-07-13 NOTE — Progress Notes (Signed)
Called ER RN for report. Room ready.  

## 2018-07-13 NOTE — ED Notes (Signed)
Patient transported to CT 

## 2018-07-14 DIAGNOSIS — N183 Chronic kidney disease, stage 3 (moderate): Secondary | ICD-10-CM

## 2018-07-14 DIAGNOSIS — Z9181 History of falling: Secondary | ICD-10-CM

## 2018-07-14 DIAGNOSIS — R159 Full incontinence of feces: Secondary | ICD-10-CM

## 2018-07-14 DIAGNOSIS — Z8719 Personal history of other diseases of the digestive system: Secondary | ICD-10-CM

## 2018-07-14 DIAGNOSIS — K7469 Other cirrhosis of liver: Secondary | ICD-10-CM

## 2018-07-14 DIAGNOSIS — D67 Hereditary factor IX deficiency: Secondary | ICD-10-CM

## 2018-07-14 DIAGNOSIS — Z888 Allergy status to other drugs, medicaments and biological substances status: Secondary | ICD-10-CM

## 2018-07-14 DIAGNOSIS — K7200 Acute and subacute hepatic failure without coma: Secondary | ICD-10-CM | POA: Diagnosis not present

## 2018-07-14 DIAGNOSIS — Z9689 Presence of other specified functional implants: Secondary | ICD-10-CM

## 2018-07-14 DIAGNOSIS — Z885 Allergy status to narcotic agent status: Secondary | ICD-10-CM

## 2018-07-14 DIAGNOSIS — Z9889 Other specified postprocedural states: Secondary | ICD-10-CM

## 2018-07-14 DIAGNOSIS — M2500 Hemarthrosis, unspecified joint: Secondary | ICD-10-CM

## 2018-07-14 DIAGNOSIS — Z6379 Other stressful life events affecting family and household: Secondary | ICD-10-CM

## 2018-07-14 DIAGNOSIS — K7290 Hepatic failure, unspecified without coma: Secondary | ICD-10-CM

## 2018-07-14 DIAGNOSIS — Z79899 Other long term (current) drug therapy: Secondary | ICD-10-CM

## 2018-07-14 DIAGNOSIS — Z8619 Personal history of other infectious and parasitic diseases: Secondary | ICD-10-CM

## 2018-07-14 DIAGNOSIS — Z79890 Hormone replacement therapy (postmenopausal): Secondary | ICD-10-CM

## 2018-07-14 DIAGNOSIS — E039 Hypothyroidism, unspecified: Secondary | ICD-10-CM

## 2018-07-14 LAB — COMPREHENSIVE METABOLIC PANEL
ALT: 15 U/L (ref 0–44)
AST: 25 U/L (ref 15–41)
Albumin: 3 g/dL — ABNORMAL LOW (ref 3.5–5.0)
Alkaline Phosphatase: 56 U/L (ref 38–126)
Anion gap: 10 (ref 5–15)
BUN: 22 mg/dL (ref 8–23)
CO2: 18 mmol/L — ABNORMAL LOW (ref 22–32)
Calcium: 8.9 mg/dL (ref 8.9–10.3)
Chloride: 108 mmol/L (ref 98–111)
Creatinine, Ser: 1.38 mg/dL — ABNORMAL HIGH (ref 0.61–1.24)
GFR calc Af Amer: 57 mL/min — ABNORMAL LOW (ref >60)
GFR calc non Af Amer: 50 mL/min — ABNORMAL LOW (ref >60)
Glucose, Bld: 81 mg/dL (ref 70–99)
Potassium: 4.2 mmol/L (ref 3.5–5.1)
Sodium: 136 mmol/L (ref 135–145)
Total Bilirubin: 1.9 mg/dL — ABNORMAL HIGH (ref 0.3–1.2)
Total Protein: 6 g/dL — ABNORMAL LOW (ref 6.5–8.1)

## 2018-07-14 LAB — URINALYSIS, ROUTINE W REFLEX MICROSCOPIC
Bilirubin Urine: NEGATIVE
Glucose, UA: NEGATIVE mg/dL
Hgb urine dipstick: NEGATIVE
Ketones, ur: NEGATIVE mg/dL
Leukocytes, UA: NEGATIVE
Nitrite: NEGATIVE
Protein, ur: NEGATIVE mg/dL
Specific Gravity, Urine: 1.009 (ref 1.005–1.030)
pH: 7 (ref 5.0–8.0)

## 2018-07-14 LAB — GLUCOSE, CAPILLARY
Glucose-Capillary: 77 mg/dL (ref 70–99)
Glucose-Capillary: 91 mg/dL (ref 70–99)

## 2018-07-14 LAB — VITAMIN B12: Vitamin B-12: 891 pg/mL (ref 180–914)

## 2018-07-14 NOTE — Evaluation (Signed)
Occupational Therapy Evaluation Patient Details Name: Justin Hampton MRN: [id number] DOB: [birth year] Today's Date: 07/14/2018    History of Present Illness Pt is a 72 y/o male admitted secondary to AMS and found to have hepatic encephalopathy. 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   Clinical Impression   This 72 y/o male presents with the above. At baseline pt reports independence with ADLs and mobility. Pt requiring minguard-minA for room level mobility using RW this session, intermittent VCs for safety; presenting with generalized weakness, decreased dynamic balance and decreased cognition. Pt currently requiring minguard assist for UB ADLs, minA for LB ADLs. Pt will benefit from continued acute OT services, recommend follow up [place] therapy services at time of discharge to maximize his safety and independence with ADLs and mobility after return home. Will follow.     Follow Up Recommendations  Home health OT;Supervision/Assistance - 24 hour    Equipment Recommendations  3 in 1 bedside commode(for use in shower )           Precautions / Restrictions Precautions Precautions: Fall Restrictions Weight Bearing Restrictions: No      Mobility Bed Mobility               General bed mobility comments: OOB in bathroom upon arrival  Transfers Overall transfer level: Needs assistance Equipment used: Rolling walker (2 wheeled) Transfers: Sit to/from Stand Sit to Stand: Min guard         General transfer comment: increased time, good technique, min guard for safety    Balance Overall balance assessment: Needs assistance Sitting-balance support: Feet supported Sitting balance-Leahy Scale: Good     Standing balance support: Bilateral upper extremity supported Standing balance-Leahy Scale: Poor                             ADL either performed or assessed with clinical judgement   ADL Overall  ADL's : Needs assistance/impaired Eating/Feeding: Independent;Sitting   Grooming: Set up;Sitting Grooming Details (indicate cue type and reason): pt declining to perform in standing Upper Body Bathing: Min guard;Sitting   Lower Body Bathing: Min guard;Sit to/from stand   Upper Body Dressing : Sitting;Min guard   Lower Body Dressing: Minimal assistance;Sit to/from stand   Toilet Transfer: Min guard;Minimal assistance;Ambulation;Regular Toilet;Grab bars;RW Armed forces technical officer Details (indicate cue type and reason): increased effort to rise from toilet, use of grab bars, pt noted to "flop" when sitting on toilet; educated on use of 3:1 to increase ease of transfer but pt reports having difficulties reaching to perform peri-care if using DME  Toileting- Clothing Manipulation and Hygiene: Minimal assistance;Sit to/from stand Toileting - Clothing Manipulation Details (indicate cue type and reason): assist for gown management; increased time and significant effort to perform peri-care after BM; pt initially attempting to perform in standing and unable, suggested pt return to sitting to utilize lateral leans for task completion  Tub/ Shower Transfer: Tub Buyer, retail Details (indicate cue type and reason): pt's sister reporting pt has been standing in the shower for task completion without difficulty; may benefit from 3:1 as shower chair at this time  Functional mobility during ADLs: Min guard;Minimal assistance;Rolling walker General ADL Comments: pt completing toileting and room level mobility during session; requires VCs for safe proximity to RW; pt taking phonecall with spouse (who has dementia) end of session after return to sitting in recliner, therapist remained in room in anticipation of  completing further assessment once phone call completed however pt remains on phone extended period of time. Set pt up in chair with needs in reach prior to exiting room.      Vision          Perception     Praxis      Pertinent Vitals/Pain Pain Assessment: No/denies pain     Hand Dominance     Extremity/Trunk Assessment Upper Extremity Assessment Upper Extremity Assessment: Generalized weakness   Lower Extremity Assessment Lower Extremity Assessment: Defer to PT evaluation       Communication Communication Communication: No difficulties   Cognition Arousal/Alertness: Awake/alert Behavior During Therapy: WFL for tasks assessed/performed Overall Cognitive Status: Impaired/Different from baseline Area of Impairment: Memory;Safety/judgement;Problem solving                     Memory: Decreased short-term memory   Safety/Judgement: Decreased awareness of deficits   Problem Solving: Difficulty sequencing;Requires verbal cues     General Comments       Exercises     Shoulder Instructions      Home Living Family/patient expects to be discharged to:: Private residence Living Arrangements: Spouse/significant other;Other relatives;Other (Comment)(wife has dementia; sister is staying with him currently) Available Help at Discharge: Family;Available 24 hours/day Type of Home: House Home Access: Stairs to enter [employer] of Steps: 4 Entrance Stairs-Rails: None(other options with more steps with rails available) Home Layout: Two level;Bed/bath upstairs Alternate Level Stairs-Number of Steps: 16 Alternate Level Stairs-Rails: Right Bathroom Shower/Tub: Occupational psychologist: Standard     Home Equipment: Environmental consultant - 2 wheels          Prior Functioning/Environment Level of Independence: Independent        Comments: was getting HHPT        OT Problem List: Decreased strength;Impaired balance (sitting and/or standing);Decreased cognition;Decreased activity tolerance      OT Treatment/Interventions: Self-care/ADL training;DME and/or AE instruction;Therapeutic activities;Balance training;Therapeutic  exercise;Patient/family education    OT Goals(Current goals can be found in the care plan section) Acute Rehab OT Goals Patient Stated Goal: to return to independence OT Goal Formulation: With patient Time For Goal Achievement: 07/28/18 Potential to Achieve Goals: Good  OT Frequency: Min 2X/week   Barriers to D/C:            Co-evaluation              AM-PAC PT "6 Clicks" Daily Activity     Outcome Measure Help from another person eating meals?: None Help from another person taking care of personal grooming?: A Little Help from another person toileting, which includes using toliet, bedpan, or urinal?: A Little Help from another person bathing (including washing, rinsing, drying)?: A Little Help from another person to put on and taking off regular upper body clothing?: None Help from another person to put on and taking off regular lower body clothing?: A Little 6 Click Score: 20   End of Session Equipment Utilized During Treatment: Rolling walker Nurse Communication: Mobility status  Activity Tolerance: Patient tolerated treatment well Patient left: in chair;with call bell/phone within reach;with chair alarm set  OT Visit Diagnosis: Muscle weakness (generalized) (M62.81);Other symptoms and signs involving cognitive function                Time: 0938-1829 OT Time Calculation (min): 20 min Charges:  OT General Charges $OT Visit: 1 Visit OT Evaluation $OT Eval Moderate Complexity: [place], OT [employer]  Pager [phone] Office [phone]  Raymondo Band 07/14/2018, 4:22 PM

## 2018-07-14 NOTE — Care Management Note (Signed)
Case Management Note  Patient Details  Name: Justin Hampton MRN: [id number] Date of Birth: 1946/03/02  Subjective/Objective:                    Action/Plan:  Patient active w [facility] for [facility] services. Orders placed and hospital rep notified of DC today or tomorrow. Patient states that wife who has Alzheimers was taken into custody and placed in a mental health facility and is requesting CSW resources for long term memory care. Notified Bridget CSW.  Expected Discharge Date:  07/16/18               Expected Discharge Plan:  [place]  In-House Referral:     Discharge planning Services  CM Consult  Post Acute Care Choice:  Home Health Choice offered to:  Patient  DME Arranged:    DME Agency:     HH Arranged:  RN, PT, Nurse's Aide, Social Work [employer] Agency:  [place]  Status of Service:  Completed, signed off  If discussed at [employer], dates discussed:    Additional Comments:  Carles Collet, RN 07/14/2018, 1:00 PM

## 2018-07-14 NOTE — Progress Notes (Signed)
   Subjective: The patient was sitting up in bed today preparing to eat breakfast upon entering the room. The patient feels that he is close to baseline today regarding his mental status but feels that he is still unable to completely process thoughts as he normally does. Patients sister is at bedside and also agrees with his assessment.   Objective:  Vital signs in last 24 hours: Vitals:   07/13/18 2200 07/13/18 2253 07/14/18 0527 07/14/18 0908  BP: (!) 119/99 127/77 108/79 104/76  Pulse: 81 77 69 72  Resp: 18 16 18 16   Temp:  98.8 F (37.1 C) 98.2 F (36.8 C) 98.6 F (37 C)  TempSrc:  Oral Oral Oral  SpO2: 100% 98% 99% 98%  Weight:      Height:       Physical Exam: General: Oriented to person, general location, recent events, not date or time. 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  It appears that due to complex social issues at home and watery diarrhea, the patient opted not to take his lactulose.  It appears at this time the patient had acute hepatic encephalopathy secondary to multiple missed doses of his home lactulose.  Assessment and plan: Hepatic encephalopathy: Patient appears greatly improved today but not to baseline as per sister and patient.  Given his concern with the diarrhea 2/2 to the lactulose and associated incontinence, we have decreased the dose of his lactulose to 20g 3 times daily and will observe him overnight for symptomatic  resolution.  Currently no other organic or metabolic source for his encephalopathy has been identified and given his improvement with lactulose we will continue current therapy.  The patient has notable complex social issues at home with his wife's Altheimer's type dementia.  We have placed consult case management to assist with home health RN for medication concerns, home aide for ambulation and social work. PT evaluated the paint and feel that he would benefit from PT at home. Agree to home health PT.  -lactulose 20g TID -Chest x-ray reviewed by radiologist, they do not feel this is an acute change from his prior imaging. -CMP this am stable/at baseline, with normal potassium  Diet: 2g sodium Code: Full Fluids: n/a VTE PPX: SCD's Dispo: Anticipated discharge in approximately 1 day(s).   Kathi Ludwig, MD 07/14/2018, 11:39 AM Pager: Pager# [phone]

## 2018-07-14 NOTE — Evaluation (Signed)
Physical Therapy Evaluation Patient Details Name: Rogan Wigley MRN: [id number] DOB: 1946/09/22 Today's Date: 07/14/2018   History of Present Illness  Pt is a 72 y/o male admitted secondary to AMS and found to have hepatic encephalopathy. 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  Clinical Impression  Pt presented supine in bed with HOB elevated, awake and willing to participate in therapy session. Prior to admission, pt reported that he was independent with functional mobility and ADLs. Pt lives in a two level home with a few steps to enter with his wife (who has dementia) and his sister has been staying with them as well. Pt currently at a min guard level with RW overall. Pt would benefit from stair training at next session prior to d/c home.   Pt would continue to benefit from skilled physical therapy services at this time while admitted and after d/c to address the below listed limitations in order to improve overall safety and independence with functional mobility.     Follow Up Recommendations Home health PT;Supervision/Assistance - 24 hour    Equipment Recommendations  None recommended by PT    Recommendations for Other Services       Precautions / Restrictions Precautions Precautions: Fall Restrictions Weight Bearing Restrictions: No      Mobility  Bed Mobility Overal bed mobility: Needs Assistance Bed Mobility: Supine to Sit     Supine to sit: Min guard     General bed mobility comments: increased time, HOB elevated, min guard for safety  Transfers Overall transfer level: Needs assistance Equipment used: Rolling walker (2 wheeled) Transfers: Sit to/from Stand Sit to Stand: Min guard         General transfer comment: increased time, good technique, min guard for safety  Ambulation/Gait Ambulation/Gait assistance: Min guard Gait Distance (Feet): 50 Feet(50' x2 with standing rest break) Assistive  device: Rolling walker (2 wheeled) Gait Pattern/deviations: Step-through pattern;Decreased stride length;Drifts right/left Gait velocity: decreased Gait velocity interpretation: <1.8 ft/sec, indicate of risk for recurrent falls General Gait Details: pt with mild instability but no overt LOB or need for physical assistance, min guard for safety, one standing rest break needed  Stairs            Wheelchair Mobility    Modified Rankin (Stroke Patients Only)       Balance Overall balance assessment: Needs assistance Sitting-balance support: Feet supported Sitting balance-Leahy Scale: Good     Standing balance support: Bilateral upper extremity supported Standing balance-Leahy Scale: Poor                               Pertinent Vitals/Pain Pain Assessment: No/denies pain    Home Living Family/patient expects to be discharged to:: Private residence Living Arrangements: Spouse/significant other;Other relatives;Other (Comment)(wife has dementia; sister is staying with him) Available Help at Discharge: Family;Available 24 hours/day Type of Home: House Home Access: Stairs to enter Entrance Stairs-Rails: None(other options with more steps with rails available) Entrance Stairs-Number of Steps: 4 Home Layout: Two level;Bed/bath upstairs Home Equipment: Walker - 2 wheels      Prior Function Level of Independence: Independent         Comments: was getting HHPT     Hand Dominance        Extremity/Trunk Assessment   Upper Extremity Assessment Upper Extremity Assessment: Generalized weakness    Lower Extremity Assessment Lower Extremity Assessment: Generalized weakness  Communication   Communication: No difficulties  Cognition Arousal/Alertness: Awake/alert Behavior During Therapy: WFL for tasks assessed/performed Overall Cognitive Status: Impaired/Different from baseline Area of Impairment: Memory;Safety/judgement;Problem solving                      Memory: Decreased short-term memory   Safety/Judgement: Decreased awareness of deficits   Problem Solving: Difficulty sequencing;Requires verbal cues        General Comments      Exercises     Assessment/Plan    PT Assessment Patient needs continued PT services  PT Problem List Decreased strength;Decreased range of motion;Decreased activity tolerance;Decreased balance;Decreased mobility;Decreased coordination;Decreased safety awareness;Decreased knowledge of use of DME;Decreased cognition;Decreased knowledge of precautions       PT Treatment Interventions DME instruction;Gait training;Stair training;Functional mobility training;Therapeutic activities;Therapeutic exercise;Balance training;Neuromuscular re-education;Cognitive remediation;Patient/family education    PT Goals (Current goals can be found in the Care Plan section)  Acute Rehab PT Goals Patient Stated Goal: to return to independence PT Goal Formulation: With patient Time For Goal Achievement: 07/28/18 Potential to Achieve Goals: Good    Frequency Min 3X/week   Barriers to discharge        Co-evaluation               AM-PAC PT "6 Clicks" Daily Activity  Outcome Measure Difficulty turning over in bed (including adjusting bedclothes, sheets and blankets)?: Unable Difficulty moving from lying on back to sitting on the side of the bed? : Unable Difficulty sitting down on and standing up from a chair with arms (e.g., wheelchair, bedside commode, etc,.)?: Unable Help needed moving to and from a bed to chair (including a wheelchair)?: None Help needed walking in hospital room?: A Little Help needed climbing 3-5 steps with a railing? : A Lot 6 Click Score: 12    End of Session Equipment Utilized During Treatment: Gait belt Activity Tolerance: Patient tolerated treatment well Patient left: in chair;with call bell/phone within reach;with family/visitor present Nurse Communication: Mobility  status PT Visit Diagnosis: Other abnormalities of gait and mobility (R26.89)    Time: 1914-7829 PT Time Calculation (min) (ACUTE ONLY): 17 min   Charges:   PT Evaluation $PT Eval Moderate Complexity: [place], PT, DPT  Acute Rehabilitation Services Pager [phone] Office [place] 07/14/2018, 11:30 AM

## 2018-07-15 DIAGNOSIS — K7200 Acute and subacute hepatic failure without coma: Secondary | ICD-10-CM | POA: Diagnosis not present

## 2018-07-15 LAB — GLUCOSE, CAPILLARY
Glucose-Capillary: 72 mg/dL (ref 70–99)
Glucose-Capillary: 86 mg/dL (ref 70–99)

## 2018-07-15 MED ORDER — LACTULOSE 10 GM/15ML PO SOLN: 20.0000 g | Freq: Three times a day (TID) | ORAL | 1 refills | Status: DC

## 2018-07-15 NOTE — Discharge Summary (Addendum)
Name: Justin Hampton MRN: [id number] DOB: 25-Feb-1946 72 y.o. PCP: Benjamine Sprague, MD  Date of Admission: 07/13/2018  5:36 PM Date of Discharge: 07/15/2018 Attending Physician: Larey Dresser, MD  Discharge Diagnosis: 1. Hepatic encephalopathy   Discharge Medications: Allergies as of 07/15/2018      Reactions   Morphine Other (See Comments)   Caused confusion/During appendectomy   Zolpidem Other (See Comments)   Confusion      Medication List    STOP taking these medications   ibuprofen 400 MG tablet Commonly known as:  ADVIL,MOTRIN   SUDAFED 30 MG tablet Generic drug:  pseudoephedrine     TAKE these medications   AQUAPHOR EX Apply 1 application topically 2 (two) times daily as needed (for dry skin).   BENEFIX 1000 units injection Generic drug:  coagulation factor IX (recomb) Inject 4,000 Units into the vein See admin instructions. As directed by hematologist   guaiFENesin 600 MG 12 hr tablet Commonly known as:  MUCINEX Take 600 mg by mouth 2 (two) times daily.   lactulose 10 GM/15ML solution Commonly known as:  CHRONULAC Take 30 mLs (20 g total) by mouth 3 (three) times daily. What changed:  when to take this   levothyroxine 50 MCG tablet Commonly known as:  SYNTHROID, LEVOTHROID Take 50 mcg by mouth daily.   methocarbamol 500 MG tablet Commonly known as:  ROBAXIN Take 1 tablet (500 mg total) by mouth 2 (two) times daily.   pantoprazole 40 MG tablet Commonly known as:  PROTONIX Take 40 mg by mouth daily.   spironolactone 100 MG tablet Commonly known as:  ALDACTONE Take 100 mg by mouth daily.   vitamin B-12 1000 MCG tablet Commonly known as:  CYANOCOBALAMIN Take 1,000 mcg by mouth daily as needed (for supplementary).      Disposition and follow-up:   Justin Hampton was discharged from [facility] in Stable condition.  At the hospital follow up visit please address:  1.  Hepatic Encephalopathy: Patient presented with  acute encephalopathy which responded well to resuming his home lactulose.   2.  Labs / imaging needed at time of follow-up: n/a  3.  Pending labs/ test needing follow-up: n/a  Follow-up Appointments: [place] Follow up.   Why:  For home health services  Contact information: [place] [address] [phone]           Hospital Course by problem list: 1. 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 A CT of the head was unremarkable for an acute process.It appears that due to complex social issues at home and watery diarrhea, the patient opted not to take his lactulose. His symptoms had nearly resolved by discharge following resumption of his home lactulose treatment. The patient is to continue his lactulose 20g TID and follow-up with his PCP following discharge. I have spoken to the patient and his sister who are in agreement with the plan for discharge home.   Discharge Vitals:   BP 109/70 (BP Location: Left Arm)   Pulse 64   Temp 97.9 F (36.6 C) (Oral)   Resp 20   Ht 6\' 1"  (1.854 m)   Wt 90.7 kg   SpO2 100%   BMI  26.39 kg/m   Pertinent Labs, Studies, and Procedures:  CBC Latest Ref Rng & Units 07/13/2018 07/13/2018 07/13/2018  WBC 4.0 - 10.5 K/uL - - -  Hemoglobin 13.0 - 17.0 g/dL - 11.6(L) 11.6(L)  Hematocrit 37.5 - 51.0 % 31.7(L) 34.0(L) 34.0(L)  Platelets 150 - 400 K/uL - - -   CMP Latest Ref Rng & Units 07/14/2018 07/13/2018 07/13/2018  Glucose 70 - 99 mg/dL 81 82 82  BUN 8 - 23 mg/dL 22 36(H) 36(H)  Creatinine 0.61 - 1.24 mg/dL 1.38(H) 1.30(H) 1.30(H)  Sodium 135 - 145 mmol/L 136 135 135  Potassium 3.5 - 5.1 mmol/L 4.2 6.6(HH) 6.6(HH)    Chloride 98 - 111 mmol/L 108 108 108  CO2 22 - 32 mmol/L 18(L) - -  Calcium 8.9 - 10.3 mg/dL 8.9 - -  Total Protein 6.5 - 8.1 g/dL 6.0(L) - -  Total Bilirubin 0.3 - 1.2 mg/dL 1.9(H) - -  Alkaline Phos 38 - 126 U/L 56 - -  AST 15 - 41 U/L 25 - -  ALT 0 - 44 U/L 15 - -   CT HEAD: IMPRESSION: Advanced atrophy. Chronic microvascular ischemic change. No acute intracranial findings. Similar appearance to priors.  Discharge Instructions: Discharge Instructions    Call MD for:  extreme fatigue   Complete by:  As directed    Call MD for:  persistant dizziness or light-headedness   Complete by:  As directed    Call MD for:  persistant nausea and vomiting   Complete by:  As directed    Diet - low sodium heart healthy   Complete by:  As directed    Discharge instructions   Complete by:  As directed    After further review of the chart, I feel its best to remain on the Lactulose at 20g three times daily as opposed to an increase to 30g followed by two doses of 20g daily. The lower dose at the higher frequency should assist in decreasing the rate of loose stool to some extent while providing the same benefit. Please discuss these options with your doctor as soon as possible.  I strongly recommend a close follow-up appointment with your primary care doctor following this discharge. In fact an appointment later this week or early next week would be ideal if at all possible.   If there are any concerns please feel free to contact the nursing staff who will be able to relay a message to the physician team.   Thank you for your visit to the [facility]   Increase activity slowly   Complete by:  As directed       Signed: Kathi Ludwig, MD 07/19/2018, 11:15 AM   Pager: Pager# [phone]

## 2018-07-15 NOTE — Progress Notes (Signed)
Notified [facility] that patient will DC today. Spoke w family in room, declined 3/1, no other CM needs identified.

## 2018-07-15 NOTE — Progress Notes (Signed)
   Subjective: The patient was resting in his bed today upon entering the room. He denied acute concerns stating that he felt back to baseline. The patients sister agreed that the patient was at or at least very near his baseline and as such we were all in agreement with discharge home.  Objective:  Vital signs in last 24 hours: Vitals:   07/14/18 0908 07/14/18 1630 07/14/18 2101 07/15/18 0458  BP: 104/76 106/77 121/77 107/73  Pulse: 72 75  77  Resp: 16 18 20 20   Temp: 98.6 F (37 C) 98.5 F (36.9 C) 98 F (36.7 C) 97.8 F (36.6 C)  TempSrc: Oral Oral Oral Oral  SpO2: 98% 100% 100% 98%  Weight:      Height:       Physical exam: General: A/O x4, in no acute distress, afebrile, nondiaphoretic Neuro: Able to recall recent events, location precisely, self, but did not recall the date although he knew the year, months and that it was Sunday Cardio: RRR, no mrg's Pulm: CTA bilaterally   Assessment/Plan:  Principal Problem:   Hepatic encephalopathy (HCC) Active Problems:   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  It appears that due to complex social issues at home and watery diarrhea, the patient opted not to take his lactulose.  It appears at this time the patient had acute hepatic encephalopathy secondary to multiple missed doses of his home lactulose.  Assessment and plan: Hepatic encephalopathy: Resolved. Patient at baseline and medically stable for discharge home today. Will continue home PT/OT as previously ordered. RN and aid requested. Patients wife is more stable at this time and a plan is in place to assist with her care  moving forward. The patient is to continue his lactulose 20g TID and follow-up with his PCP following discharge. I have spoken the the patient and his sister who are in agreement with the plan for discharge home today.   Dispo: Anticipated discharge in approximately 0 day(s).   Kathi Ludwig, MD 07/15/2018, 7:20 AM Pager: Pager# [phone]

## 2018-07-15 NOTE — Progress Notes (Signed)
Occupational Therapy Treatment Patient Details Name: Justin Hampton MRN: [id number] DOB: [birth year] Today's Date: 07/15/2018    History of present illness Pt is a 72 y/o male admitted secondary to AMS and found to have hepatic encephalopathy. 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   OT comments  Pt progressing towards OT goals, presents sitting up in recliner pleasant and agreeable to therapy session. Pt performing room level functional mobility using RW overall with minguard assist, though with one instance of slight LOB requiring minA to steady. Pt completed standing grooming ADLs this session with minguard, tolerating standing at sink approx 7-10 min during task performance. Pt continues to demonstrate some cognitive deficits noted during functional tasks including slower processing and decreased STM. Feel POC remains appropriate at this time. Will continue to follow acutely to progress pt towards established OT goals.   Follow Up Recommendations  Home health OT;Supervision/Assistance - 24 hour    Equipment Recommendations  3 in 1 bedside commode(for use in shower )          Precautions / Restrictions Precautions Precautions: Fall Restrictions Weight Bearing Restrictions: No       Mobility Bed Mobility               General bed mobility comments: OOB in recliner upon entry   Transfers Overall transfer level: Needs assistance Equipment used: Rolling walker (2 wheeled) Transfers: Sit to/from Stand Sit to Stand: Min guard         General transfer comment: increased time; minguard for safety; VCs for hand placement when returning to sitting    Balance Overall balance assessment: Needs assistance Sitting-balance support: Feet supported Sitting balance-Leahy Scale: Good     Standing balance support: Bilateral upper extremity supported;During functional activity Standing balance-Leahy Scale:  Fair Standing balance comment: able to maintain static standing with minguard assist; reliant on UE support during dynamic mobility                            ADL either performed or assessed with clinical judgement   ADL Overall ADL's : Needs assistance/impaired     Grooming: Min guard;Supervision/safety;Standing;Oral care;Wash/dry face                               Functional mobility during ADLs: Min guard;Minimal assistance;Rolling walker General ADL Comments: pt stood at sink approx 7-10 min for completion of grooming ADLs; required minguard for mobility to sink, intermittent minA for mobility when returning to recliner as pt with slight LOB. Pt continues to demonstrate some slower process and decreased STM, though pt reports improvements in cognition compared to time of admission                        Cognition Arousal/Alertness: Awake/alert Behavior During Therapy: [facility] for tasks assessed/performed Overall Cognitive Status: Impaired/Different from baseline Area of Impairment: Memory;Problem solving;Safety/judgement                     Memory: Decreased short-term memory   Safety/Judgement: Decreased awareness of deficits   Problem Solving: Difficulty sequencing;Requires verbal cues;Slow processing General Comments: pt requiring increased time for processing instructions and for problem solving during functional tasks; noted decreased STM as pt noted to repeat information when informing therapist of reason for admission; pt also attempting to stand at start  of session prior to having RW placed in front of him requiring verbal safety cues                           Pertinent Vitals/ Pain       Pain Assessment: No/denies pain  Home Living                                          Prior Functioning/Environment              Frequency  Min 2X/week        Progress Toward Goals  OT Goals(current goals  can now be found in the care plan section)  Progress towards OT goals: Progressing toward goals  Acute Rehab OT Goals Patient Stated Goal: hopeful for home today  OT Goal Formulation: With patient Time For Goal Achievement: 07/28/18 Potential to Achieve Goals: Good  Plan Discharge plan remains appropriate    Co-evaluation                 AM-PAC PT "6 Clicks" Daily Activity     Outcome Measure   Help from another person eating meals?: None Help from another person taking care of personal grooming?: A Little Help from another person toileting, which includes using toliet, bedpan, or urinal?: A Little Help from another person bathing (including washing, rinsing, drying)?: A Little Help from another person to put on and taking off regular upper body clothing?: None Help from another person to put on and taking off regular lower body clothing?: A Little 6 Click Score: 20    End of Session Equipment Utilized During Treatment: Rolling walker;Gait belt  OT Visit Diagnosis: Muscle weakness (generalized) (M62.81);Other symptoms and signs involving cognitive function   Activity Tolerance Patient tolerated treatment well   Patient Left in chair;with call bell/phone within reach;with chair alarm set;with family/visitor present   Nurse Communication Mobility status        Time: 1001-1020 OT Time Calculation (min): 19 min  Charges: OT General Charges $OT Visit: 1 Visit OT Treatments $Self Care/Home Management : 8-22 mins  Lou Cal, OT Supplemental Rehabilitation Services Pager [phone] Office [phone]    Justin Hampton 07/15/2018, 11:20 AM

## 2018-07-16 LAB — POCT I-STAT, CHEM 8
BUN: 36 mg/dL — ABNORMAL HIGH (ref 8–23)
Calcium, Ion: 1.09 mmol/L — ABNORMAL LOW (ref 1.15–1.40)
Chloride: 108 mmol/L (ref 98–111)
Creatinine, Ser: 1.3 mg/dL — ABNORMAL HIGH (ref 0.61–1.24)
Glucose, Bld: 82 mg/dL (ref 70–99)
HCT: 34 % — ABNORMAL LOW (ref 39.0–52.0)
Hemoglobin: 11.6 g/dL — ABNORMAL LOW (ref 13.0–17.0)
Potassium: 6.6 mmol/L (ref 3.5–5.1)
Sodium: 135 mmol/L (ref 135–145)
TCO2: 22 mmol/L (ref 22–32)

## 2018-07-16 LAB — FOLATE RBC
Folate, Hemolysate: 414.4 ng/mL
Folate, RBC: 1307 ng/mL (ref >498)
Hematocrit: 31.7 % — ABNORMAL LOW (ref 37.5–51.0)

## 2018-08-02 DIAGNOSIS — R413 Other amnesia: Secondary | ICD-10-CM | POA: Insufficient documentation

## 2018-08-02 DIAGNOSIS — R269 Unspecified abnormalities of gait and mobility: Secondary | ICD-10-CM | POA: Insufficient documentation

## 2018-08-17 ENCOUNTER — Observation Stay (HOSPITAL_COMMUNITY)
Admission: EM | Admit: 2018-08-17 | Discharge: 2018-08-20 | Disposition: A | Discharge status: Home / Self Care | Payer: Medicare Other | Attending: Family Medicine | Admitting: Family Medicine

## 2018-08-17 ENCOUNTER — Encounter (HOSPITAL_COMMUNITY): Payer: Self-pay | Admitting: Emergency Medicine

## 2018-08-17 DIAGNOSIS — Z79899 Other long term (current) drug therapy: Secondary | ICD-10-CM | POA: Insufficient documentation

## 2018-08-17 DIAGNOSIS — R4182 Altered mental status, unspecified: Secondary | ICD-10-CM | POA: Diagnosis present

## 2018-08-17 DIAGNOSIS — Z79890 Hormone replacement therapy (postmenopausal): Secondary | ICD-10-CM | POA: Diagnosis not present

## 2018-08-17 DIAGNOSIS — D631 Anemia in chronic kidney disease: Secondary | ICD-10-CM | POA: Insufficient documentation

## 2018-08-17 DIAGNOSIS — K7469 Other cirrhosis of liver: Secondary | ICD-10-CM

## 2018-08-17 DIAGNOSIS — D67 Hereditary factor IX deficiency: Secondary | ICD-10-CM | POA: Diagnosis not present

## 2018-08-17 DIAGNOSIS — K7460 Unspecified cirrhosis of liver: Secondary | ICD-10-CM | POA: Insufficient documentation

## 2018-08-17 DIAGNOSIS — Z8619 Personal history of other infectious and parasitic diseases: Secondary | ICD-10-CM

## 2018-08-17 DIAGNOSIS — E538 Deficiency of other specified B group vitamins: Secondary | ICD-10-CM | POA: Diagnosis not present

## 2018-08-17 DIAGNOSIS — B1920 Unspecified viral hepatitis C without hepatic coma: Secondary | ICD-10-CM | POA: Diagnosis not present

## 2018-08-17 DIAGNOSIS — N183 Chronic kidney disease, stage 3 unspecified: Secondary | ICD-10-CM

## 2018-08-17 DIAGNOSIS — K219 Gastro-esophageal reflux disease without esophagitis: Secondary | ICD-10-CM | POA: Diagnosis not present

## 2018-08-17 DIAGNOSIS — I671 Cerebral aneurysm, nonruptured: Secondary | ICD-10-CM | POA: Diagnosis not present

## 2018-08-17 DIAGNOSIS — R262 Difficulty in walking, not elsewhere classified: Secondary | ICD-10-CM | POA: Insufficient documentation

## 2018-08-17 DIAGNOSIS — K7290 Hepatic failure, unspecified without coma: Secondary | ICD-10-CM | POA: Diagnosis not present

## 2018-08-17 DIAGNOSIS — F0390 Unspecified dementia without behavioral disturbance: Secondary | ICD-10-CM | POA: Diagnosis not present

## 2018-08-17 DIAGNOSIS — Z9114 Patient's other noncompliance with medication regimen: Secondary | ICD-10-CM | POA: Diagnosis not present

## 2018-08-17 DIAGNOSIS — F0150 Vascular dementia without behavioral disturbance: Secondary | ICD-10-CM | POA: Diagnosis not present

## 2018-08-17 DIAGNOSIS — E039 Hypothyroidism, unspecified: Secondary | ICD-10-CM | POA: Insufficient documentation

## 2018-08-17 DIAGNOSIS — K7682 Hepatic encephalopathy: Secondary | ICD-10-CM

## 2018-08-17 LAB — CBC
HCT: 36.1 % — ABNORMAL LOW (ref 39.0–52.0)
Hemoglobin: 11.2 g/dL — ABNORMAL LOW (ref 13.0–17.0)
MCH: 31.8 pg (ref 26.0–34.0)
MCHC: 31 g/dL (ref 30.0–36.0)
MCV: 102.6 fL — ABNORMAL HIGH (ref 80.0–100.0)
Platelets: 142 10*3/uL — ABNORMAL LOW (ref 150–400)
RBC: 3.52 MIL/uL — ABNORMAL LOW (ref 4.22–5.81)
RDW: 13.3 % (ref 11.5–15.5)
WBC: 5.1 10*3/uL (ref 4.0–10.5)
nRBC: 0 % (ref 0.0–0.2)

## 2018-08-17 LAB — COMPREHENSIVE METABOLIC PANEL
ALT: 16 U/L (ref 0–44)
AST: 33 U/L (ref 15–41)
Albumin: 3 g/dL — ABNORMAL LOW (ref 3.5–5.0)
Alkaline Phosphatase: 53 U/L (ref 38–126)
Anion gap: 11 (ref 5–15)
BUN: 24 mg/dL — ABNORMAL HIGH (ref 8–23)
CO2: 20 mmol/L — ABNORMAL LOW (ref 22–32)
Calcium: 9.1 mg/dL (ref 8.9–10.3)
Chloride: 108 mmol/L (ref 98–111)
Creatinine, Ser: 1.44 mg/dL — ABNORMAL HIGH (ref 0.61–1.24)
GFR calc Af Amer: 55 mL/min — ABNORMAL LOW (ref >60)
GFR calc non Af Amer: 47 mL/min — ABNORMAL LOW (ref >60)
Glucose, Bld: 92 mg/dL (ref 70–99)
Potassium: 5 mmol/L (ref 3.5–5.1)
Sodium: 139 mmol/L (ref 135–145)
Total Bilirubin: 1.2 mg/dL (ref 0.3–1.2)
Total Protein: 6.1 g/dL — ABNORMAL LOW (ref 6.5–8.1)

## 2018-08-17 LAB — CBG MONITORING, ED: Glucose-Capillary: 82 mg/dL (ref 70–99)

## 2018-08-17 LAB — AMMONIA: Ammonia: 137 umol/L — ABNORMAL HIGH (ref 9–35)

## 2018-08-17 MED ORDER — ALBUTEROL SULFATE (2.5 MG/3ML) 0.083% IN NEBU
2.5000 mg | INHALATION_SOLUTION | RESPIRATORY_TRACT | Status: DC | PRN
  Administered 2018-08-19: 2.5 mg via RESPIRATORY_TRACT
  Filled 2018-08-17: qty 3

## 2018-08-17 MED ORDER — LACTULOSE 10 GM/15ML PO SOLN
20.0000 g | Freq: Three times a day (TID) | ORAL | Status: DC
  Administered 2018-08-18: 20 g via ORAL
  Filled 2018-08-17 (×2): qty 30

## 2018-08-17 MED ORDER — SODIUM CHLORIDE 0.9% FLUSH
3.0000 mL | Freq: Two times a day (BID) | INTRAVENOUS | Status: DC
  Administered 2018-08-18 – 2018-08-20 (×6): 3 mL via INTRAVENOUS

## 2018-08-17 MED ORDER — VITAMIN B-12 1000 MCG PO TABS
1000.0000 ug | ORAL_TABLET | Freq: Every day | ORAL | Status: DC | PRN
  Filled 2018-08-17: qty 1

## 2018-08-17 MED ORDER — SODIUM CHLORIDE 0.9 % IV SOLN: 250.0000 mL | INTRAVENOUS | Status: DC | PRN

## 2018-08-17 MED ORDER — PANTOPRAZOLE SODIUM 40 MG PO TBEC
40.0000 mg | DELAYED_RELEASE_TABLET | Freq: Every day | ORAL | Status: DC
  Administered 2018-08-18 – 2018-08-20 (×3): 40 mg via ORAL
  Filled 2018-08-17 (×3): qty 1

## 2018-08-17 MED ORDER — ACETAMINOPHEN 650 MG RE SUPP: 650.0000 mg | Freq: Four times a day (QID) | RECTAL | Status: DC | PRN

## 2018-08-17 MED ORDER — MAGNESIUM OXIDE 400 (241.3 MG) MG PO TABS
400.0000 mg | ORAL_TABLET | Freq: Every day | ORAL | Status: DC
  Administered 2018-08-18 – 2018-08-20 (×3): 400 mg via ORAL
  Filled 2018-08-17 (×3): qty 1

## 2018-08-17 MED ORDER — LACTULOSE 10 GM/15ML PO SOLN
20.0000 g | Freq: Once | ORAL | Status: AC
  Administered 2018-08-17: 20 g via ORAL
  Filled 2018-08-17 (×2): qty 30

## 2018-08-17 MED ORDER — LEVOTHYROXINE SODIUM 50 MCG PO TABS
50.0000 ug | ORAL_TABLET | Freq: Every day | ORAL | Status: DC
  Administered 2018-08-18 – 2018-08-20 (×3): 50 ug via ORAL
  Filled 2018-08-17 (×3): qty 1

## 2018-08-17 MED ORDER — ACETAMINOPHEN 325 MG PO TABS
650.0000 mg | ORAL_TABLET | Freq: Four times a day (QID) | ORAL | Status: DC | PRN
  Administered 2018-08-18: 650 mg via ORAL
  Filled 2018-08-17: qty 2

## 2018-08-17 MED ORDER — SODIUM CHLORIDE 0.9% FLUSH: 3.0000 mL | INTRAVENOUS | Status: DC | PRN

## 2018-08-17 NOTE — ED Triage Notes (Signed)
BIB [facility] from home with c/o of AMS that started last night. Per EMS pt lives with sister and she noticed subtle changes in mentation and PCP said come to hospital. Denies pain and A&0 X 3 on arrival.

## 2018-08-17 NOTE — H&P (Addendum)
[facility] Admission History and Physical Service Pager: [phone]  Patient name: Justin Hampton Medical record number: [id number] Date of birth: [birth year] Age: 72 y.o. Gender: male  Primary Care Provider: Bartholome Bill, MD Consultants: None Code Status: Full  Chief Complaint: AMS  Assessment and Plan: Justin Hampton is a 72 y.o. male presenting with AMS. 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  Hepatic Encephalopathy Secondary to Hepatitis C, from blood transfusions for Hemophilia B.  Recent hospitalizations in August and September for AMS and found to have hepatic encephalopathy, due to non-compliance of lactulose treatment.  He is prescribed to take 20 g of lactulose 3 times daily as well as spironolactone 100 mg daily. Patient self-administers this at home but has underlying dementia and family are not aware of his adherence to meds. Ammonia on admission 137, oriented to self only, but is pleasant and answers questions.  At baseline patient is A&O x3 with some mild dementia and is unable to drive, walks with walker, otherwise performs own activities of daily living.   CT head on 9/20 showed advanced atrophy with chronic microvascular ischemic changes.  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  Slight confusion first noted last night, this morning when he came out of his room he was not dressed which is unusual for him and he was having difficulty navigating with his walker.  Sister does note that this is similar to his previous presentations but not as severe.  Given presentation and elevated ammonia this is likely secondary to hepatic encephalopathy.  SBP unlikely at present given lack of fever, white count, hypotension, abdominal pain.  Vital signs  all stable.  UTI cannot be ruled at this time as his UA is still pending although do not suspect given lack of urinary symptoms and lack of fever.  Infectious process unlikely given lack of fevers and WBC 5.1.  Acute CVA is less likely given lack of focal findings or speech. Could be worsening of patient's dementia, although this more consistent with acute delirium and would expect a more gradual progression with dementia.  Will be important during this admission to establish a plan of care as an outpatient for family and proper instructions for when to bring patient to the ED.  His sister is currently staying with him and his wife was just recently placed into a nursing home for advanced dementia. -Admit to [facility], Dr. Owens Shark service - Monitor vitals per floor protocol - CMP, CBC, TSH in a.m. - PT/OT eval -Social work consult for assistance with home situation - Urine analysis - Saline lock IV - Lactulose 20 g 3 times daily - Holding home spironolactone -Tylenol PRN pain - Regular diet -SCDs for DVT prophylaxis  CKD 3a Creatinine 1.44 on admission, baseline appears to be around 1.3 on chart review. -Continue to monitor creatinine -Patient euvolemic on exam, will hold off on fluids  Hemophilia B  Takes benefix prn for bleeding at home.  Hemoglobin 11.2, stable from previous admission at 11.6. -Continue to monitor CBC  Hypothyroidism Takes Synthroid 50mg  QD at home.  Last TSH not found on chart review. - Check TSH - cont home synthroid  GER Takes protonix at home. - cont home protonix  Vitamin B12 deficiency Takes 1,000 mcg prn for supplementation.  Last Vit B12  891 on 9/20. - cont home Vit B12 prn  FEN/GI: Regular Prophylaxis: SCDs  Disposition: Admit for observation  History of Present Illness:  Justin Hampton is a 72 y.o. male presenting with altered mental status.  Patient lives with the sister who is accompanying him and has provided the entire interview.  His sister  stated that last night he appeared to be "more confused."  She stated that upon waking this morning he did not come out of his room at the usual time.  She also states that when he did come out of his room he was not dressed he was on his pajamas, which is unlike him.  She states that he usually uses a walker, but was having difficulty navigating throughout the house with it this morning.  She called his PCP and was advised to bring him to the emergency department.    She notes that he has had 2 other hospitalizations for similar situations in August and September.  She states that these situations were more severe than his mental status at present.  He had previously been admitted with hepatic encephalopathy due to not taking his lactulose.  He is in charge of taking his own medications and she states that she always checks his pillbox during the day so she knows that he takes those, but she is unsure if he takes all of his lactulose.  She states that his hands are very "shaky" at baseline and that he usually has difficulty drinking the liquid.  She notes about 3 episodes of diarrhea per day.  Following his most recent discharge on 9/22 patient has been in normal health until last night.  She notes that at baseline he is A&O x3 with some mild dementia.  He is unable to drive now, and uses a walker, but is otherwise able to perform his activities of daily living on his own.  His sister began living with him after his first admission in August.  He had a very complex social situation with a wife with advanced stage Alzheimer's.  She was recently placed in a nursing facility.  In ED patient was noted to be pleasant, but alert and oriented only to self.  His ammonia level was found to be elevated to 137.  Patient was given 20 g of lactulose.   Review Of Systems: See HPI.  Review of Systems  Constitutional: Positive for malaise/fatigue. Negative for chills and fever.  HENT: Negative for congestion and sore  throat.   Eyes: Negative for blurred vision and double vision.  Respiratory: Negative for cough and shortness of breath.   Cardiovascular: Negative for chest pain and leg swelling.  Gastrointestinal: Positive for diarrhea. Negative for nausea and vomiting.  Genitourinary: Negative for dysuria, hematuria and urgency.  Musculoskeletal: Negative for falls and myalgias.  Skin: Negative for itching and rash.  Neurological: Negative for dizziness, tremors, speech change, focal weakness and headaches.  Psychiatric/Behavioral: Negative for substance abuse. The patient is not nervous/anxious.     Patient Active Problem List   Diagnosis Date Noted  . AMS (altered mental status) 08/17/2018  . Hepatic encephalopathy ([place]) 07/13/2018  . 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 CKD (chronic kidney disease) stage 3, GFR 30-59 ml/min (HCC) 07/13/2018  . Rectal bleeding 06/06/2017  . Acute renal failure (ARF) ([place]) 06/06/2017  . Hyponatremia 06/06/2017  . Hyperkalemia 06/06/2017  . Anemia 06/06/2017  . GI bleeding 06/06/2017  . 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[place])   . Dyspnea 07/08/2016  Past Medical History: Past Medical History:  Diagnosis Date  . 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 Hepatic encephalopathy ([place]) 04/12/2012  . Hepatitis C   . Hypoglycemia   . Hypothyroidism     Past Surgical History: Past Surgical History:  Procedure Laterality Date  . APPENDECTOMY    . COLONOSCOPY      Social History: Social History   Tobacco Use  . Smoking status: Never Smoker  . Smokeless tobacco: Never Used  Substance Use Topics  . Alcohol use: No  . Drug use: No   Additional social history: Currently living with his sister.  He has 2 sisters who both live in [place].  He has 1 brother who lives in [place].  [place]  He has a wife who was recently placed in a nursing home facility due to advanced dementia.  Family  History: Family History  Problem Relation Age of Onset  . Dementia Mother   . Hypertension Father   . Heart attack Father     Allergies and Medications: Allergies  Allergen Reactions  . Morphine Other (See Comments)    Caused confusion/During appendectomy  . Zolpidem Other (See Comments)    Confusion    No current facility-administered medications on file prior to encounter.    Current Outpatient Medications on File Prior to Encounter  Medication Sig Dispense Refill  . acetaminophen (TYLENOL) 500 MG tablet Take 500 mg by mouth every 6 (six) hours as needed for mild pain.    Marland Kitchen albuterol (PROVENTIL HFA;VENTOLIN HFA) 108 (90 Base) MCG/ACT inhaler Inhale 1-2 puffs into the lungs every 6 (six) hours as needed for wheezing or shortness of breath.    . coagulation factor IX, recomb, (BENEFIX) 1000 units injection Inject 4,000 Units into the vein See admin instructions. Whenever having surgery  As directed by hematologist    . lactulose (CHRONULAC) 10 GM/15ML solution Take 30 mLs (20 g total) by mouth 3 (three) times daily. 1892 mL 1  . levothyroxine (SYNTHROID, LEVOTHROID) 50 MCG tablet Take 50 mcg by mouth daily.    . magnesium oxide (MAG-OX) 400 (241.3 Mg) MG tablet Take 400 mg by mouth daily.    Verdell Face Oil-Hydrophil Petrolat OINT Apply 1 application topically daily as needed (skin care).    . pantoprazole (PROTONIX) 40 MG tablet Take 40 mg by mouth daily.  2  . spironolactone (ALDACTONE) 100 MG tablet Take 100 mg by mouth daily.     . vitamin B-12 (CYANOCOBALAMIN) 1000 MCG tablet Take 1,000 mcg by mouth daily as needed (for supplementary).     . Emollient (AQUAPHOR EX) Apply 1 application topically 2 (two) times daily as needed (for dry skin).     Marland Kitchen guaiFENesin (MUCINEX) 600 MG 12 hr tablet Take 600 mg by mouth 2 (two) times daily.    . methocarbamol (ROBAXIN) 500 MG tablet Take 1 tablet (500 mg total) by mouth 2 (two) times daily. (Patient not taking: Reported on 08/17/2018) 20  tablet 0    Objective: BP 124/90   Pulse 82   Temp 98.3 F (36.8 C) (Oral)   Resp 17   SpO2 97%   Physical Exam: General: 72 y.o. male in NAD HEENT: NCAT, sclera anicteric, EOMI, PERRL, MMM Cardio: RRR no m/r/g Lungs: CTAB, no wheezing, no rhonchi, no crackles Abdomen: Soft, mildly TTO of RUQ, positive bowel sounds Skin: warm and dry, no jaundice Extremities: Trace BLE pitting edema, 5/5 strength BUE/BLE  Neuro: A&Ox1, to self only, CN II-XII intact, sensation intact throughout Psych: pleasant, cooperative with exam   Labs and Imaging: CBC BMET  Recent Labs  Lab 08/17/18 1927  WBC 5.1  HGB 11.2*  HCT 36.1*  PLT 142*   Recent Labs  Lab 08/17/18 1927  NA 139  K 5.0  CL 108  CO2 20*  BUN 24*  CREATININE 1.44*  GLUCOSE 92  CALCIUM 9.1     No results found.  Cleophas Dunker, DO 08/17/2018, 11:53 PM PGY-1, [place] pager: [phone], text pages welcome  Upper Level Addendum: I have seen and evaluated this patient along with Dr. Sandi Carne and reviewed the above note, making necessary revisions in blue.  Harriet [place], [place], PGY-3

## 2018-08-17 NOTE — ED Notes (Signed)
ED Provider at bedside. 

## 2018-08-17 NOTE — ED Provider Notes (Signed)
[facility] EMERGENCY DEPARTMENT Provider Note   CSN: [id number] Arrival date & time: 08/17/18  1916     History   Chief Complaint Chief Complaint  Patient presents with  . Altered Mental Status    HPI Justin Hampton is a 72 y.o. male.  The history is provided by the patient and medical records. No language interpreter was used.  Altered Mental Status   Associated symptoms include confusion.   Justin Hampton is a 72 y.o. male  with a PMH of hepatic encephalopathy, cirrhosis, factor 9 deficiency who presents to the Emergency Department with sisters for evaluation of confusion which began last night. Per sisters, he seemed a little confused last night, but they made sure he took his night lactulose dose and he went to bed. He typically wakes up around 8 am. This morning at 10:30, they had not seem him yet, therefore sister went to the room. He was trying to get dressed, but had his pants on backwards and buttons out of line on his shirt. She helped get him dressed. She thought maybe he was just dehydrated, therefore got him some water and made him drink a few glasses. Symptoms did not improve. Finally decided he should come to ER for evaluation. Of note, sisters report similar experience last month in which he was told that this was due to his ammonia level getting to high. He is suppose to take 30 mg lactulose in am, 20 mid day and 20 before bed. Patient seems confused and not understanding the question when asking about compliance. Sisters feel like he is taking medication, but not quite sure. They do report that he always has a tremor and worry maybe some of the medication is not making it into the cup when he is pouring it in. No fall or trauma. No headaches, visual changes. Family report no slurred speech, facial asymmetry or weakness.    Past Medical History:  Diagnosis Date  . 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 Hepatic encephalopathy  ([place]) 04/12/2012  . Hepatitis C   . Hypoglycemia   . Hypothyroidism     Patient Active Problem List   Diagnosis Date Noted  . Hepatic encephalopathy ([place]) 07/13/2018  . 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 CKD (chronic kidney disease) stage 3, GFR 30-59 ml/min (HCC) 07/13/2018  . Rectal bleeding 06/06/2017  . Acute renal failure (ARF) ([place]) 06/06/2017  . Hyponatremia 06/06/2017  . Hyperkalemia 06/06/2017  . Anemia 06/06/2017  . GI bleeding 06/06/2017  . 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 Dyspnea 07/08/2016    Past Surgical History:  Procedure Laterality Date  . APPENDECTOMY    . COLONOSCOPY          Home Medications    Prior to Admission medications   Medication Sig Start Date End Date Taking? Authorizing Provider  coagulation factor IX, recomb, (BENEFIX) 1000 units injection Inject 4,000 Units into the vein See admin instructions. As directed by hematologist 07/28/16   [provider]  Emollient (AQUAPHOR EX) Apply 1 application topically 2 (two) times daily as needed (for dry skin).  12/08/17   [provider]  guaiFENesin (MUCINEX) 600 MG 12 hr tablet Take 600 mg by mouth 2 (two) times daily.    [provider]  lactulose (CHRONULAC) 10 GM/15ML solution Take 30 mLs (20 g total) by mouth 3 (three) times daily. 07/15/18   Kathi Ludwig, MD  levothyroxine (SYNTHROID, [place])  50 MCG tablet Take 50 mcg by mouth daily.    [provider]  methocarbamol (ROBAXIN) 500 MG tablet Take 1 tablet (500 mg total) by mouth 2 (two) times daily. 03/01/18   Lacretia Leigh, MD  pantoprazole (PROTONIX) 40 MG tablet Take 40 mg by mouth daily. 01/03/18   [provider]  spironolactone (ALDACTONE) 100 MG tablet Take 100 mg by mouth daily.  05/04/16   [provider]  vitamin B-12 (CYANOCOBALAMIN) 1000 MCG tablet Take 1,000 mcg by mouth daily as needed (for supplementary).  12/08/17   [provider]    Family  History Family History  Problem Relation Age of Onset  . Dementia Mother   . Hypertension Father   . Heart attack Father     Social History Social History   Tobacco Use  . Smoking status: Never Smoker  . Smokeless tobacco: Never Used  Substance Use Topics  . Alcohol use: No  . Drug use: No     Allergies   Morphine and Zolpidem   Review of Systems Review of Systems  Psychiatric/Behavioral: Positive for confusion.  All other systems reviewed and are negative.    Physical Exam Updated Vital Signs BP 136/75 (BP Location: Right Arm)   Pulse 88   Temp 98.3 F (36.8 C) (Oral)   Resp 18   SpO2 99%   Physical Exam  Constitutional: He appears well-developed and well-nourished. No distress.  HENT:  Head: Normocephalic and atraumatic.  Cardiovascular: Normal rate, regular rhythm and normal heart sounds.  No murmur heard. Pulmonary/Chest: Effort normal and breath sounds normal. No respiratory distress.  Abdominal: Soft. He exhibits no distension. There is no tenderness.  Musculoskeletal: Normal range of motion.  Neurological: He is alert.  Pleasant, but appears confused. Disoriented to place, time and recent events. CN 2-12 grossly intact. 5/5 muscle strength in all four extremities.  Skin: Skin is warm and dry.  Nursing note and vitals reviewed.    ED Treatments / Results  Labs (all labs ordered are listed, but only abnormal results are displayed) Labs Reviewed  COMPREHENSIVE METABOLIC PANEL - Abnormal; Notable for the following components:      Result Value   CO2 20 (*)    BUN 24 (*)    Creatinine, Ser 1.44 (*)    Total Protein 6.1 (*)    Albumin 3.0 (*)    GFR calc non Af Amer 47 (*)    GFR calc Af Amer 55 (*)    All other components within normal limits  CBC - Abnormal; Notable for the following components:   RBC 3.52 (*)    Hemoglobin 11.2 (*)    HCT 36.1 (*)    MCV 102.6 (*)    Platelets 142 (*)    All other components within normal limits   AMMONIA - Abnormal; Notable for the following components:   Ammonia 137 (*)    All other components within normal limits  URINALYSIS, ROUTINE W REFLEX MICROSCOPIC  CBG MONITORING, ED    EKG None  Radiology No results found.  Procedures Procedures (including critical care time)  Medications Ordered in ED Medications  lactulose (CHRONULAC) 10 GM/15ML solution 20 g (has no administration in time range)     Initial Impression / Assessment and Plan / ED Course  I have reviewed the triage vital signs and the nursing notes.  Pertinent labs & imaging results that were available during my care of the patient were reviewed by me and considered in my medical  decision making (see chart for details).    Justin Hampton is a 72 y.o. male who presents to ED for confusion noticed by his sister last night.  Per sisters, patient was admitted about a month ago with similar presentation.  Per chart review, his ammonia level was elevated and he was admitted for hepatic encephalopathy.  He is on lactulose 3 times a day.  Sisters believe he is compliant, but not quite sure.  No cranial nerve deficits on exam, but he is confused and disoriented.  Ammonia level today is elevated at 137.  Discussed case with teaching service who will admit.  Patient discussed with Dr. Roderic Palau who agrees with treatment plan.    Final Clinical Impressions(s) / ED Diagnoses   Final diagnoses:  Hepatic encephalopathy [facility])    ED Discharge Orders    None       Kendrew Paci, Ozella Almond, PA-C 08/17/18 2117    Milton Ferguson, MD 08/18/18 2116

## 2018-08-18 ENCOUNTER — Observation Stay (HOSPITAL_COMMUNITY): Payer: Medicare Other

## 2018-08-18 DIAGNOSIS — E039 Hypothyroidism, unspecified: Secondary | ICD-10-CM

## 2018-08-18 DIAGNOSIS — N183 Chronic kidney disease, stage 3 (moderate): Secondary | ICD-10-CM | POA: Diagnosis not present

## 2018-08-18 DIAGNOSIS — K7290 Hepatic failure, unspecified without coma: Secondary | ICD-10-CM | POA: Diagnosis not present

## 2018-08-18 DIAGNOSIS — I671 Cerebral aneurysm, nonruptured: Secondary | ICD-10-CM

## 2018-08-18 DIAGNOSIS — K219 Gastro-esophageal reflux disease without esophagitis: Secondary | ICD-10-CM | POA: Diagnosis not present

## 2018-08-18 LAB — CBC
HCT: 34.3 % — ABNORMAL LOW (ref 39.0–52.0)
Hemoglobin: 10.9 g/dL — ABNORMAL LOW (ref 13.0–17.0)
MCH: 32.6 pg (ref 26.0–34.0)
MCHC: 31.8 g/dL (ref 30.0–36.0)
MCV: 102.7 fL — ABNORMAL HIGH (ref 80.0–100.0)
Platelets: 137 10*3/uL — ABNORMAL LOW (ref 150–400)
RBC: 3.34 MIL/uL — ABNORMAL LOW (ref 4.22–5.81)
RDW: 13.2 % (ref 11.5–15.5)
WBC: 5.7 10*3/uL (ref 4.0–10.5)
nRBC: 0 % (ref 0.0–0.2)

## 2018-08-18 LAB — URINALYSIS, ROUTINE W REFLEX MICROSCOPIC
Bilirubin Urine: NEGATIVE
Glucose, UA: NEGATIVE mg/dL
Hgb urine dipstick: NEGATIVE
Ketones, ur: NEGATIVE mg/dL
Leukocytes, UA: NEGATIVE
Nitrite: NEGATIVE
Protein, ur: NEGATIVE mg/dL
Specific Gravity, Urine: 1.015 (ref 1.005–1.030)
pH: 6 (ref 5.0–8.0)

## 2018-08-18 LAB — COMPREHENSIVE METABOLIC PANEL
ALT: 17 U/L (ref 0–44)
AST: 25 U/L (ref 15–41)
Albumin: 2.9 g/dL — ABNORMAL LOW (ref 3.5–5.0)
Alkaline Phosphatase: 45 U/L (ref 38–126)
Anion gap: 7 (ref 5–15)
BUN: 22 mg/dL (ref 8–23)
CO2: 18 mmol/L — ABNORMAL LOW (ref 22–32)
Calcium: 8.8 mg/dL — ABNORMAL LOW (ref 8.9–10.3)
Chloride: 113 mmol/L — ABNORMAL HIGH (ref 98–111)
Creatinine, Ser: 1.32 mg/dL — ABNORMAL HIGH (ref 0.61–1.24)
GFR calc Af Amer: >60 mL/min (ref >60)
GFR calc non Af Amer: 52 mL/min — ABNORMAL LOW (ref >60)
Glucose, Bld: 84 mg/dL (ref 70–99)
Potassium: 3.9 mmol/L (ref 3.5–5.1)
Sodium: 138 mmol/L (ref 135–145)
Total Bilirubin: 1.9 mg/dL — ABNORMAL HIGH (ref 0.3–1.2)
Total Protein: 5.8 g/dL — ABNORMAL LOW (ref 6.5–8.1)

## 2018-08-18 LAB — TSH: TSH: 1.78 u[IU]/mL (ref 0.350–4.500)

## 2018-08-18 MED ORDER — LACTULOSE 10 GM/15ML PO SOLN
30.0000 g | Freq: Three times a day (TID) | ORAL | Status: DC
  Administered 2018-08-18 – 2018-08-20 (×8): 30 g via ORAL
  Filled 2018-08-18 (×8): qty 45

## 2018-08-18 NOTE — Social Work (Signed)
CSW acknowledging consult for SNF placement. Will follow for therapy recommendations.   Aleisha Paone H Drinda Belgard, LCSWA [place] Clinical Social Work [phone]   

## 2018-08-18 NOTE — Progress Notes (Signed)
Called for report @0548 

## 2018-08-18 NOTE — Progress Notes (Addendum)
Family Medicine Teaching Service Daily Progress Note Intern Pager: [phone]  Patient name: Justin Hampton Medical record number: [id number] Date of birth: April 24, 1946 Age: 72 y.o. Gender: male  Primary Care Provider: Bartholome Bill, MD Consultants: None Code Status: Full  Pt Overview and Major Events to Date:  10/25 Admitted to FPTS  Assessment and Plan: Justin Hampton is a 72 y.o. male presenting with AMS. 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  Hepatic Encephalopathy Secondary to Hepatitis C, from blood transfusions for Hemophilia B.  Hospitalizations in August and September for same.  UA negative.  Has received 20g Lactulose x2 since presentation to ED.  Mental status this AM improved, now A&Ox3.  Notes that he has been taking Lactulose as prescribed, 30g QAM, 20g in afternoon and QHS.  Suspect 2/2 inadequate dosing given reported compliance and failure on current regimen. - cont to monitor CMP - PT/OT eval - Social work consulted for assistance with home situation - f/u Urine analysis - increase Lactulose to 30g 3 times daily, will aim for 3 BM's per day - Holding home spironolactone, monitor fluid status -Tylenol PRN pain  CKD 3a Creatinine 1.44 on admission, baseline appears to be around 1.3 on chart review. -Continue to monitor creatinine -Patient euvolemic on exam, will hold off on fluids  Hemophilia B  Takes benefix prn for bleeding at home.  Hemoglobin 11.2, stable from previous admission at 11.6. -Continue to monitor CBC  Hypothyroidism Takes Synthroid 50mg  QD at home.  TSH 1.780. - cont home synthroid  GER Takes protonix at home. - cont home protonix  Vitamin B12 deficiency Takes 1,000 mcg prn for supplementation.  Last Vit B12 891 on 9/20. - cont home Vit B12 prn    FEN/GI: Regular PPx: SCDs  Disposition: pending clinical improvement  Subjective:  Patient feeling well  this AM without complaints.  Denies confusion.  Objective: Temp:  [98.3 F (36.8 C)] 98.3 F (36.8 C) (10/26 0615) Pulse Rate:  [79-108] 91 (10/26 0615) Resp:  [14-19] 18 (10/26 0615) BP: (108-136)/(60-90) 127/73 (10/26 0615) SpO2:  [96 %-100 %] 100 % (10/26 0615)  Physical Exam:  General: 72 y.o. male in NAD HEENT: Sclera anicteric Cardio: RRR no m/r/g Lungs: CTAB, no wheezing, no rhonchi, no crackles Abdomen: Soft, non-tender to palpation, positive bowel sounds Skin: warm and dry Extremities: No edema Neuro: A&Ox3, grossly intact Psych: clear speech, cooperative with exam  Laboratory: Recent Labs  Lab 08/17/18 1927 08/18/18 0509  WBC 5.1 5.7  HGB 11.2* 10.9*  HCT 36.1* 34.3*  PLT 142* 137*   Recent Labs  Lab 08/17/18 1927 08/18/18 0509  NA 139 138  K 5.0 3.9  CL 108 113*  CO2 20* 18*  BUN 24* 22  CREATININE 1.44* 1.32*  CALCIUM 9.1 8.8*  PROT 6.1* 5.8*  BILITOT 1.2 1.9*  ALKPHOS 53 45  ALT 16 17  AST 33 25  GLUCOSE 92 84   Ammonia 137  Imaging/Diagnostic Tests: No results found.  Justin Hampton, Justin Raisin, DO 08/18/2018, 6:38 AM PGY-1, [place] Intern pager: [phone], text pages welcome

## 2018-08-18 NOTE — Evaluation (Signed)
Physical Therapy Evaluation Patient Details Name: Justin Hampton MRN: [id number] DOB: August 05, 1946 Today's Date: 08/18/2018   History of Present Illness  Pt is a 72 y.o. male presenting with AMS. 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   Clinical Impression  PTA pt was independent with ambulation in home with RW, and able to perform bathing and dressing. Pt's sister is living with him and is providing assist with iADLs. Pt states he has just been d/c'd from HHPT from prior hospitalization and was to start Outpatient PT to work on higher level balance activities. Pt is currently limited in safe mobility by decreased safety awareness needing cuing for navigation of RW around obstacles in the room as well as mild instability. Pt is currently supervision with bed mobility, and min guard for transfers and ambulation. Pt is likely at his baseline level of function. Given his decreased cognition PT recommends 24 hr supervision and initialization of Outpatient PT at d/c to improve balance and DME training. PT will follow acutely.      Follow Up Recommendations Supervision/Assistance - 24 hour;Outpatient PT    Equipment Recommendations  None recommended by PT       Precautions / Restrictions Precautions Precautions: Fall Precaution Comments: fall in last 6 months Restrictions Weight Bearing Restrictions: No      Mobility  Bed Mobility Overal bed mobility: Needs Assistance Bed Mobility: Supine to Sit     Supine to sit: Supervision     General bed mobility comments: supervision for safety, increased time and effort and heavy use of bedrails to come to EoB  Transfers Overall transfer level: Needs assistance Equipment used: Rolling walker (2  wheeled) Transfers: Sit to/from Stand Sit to Stand: Min guard         General transfer comment: min guard for safety, vc for correct hand placement on bed and RW for power up, started process correctly and at last second pulled up with both hands on RW  Ambulation/Gait Ambulation/Gait assistance: Min guard Gait Distance (Feet): 180 Feet Assistive device: Rolling walker (2 wheeled) Gait Pattern/deviations: Step-through pattern;Shuffle;Trunk flexed Gait velocity: slowed Gait velocity interpretation: <1.8 ft/sec, indicate of risk for recurrent falls General Gait Details: hands on min guard for safety, gait slow and steady, however pt with poor navigation of RW bumping into items on both R and L, decreased head turn to notice obstacles despite cuing, and increased vc for proximity with RW      Balance Overall balance assessment: Needs assistance Sitting-balance support: Feet supported;No upper extremity supported Sitting balance-Leahy Scale: Fair     Standing balance support: Bilateral upper extremity supported Standing balance-Leahy Scale: Poor                               Pertinent Vitals/Pain Pain Assessment: No/denies pain    Home Living Family/patient expects to be discharged to:: Private residence Living Arrangements: Other relatives(sister staying with him) Available Help at Discharge: Family;Available 24 hours/day Type of Home: House Home Access: Stairs to enter Entrance Stairs-Rails: None Entrance Stairs-Number of Steps: 4 Home Layout: Two level;Bed/bath upstairs Home Equipment: Walker - 2 wheels      Prior Function Level of Independence: Needs assistance   Gait / Transfers Assistance  Needed: using RW  ADL's / Homemaking Assistance Needed: sister assists with cooking, cleaning, driving to appointments, independent in bathing and dressing           Extremity/Trunk Assessment   Upper Extremity Assessment Upper Extremity Assessment: Overall  WFL for tasks assessed    Lower Extremity Assessment Lower Extremity Assessment: Overall WFL for tasks assessed       Communication   Communication: No difficulties  Cognition Arousal/Alertness: Awake/alert Behavior During Therapy: WFL for tasks assessed/performed Overall Cognitive Status: History of cognitive impairments - at baseline                                 General Comments: A&O x 3, decreased safety awareness with use of RW,       General Comments General comments (skin integrity, edema, etc.): VSS, family not present to determine baseline level of ambulation      Assessment/Plan    PT Assessment Patient needs continued PT services  PT Problem List Decreased balance;Decreased mobility;Decreased cognition;Decreased knowledge of use of DME;Decreased safety awareness       PT Treatment Interventions DME instruction;Gait training;Stair training;Functional mobility training;Therapeutic activities;Therapeutic exercise;Balance training;Cognitive remediation;Patient/family education    PT Goals (Current goals can be found in the Care Plan section)  Acute Rehab PT Goals Patient Stated Goal: go home PT Goal Formulation: With patient Time For Goal Achievement: 09/01/18 Potential to Achieve Goals: Good    Frequency Min 3X/week    AM-PAC PT "6 Clicks" Daily Activity  Outcome Measure Difficulty turning over in bed (including adjusting bedclothes, sheets and blankets)?: A Lot Difficulty moving from lying on back to sitting on the side of the bed? : Unable Difficulty sitting down on and standing up from a chair with arms (e.g., wheelchair, bedside commode, etc,.)?: Unable Help needed moving to and from a bed to chair (including a wheelchair)?: A Little Help needed walking in hospital room?: A Little Help needed climbing 3-5 steps with a railing? : A Little 6 Click Score: 13    End of Session Equipment Utilized During Treatment: Gait belt Activity  Tolerance: Patient tolerated treatment well Patient left: in chair;with call bell/phone within reach;with chair alarm set Nurse Communication: Mobility status PT Visit Diagnosis: Unsteadiness on feet (R26.81);History of falling (Z91.81);Difficulty in walking, not elsewhere classified (R26.2)    Time: 4585-9292 PT Time Calculation (min) (ACUTE ONLY): 31 min   Charges:   PT Evaluation $PT Eval Moderate Complexity: 1 Mod PT Treatments $Gait Training: 8-22 mins        Shloka Baldridge B. Migdalia Dk PT, DPT Acute Rehabilitation Services Pager [phone] Office [phone]   [place] 08/18/2018, 8:43 AM

## 2018-08-18 NOTE — ED Notes (Signed)
Attempted report 

## 2018-08-19 ENCOUNTER — Other Ambulatory Visit: Payer: Self-pay

## 2018-08-19 DIAGNOSIS — K7290 Hepatic failure, unspecified without coma: Secondary | ICD-10-CM | POA: Diagnosis not present

## 2018-08-19 DIAGNOSIS — R410 Disorientation, unspecified: Secondary | ICD-10-CM | POA: Diagnosis not present

## 2018-08-19 DIAGNOSIS — Z8619 Personal history of other infectious and parasitic diseases: Secondary | ICD-10-CM

## 2018-08-19 DIAGNOSIS — N183 Chronic kidney disease, stage 3 (moderate): Secondary | ICD-10-CM | POA: Diagnosis not present

## 2018-08-19 DIAGNOSIS — I671 Cerebral aneurysm, nonruptured: Secondary | ICD-10-CM | POA: Diagnosis not present

## 2018-08-19 DIAGNOSIS — D67 Hereditary factor IX deficiency: Secondary | ICD-10-CM

## 2018-08-19 LAB — CBC
HCT: 34.3 % — ABNORMAL LOW (ref 39.0–52.0)
Hemoglobin: 11 g/dL — ABNORMAL LOW (ref 13.0–17.0)
MCH: 33 pg (ref 26.0–34.0)
MCHC: 32.1 g/dL (ref 30.0–36.0)
MCV: 103 fL — ABNORMAL HIGH (ref 80.0–100.0)
Platelets: 127 10*3/uL — ABNORMAL LOW (ref 150–400)
RBC: 3.33 MIL/uL — ABNORMAL LOW (ref 4.22–5.81)
RDW: 12.9 % (ref 11.5–15.5)
WBC: 4.8 10*3/uL (ref 4.0–10.5)
nRBC: 0 % (ref 0.0–0.2)

## 2018-08-19 LAB — COMPREHENSIVE METABOLIC PANEL
ALT: 17 U/L (ref 0–44)
AST: 27 U/L (ref 15–41)
Albumin: 2.7 g/dL — ABNORMAL LOW (ref 3.5–5.0)
Alkaline Phosphatase: 51 U/L (ref 38–126)
Anion gap: 9 (ref 5–15)
BUN: 21 mg/dL (ref 8–23)
CO2: 19 mmol/L — ABNORMAL LOW (ref 22–32)
Calcium: 9 mg/dL (ref 8.9–10.3)
Chloride: 110 mmol/L (ref 98–111)
Creatinine, Ser: 1.34 mg/dL — ABNORMAL HIGH (ref 0.61–1.24)
GFR calc Af Amer: 59 mL/min — ABNORMAL LOW (ref >60)
GFR calc non Af Amer: 51 mL/min — ABNORMAL LOW (ref >60)
Glucose, Bld: 82 mg/dL (ref 70–99)
Potassium: 4.1 mmol/L (ref 3.5–5.1)
Sodium: 138 mmol/L (ref 135–145)
Total Bilirubin: 1 mg/dL (ref 0.3–1.2)
Total Protein: 5.7 g/dL — ABNORMAL LOW (ref 6.5–8.1)

## 2018-08-19 NOTE — Progress Notes (Addendum)
Family Medicine Teaching Service Daily Progress Note Intern Pager: [phone]  Patient name: Justin Hampton Medical record number: [id number] Date of birth: 1945-12-23 Age: 72 y.o. Gender: male  Primary Care Provider: Bartholome Bill, MD Consultants: CSW Code Status: Full  Pt Overview and Major Events to Date:  10/25 Admitted to FPTS  Assessment and Plan: Justin Hampton is a 72 y.o. male presenting with AMS. 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  Confusion 2/2 Hepatic Encephalopathy: Improved, at baseline Secondary to Hepatitis C, from blood transfusions for Hemophilia B.  2 prior hospitalizations in August and September.  Given that patient had such an abrupt improvement, suspect that this may be multifactorial with Hepatic Encephalopathy but also a component of depression or dementia as patient's wife has advance dementia and was recently placed in nursing home.  Patient notes that he was not having 3 BMs daily prior to being admitted.  Will continue lactulose with dose of 30mg  TID as patient reports 3 BMs yesterday.  Advised that goal BMs is 3-5 daily.  - PT/OT eval: 24hr supervision and outpt PT recs, OT recs 24 hr supervision with mobility and ADLs - Social work consult for placement - Continue Lactulose 30 g 3 times daily with goal of 3-5 BMs daily  - Holding home spironolactone - Neuro as family is concerned about patient's tremor, and GI referral as outpatient as given cirrhosis he should be following with a hepatologist  CKD 3a: Chronic, stable 1.44>1.32>1.29.  Baseline ~1.3 on chart review -Continue to monitor creatinine -Patient euvolemic on exam, will hold off on fluids  Hemophilia B: Chronic, stable Hgb 11.2>10.9>11>11. Takes benefix prn for bleeding at home.    -Continue to monitor CBC  Hypothyroidism: Chronic, stable Takes Synthroid 50mg  QD at home.  TSH 1.780. - cont home  synthroid  GER: Chronic, stable Takes protonix at home. - cont home protonix  Vitamin B12 deficiency Takes 1,000 mcg prn for supplementation.  Last Vit B12 891 on 9/20. - cont home Vit B12 prn  FEN/GI: Regular Prophylaxis: SCDs  Disposition: d/c home today   Subjective:  Patient feeling well this AM.  At baseline per patient and family.  Objective: Temp:  [98 F (36.7 C)-98.1 F (36.7 C)] 98 F (36.7 C) (10/28 0357) Pulse Rate:  [72-100] 72 (10/28 0357) Resp:  [16-18] 16 (10/28 0357) BP: (103-112)/(70-88) 111/74 (10/28 0357) SpO2:  [97 %-100 %] 100 % (10/28 0357)  Physical Exam:  General: 72 y.o. male in NAD Cardio: RRR no m/r/g, sclera anicteric Lungs: CTAB, no wheezing, no rhonchi, no crackles Abdomen: Soft, non-tender to palpation, positive bowel sounds Skin: warm and dry Extremities: No edema   Laboratory: Recent Labs  Lab 08/18/18 0509 08/19/18 0649 08/20/18 0216  WBC 5.7 4.8 5.0  HGB 10.9* 11.0* 11.0*  HCT 34.3* 34.3* 33.9*  PLT 137* 127* 137*   Recent Labs  Lab 08/18/18 0509 08/19/18 0649 08/20/18 0216  NA 138 138 137  K 3.9 4.1 3.9  CL 113* 110 112*  CO2 18* 19* 20*  BUN 22 21 20   CREATININE 1.32* 1.34* 1.29*  CALCIUM 8.8* 9.0 8.8*  PROT 5.8* 5.7* 5.7*  BILITOT 1.9* 1.0 0.8  ALKPHOS 45 51 49  ALT 17 17 18   AST 25 27 28   GLUCOSE 84 82 94   Urinalysis    Component Value Date/Time   COLORURINE YELLOW 08/18/2018 [place] 08/18/2018 0454   LABSPEC 1.015 08/18/2018 0454  PHURINE 6.0 08/18/2018 0454   GLUCOSEU NEGATIVE 08/18/2018 0454   HGBUR NEGATIVE 08/18/2018 0454   BILIRUBINUR NEGATIVE 08/18/2018 [place] 08/18/2018 0454   PROTEINUR NEGATIVE 08/18/2018 0454   UROBILINOGEN 1.0 07/02/2015 1850   NITRITE NEGATIVE 08/18/2018 0454   LEUKOCYTESUR NEGATIVE 08/18/2018 0454     Imaging/Diagnostic Tests: Ct Head Wo Contrast  Result Date: 08/18/2018 CLINICAL DATA:  Unexplained altered level of  consciousness. EXAM: CT HEAD WITHOUT CONTRAST TECHNIQUE: Contiguous axial images were obtained from the base of the skull through the vertex without intravenous contrast. COMPARISON:  07/13/2018 FINDINGS: Brain: No evidence of acute infarction, hemorrhage, hydrocephalus, extra-axial collection or mass lesion/mass effect. Mild ventricular and sulcal enlargement is stable reflecting mild generalized atrophy. There are patchy areas of white matter hypoattenuation noted bilaterally, also stable consistent with mild to moderate chronic microvascular ischemic change. Vascular: No hyperdense vessel or unexpected calcification. Skull: Normal. Negative for fracture or focal lesion. Sinuses/Orbits: Normal globes and orbits. Mild ethmoid and minimal maxillary sinus mucosal thickening. Clear mastoid air cells. Other: None. IMPRESSION: 1. No acute intracranial abnormalities. 2. Mild atrophy and mild to moderate chronic microvascular ischemic change. Stable appearance from the recent prior exam. Electronically Signed   By: Lajean Manes M.D.   On: 08/18/2018 11:36     [place], Bernita Raisin, DO 08/20/2018, 8:26 AM PGY-1, [place] Intern pager: [phone], text pages welcome

## 2018-08-19 NOTE — Progress Notes (Signed)
Family Medicine Teaching Service Daily Progress Note Intern Pager: [phone]  Patient name: Justin Hampton Medical record number: [id number] Date of birth: 03-21-1946 Age: 72 y.o. Gender: male  Primary Care Provider: Bartholome Bill, MD Consultants: CSW Code Status: Full  Pt Overview and Major Events to Date:  Admitted to [facility] on 08/17/18  Assessment and Plan: Justin Hampton is a 72 y.o. male presenting with AMS. 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  Confusion 2/2 Hepatic Encephalopathy Improved. Secondary to Hepatitis C, from blood transfusions for Hemophilia B. Hospitalizations in August and September for same. Suspect 2/2 inadequate dosing given reported compliance and failure on current regimen. UA negative for signs of infection.  - PT/OT eval: 24hr supervision and outpt PT recs - Social work consult for placement - Continue Lactulose 30 g 3 times daily with goal of 3 BMs daily  - Holding home spironolactone  CKD 3a Cr 1.32 on 10/26, 1.44 on admission, baseline appears to be around 1.3 on chart review. -Continue to monitor creatinine -Patient euvolemic on exam, will hold off on fluids  Hemophilia B  Hgb 10.9 on 10/26, 11.2 on admission. Takes benefix prn for bleeding at home.    -Continue to monitor CBC  Hypothyroidism Takes Synthroid 50mg  QD at home.  TSH 1.780. - cont home synthroid  GER Takes protonix at home. - cont home protonix  Vitamin B12 deficiency Takes 1,000 mcg prn for supplementation.  Last Vit B12 891 on 9/20. - cont home Vit B12 prn  FEN/GI: Regular Prophylaxis: SCDs  Disposition: pending CSW placement   Subjective:  No acute events overnight.  Patient states he is doing well this morning.  No longer feeling very confused.  States that he thinks he he just needed to take some lactulose to get his ammonia down.  No further recommendations from clinical social work  regarding SNF placement.  Objective: Temp:  [97.8 F (36.6 C)-98.3 F (36.8 C)] 97.8 F (36.6 C) (10/27 0458) Pulse Rate:  [65-80] 65 (10/27 0458) Resp:  [16-20] 16 (10/27 0458) BP: (94-107)/(61-80) 106/79 (10/27 0458) SpO2:  [99 %-100 %] 100 % (10/27 0458) Physical Exam: General: Awake and alert.  Oriented x3.  No apparent distress. Cardiovascular: Regular rate and rhythm, no murmurs rubs or gallops Respiratory: Clear to auscultation bilaterally, no wheezes rales or rhonchi.  No increased work of breathing.  Able to speak full sentences. Abdomen: Soft, nontender, nondistended.  Bowel sounds normal. Extremities: No edema, Neuro: Cranial nerves II to XII grossly intact, oriented x3.  Laboratory: Recent Labs  Lab 08/17/18 1927 08/18/18 0509  WBC 5.1 5.7  HGB 11.2* 10.9*  HCT 36.1* 34.3*  PLT 142* 137*   Recent Labs  Lab 08/17/18 1927 08/18/18 0509  NA 139 138  K 5.0 3.9  CL 108 113*  CO2 20* 18*  BUN 24* 22  CREATININE 1.44* 1.32*  CALCIUM 9.1 8.8*  PROT 6.1* 5.8*  BILITOT 1.2 1.9*  ALKPHOS 53 45  ALT 16 17  AST 33 25  GLUCOSE 92 84   Urinalysis    Component Value Date/Time   COLORURINE YELLOW 08/18/2018 0454   APPEARANCEUR CLEAR 08/18/2018 0454   LABSPEC 1.015 08/18/2018 0454   PHURINE 6.0 08/18/2018 0454   GLUCOSEU NEGATIVE 08/18/2018 0454   HGBUR NEGATIVE 08/18/2018 0454   BILIRUBINUR NEGATIVE 08/18/2018 0454   KETONESUR NEGATIVE 08/18/2018 0454   PROTEINUR NEGATIVE 08/18/2018 0454   UROBILINOGEN 1.0 07/02/2015 1850   NITRITE NEGATIVE  08/18/2018 0454   LEUKOCYTESUR NEGATIVE 08/18/2018 0454     Imaging/Diagnostic Tests: Ct Head Wo Contrast  Result Date: 08/18/2018 CLINICAL DATA:  Unexplained altered level of consciousness. EXAM: CT HEAD WITHOUT CONTRAST TECHNIQUE: Contiguous axial images were obtained from the base of the skull through the vertex without intravenous contrast. COMPARISON:  07/13/2018 FINDINGS: Brain: No evidence of acute infarction,  hemorrhage, hydrocephalus, extra-axial collection or mass lesion/mass effect. Mild ventricular and sulcal enlargement is stable reflecting mild generalized atrophy. There are patchy areas of white matter hypoattenuation noted bilaterally, also stable consistent with mild to moderate chronic microvascular ischemic change. Vascular: No hyperdense vessel or unexpected calcification. Skull: Normal. Negative for fracture or focal lesion. Sinuses/Orbits: Normal globes and orbits. Mild ethmoid and minimal maxillary sinus mucosal thickening. Clear mastoid air cells. Other: None. IMPRESSION: 1. No acute intracranial abnormalities. 2. Mild atrophy and mild to moderate chronic microvascular ischemic change. Stable appearance from the recent prior exam. Electronically Signed   By: Lajean Manes M.D.   On: 08/18/2018 11:36     Caroline More, DO 08/19/2018, 6:30 AM PGY-2, [place] Intern pager: [phone], text pages welcome

## 2018-08-19 NOTE — Progress Notes (Signed)
Occupational Therapy Evaluation Patient Details Name: Justin Hampton MRN: [id number] DOB: [birth year] Today's Date: 08/19/2018    History of Present Illness Pt is a 72 y.o. male presenting with AMS. 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    Clinical Impression   PTA, pt lived at home and was independent with ADL and mobility, including IADL tasks. Pt recently placed his wife in SNF due to dementia. Pt making good progress. Sister present and states that her brother is "much improved but not yet 100%". Educated pt and sister on home safety and recommendations for S with all medication and financial management, mobility and ADL. Also recommend pt refrain from driving at this time. Sister verbalized understanding and states they will be able to provide S after DC. No further OT needs.     Follow Up Recommendations  Supervision/Assistance - 24 hour(S with mobility and ADL/IADL)    Equipment Recommendations  Other (comment)(grab bars/sister aware)    Recommendations for Other Services       Precautions / Restrictions Precautions Precautions: Fall Precaution Comments: fall in last 6 months Restrictions Weight Bearing Restrictions: No      Mobility Bed Mobility               General bed mobility comments: OOB in chair  Transfers Overall transfer level: Needs assistance   Transfers: Sit to/from Stand Sit to Stand: Supervision              Balance Overall balance assessment: Needs assistance Sitting-balance support: Feet supported;No upper extremity supported Sitting balance-Leahy Scale: Good     Standing balance support: Bilateral upper extremity supported Standing balance-Leahy Scale: Fair                    Standardized Balance Assessment Standardized Balance Assessment : Dynamic Gait Index   Dynamic Gait Index Level Surface: Mild Impairment Change in Gait Speed: Mild Impairment Gait with Horizontal Head Turns: Mild Impairment Gait with Vertical Head Turns: Mild Impairment Gait and Pivot Turn: Mild Impairment Step Over Obstacle: Mild Impairment Step Around Obstacles: Mild Impairment Steps: Mild Impairment Total Score: 16  Indicative of fall risk. Recommend use of RW initially after DC. Pt verbalized understanding.      ADL either performed or assessed with clinical judgement   ADL Overall ADL's : Needs assistance/impaired                                     Functional mobility during ADLs: Supervision/safety General ADL Comments: t overall S for ADL tasks; Educated pt/sister on home safety and reducing risk of falls; educated on recommendation for S with all medication adn financial mangement     Vision Baseline Vision/History: Wears glasses       Perception     Praxis      Pertinent Vitals/Pain Pain Assessment: No/denies pain     Hand Dominance Right   Extremity/Trunk Assessment Upper Extremity Assessment Upper Extremity Assessment: Overall WFL for tasks assessed   Lower Extremity Assessment Lower Extremity Assessment: Defer to PT evaluation   Cervical / Trunk Assessment Cervical / Trunk Assessment: Kyphotic;Other exceptions(forward head)   Communication Communication Communication: No difficulties   Cognition Arousal/Alertness:  Awake/alert Behavior During Therapy: WFL for tasks assessed/performed Overall Cognitive Status: Impaired/Different from baseline Area of Impairment: Awareness;Safety/judgement;Attention;Memory                   Current Attention Level: Selective Memory: Decreased short-term memory   Safety/Judgement: Decreased awareness of safety;Decreased awareness of deficits Awareness: Anticipatory   General Comments:  slow processing; difficulty with dual tasking; ableto verbalzie that he takes 5 medications; doe snot remember names; discussed his process for financial managemtn   General Comments       Exercises     Shoulder Instructions      Home Living Family/patient expects to be discharged to:: Private residence Living Arrangements: Other relatives Available Help at Discharge: Family;Available 24 hours/day Type of Home: House Home Access: Stairs to enter [employer] of Steps: 4 Entrance Stairs-Rails: None Home Layout: Two level;Bed/bath upstairs Alternate Level Stairs-Number of Steps: 16 Alternate Level Stairs-Rails: Right Bathroom Shower/Tub: Occupational psychologist: Standard Bathroom Accessibility: Yes How Accessible: Accessible via walker Home Equipment: Walker - 2 wheels          Prior Functioning/Environment Level of Independence: Needs assistance  Gait / Transfers Assistance Needed: using RW ADL's / Homemaking Assistance Needed: sister assists with cooking, cleaning, driving to appointments, independent in bathing and dressing   Comments: was getting HHPT        OT Problem List: Decreased activity tolerance;Impaired balance (sitting and/or standing);Decreased cognition      OT Treatment/Interventions: Self-care/ADL training;Therapeutic activities    OT Goals(Current goals can be found in the care plan section) Acute Rehab OT Goals Patient Stated Goal: go home OT Goal Formulation: All assessment and education complete, DC therapy  OT Frequency:     Barriers to D/C:            Co-evaluation              AM-PAC PT "6 Clicks" Daily Activity     Outcome Measure Help from another person eating meals?: None Help from another person taking care of personal grooming?: None Help from another person toileting, which includes using toliet, bedpan, or urinal?: None Help from another person bathing (including washing, rinsing, drying)?: None Help  from another person to put on and taking off regular upper body clothing?: None Help from another person to put on and taking off regular lower body clothing?: None 6 Click Score: 24   End of Session Equipment Utilized During Treatment: Gait belt Nurse Communication: Mobility status  Activity Tolerance: Patient tolerated treatment well Patient left: in chair;with call bell/phone within reach;with family/visitor present  OT Visit Diagnosis: Unsteadiness on feet (R26.81);Other symptoms and signs involving cognitive function                Time: 2376-2831 OT Time Calculation (min): 22 min Charges:  OT General Charges $OT Visit: 1 Visit OT Evaluation $OT Eval Low Complexity: [place], OT/L   Acute OT Clinical Specialist Acute Rehabilitation Services Pager [phone] Office [phone]   [facility] 08/19/2018, 3:40 PM

## 2018-08-20 DIAGNOSIS — F0150 Vascular dementia without behavioral disturbance: Secondary | ICD-10-CM

## 2018-08-20 DIAGNOSIS — K219 Gastro-esophageal reflux disease without esophagitis: Secondary | ICD-10-CM | POA: Diagnosis not present

## 2018-08-20 DIAGNOSIS — R410 Disorientation, unspecified: Secondary | ICD-10-CM | POA: Diagnosis not present

## 2018-08-20 DIAGNOSIS — N183 Chronic kidney disease, stage 3 (moderate): Secondary | ICD-10-CM | POA: Diagnosis not present

## 2018-08-20 DIAGNOSIS — K7290 Hepatic failure, unspecified without coma: Secondary | ICD-10-CM | POA: Diagnosis not present

## 2018-08-20 DIAGNOSIS — K7469 Other cirrhosis of liver: Secondary | ICD-10-CM

## 2018-08-20 LAB — COMPREHENSIVE METABOLIC PANEL
ALT: 18 U/L (ref 0–44)
AST: 28 U/L (ref 15–41)
Albumin: 2.8 g/dL — ABNORMAL LOW (ref 3.5–5.0)
Alkaline Phosphatase: 49 U/L (ref 38–126)
Anion gap: 5 (ref 5–15)
BUN: 20 mg/dL (ref 8–23)
CO2: 20 mmol/L — ABNORMAL LOW (ref 22–32)
Calcium: 8.8 mg/dL — ABNORMAL LOW (ref 8.9–10.3)
Chloride: 112 mmol/L — ABNORMAL HIGH (ref 98–111)
Creatinine, Ser: 1.29 mg/dL — ABNORMAL HIGH (ref 0.61–1.24)
GFR calc Af Amer: >60 mL/min (ref >60)
GFR calc non Af Amer: 54 mL/min — ABNORMAL LOW (ref >60)
Glucose, Bld: 94 mg/dL (ref 70–99)
Potassium: 3.9 mmol/L (ref 3.5–5.1)
Sodium: 137 mmol/L (ref 135–145)
Total Bilirubin: 0.8 mg/dL (ref 0.3–1.2)
Total Protein: 5.7 g/dL — ABNORMAL LOW (ref 6.5–8.1)

## 2018-08-20 LAB — CBC
HCT: 33.9 % — ABNORMAL LOW (ref 39.0–52.0)
Hemoglobin: 11 g/dL — ABNORMAL LOW (ref 13.0–17.0)
MCH: 32.5 pg (ref 26.0–34.0)
MCHC: 32.4 g/dL (ref 30.0–36.0)
MCV: 100.3 fL — ABNORMAL HIGH (ref 80.0–100.0)
Platelets: 137 10*3/uL — ABNORMAL LOW (ref 150–400)
RBC: 3.38 MIL/uL — ABNORMAL LOW (ref 4.22–5.81)
RDW: 12.8 % (ref 11.5–15.5)
WBC: 5 10*3/uL (ref 4.0–10.5)
nRBC: 0 % (ref 0.0–0.2)

## 2018-08-20 MED ORDER — LACTULOSE 10 GM/15ML PO SOLN: 30.0000 g | Freq: Three times a day (TID) | ORAL | 0 refills | Status: DC

## 2018-08-20 NOTE — Discharge Instructions (Signed)
Continue taking Lactulose 30g three times a day.  Your goal for bowel movements should be 3-5 a day.  If you are having less or more than that, please call your doctor.  If you are noticing confusion, that is also another reason to call your doctor.  Hepatic Encephalopathy Hepatic encephalopathy is a loss of brain function from advanced liver disease. The effects of the condition depend on the type of liver damage and how severe it is. In some cases, hepatic encephalopathy can be reversed. What are the causes? The exact cause of hepatic encephalopathy is not known. What increases the risk? You have a higher risk of getting this condition if your liver is damaged. When the liver is damaged harmful substances called toxins can build up in the body. Certain toxins, such as ammonia, can harm your brain. Conditions that can cause liver damage include:  An infection.  Dehydration.  Intestinal bleeding.  Drinking too much alcohol.  Taking certain medicines, including tranquilizers, water pills (diuretics), antidepressants, or sleeping pills.  What are the signs or symptoms? Signs and symptoms may develop suddenly. Or, they may develop slowly and get worse gradually. Symptoms can range from mild to severe. Mild Hepatic Encephalopathy  Mild confusion.  Personality and mood changes.  Anxiety and agitation.  Drowsiness.  Loss of mental abilities.  Musty or sweet-smelling breath. Worsening or Severe Hepatic Encephalopathy  Slowed movement.  Slurred speech.  Extreme personality changes.  Disorientation.  Abnormal shaking or flapping of the hands.  Coma. How is this diagnosed? To make a diagnosis, your health care provider will do a physical exam. To rule out other causes of your signs and symptoms, he or she may order tests. You may have:  Blood tests. These may be done to check your ammonia level, measure how long it takes your blood to clot, and check for infection.  Liver  function tests. These may be done to check how well your liver is working.  MRI and CT scans. These may be done to check for a brain disorder.  Electroencephalogram (EEG). This may be done to measure the electrical activity in your brain.  How is this treated? The first step in treatment is identifying and treating possible triggers. The next step is involves taking medicine to lower the level of toxins in the body and to prevent ammonia from building up. You may need to take:  Antibiotics to reduce the ammonia-producing bacteria in your gut.  Lactulose to help flush ammonia from the gut.  Follow these instructions at home: Eating and drinking  Follow a low-protein diet that includes plenty of fruits, vegetables, and whole grains, as directed by your health care provider. Ammonia is produced when you digest high-protein foods.  Work with a Microbiologist or with your health care provider to make sure you are getting the right balance of protein and minerals.  Drink enough fluids to keep your urine clear or pale yellow. Drinking plenty of water helps prevent constipation.  Do not drink alcohol or use illegal drugs. Medicines  Only take medicine as directed by your health care provider.  If you were prescribed an antibiotic medicine, finish it all even if you start to feel better.  Do not start any new medicines, including over-the-counter medicines, without first checking with your health care provider. Contact a health care provider if:  You have new symptoms.  Your symptoms change.  Your symptoms get worse.  You have a fever.  You are constipated.  You have  persistent nausea, vomiting, or diarrhea. Get help right away if:  You become very confused or drowsy.  You vomit blood or material that looks like coffee grounds.  Your stool is bloody or black or looks like tar. This information is not intended to replace advice given to you by your health care provider. Make sure  you discuss any questions you have with your health care provider. Document Released: 12/20/2006 Document Revised: 03/17/2016 Document Reviewed: 05/28/2014 Elsevier Interactive Patient Education  2018 [employer].

## 2018-08-20 NOTE — Discharge Summary (Signed)
[facility] Discharge Summary  Patient name: Justin Hampton Medical record number: [id number] Date of birth: [birth year] Age: 72 y.o. Gender: male Date of Admission: 08/17/2018  Date of Discharge: 08/20/2018 Admitting Physician: Justin Bing, DO  Primary Care Provider: Bartholome Bill, MD Consultants: None  Indication for Hospitalization: AMS  Discharge Diagnoses/Problem List:  Confusion 2/2 Hepatic Encephalopathy CKD 3a Hemophilia B Hypothyroidism GER Vitamin B12 Deficiency   Disposition: home  Discharge Condition: stable  Discharge Exam:   General: 72 y.o. male in NAD Cardio: RRR no m/r/g, sclera anicteric Lungs: CTAB, no wheezing, no rhonchi, no crackles Abdomen: Soft, non-tender to palpation, positive bowel sounds Skin: warm and dry Extremities: No edema  Brief Hospital Course:  Justin Hampton a 72 y.o.malepresenting with AMS. PMH is significant forcirrhosis 2/2 treated Hepatitis C, CKD, hemophilia B,CKD3a, GER,and hypothyroidism.  His hospital course is outlined below.  Altered mental status Patient has a history of hepatitis C secondary to blood transfusions for hemophilia.  He has had 2 prior hospitalizations in both August and September of this year with hepatic encephalopathy.  His ammonia on admission was elevated to 137.  CT head was negative for acute abnormalities.  Aside from confusion, patient's neurologic exam is without deficit on admission.  Other admission details can be found in H&P.  On admission patient was treated with lactulose and mental status improved very quickly.  He was originally on lactulose at home 30g QAM, 20g in afternoon, and 20g QHS.  He later noted that he had not been having 3 bowel movements per day on this for patient's home regimen was increased to 30 g 3 times daily.  He was advised that he should have a goal of 3-5 bowel movements per day.  Patient did improve with lactulose treatment, it is  unlikely that this was the only factor attributing to his altered mental status.  Such an quick improvement would likely not be seen in hepatic encephalopathy.  It is likely that his altered mental status was multifactorial.  Components of dementia and depression may also have been attributing to the patient's overall picture.  The patient's wife has advanced stage dementia and was recently placed in a nursing home.  This is been causing the patient quite a large amount of stress.  He also has baseline mild dementia.  It is recommended that he and his family continue to have ongoing conversations regarding the patient's mental status, as long term, it may become clear the major factor in his abrupt mental status changes.  He was referred to outpatient Neurology for vascular dementia.  He was also referred to outpatient GI as he does not currently follow with a hepatologist.  His vital signs remained stable throughout his admission.  He was stable and without complaint at his mental baseline at the time of discharge.  His other chronic medical conditions were addressed and treated as appropriate.  Issues for Follow Up:  1. Held spironolactone on discharge asa patient with low normal BP.  PCP to consider restarting for accumulation of ascites. 2. Will need to have continued discussion with family and patient as to long term plan for patient as his sisters are currently helping care for him.  3. Lactulose increased to 30g TID with goal of 3-5 bowel movements per day. 4. Patient is to follow-up as an outpatient with GI as he is not managed by hepatologist for his cirrhosis secondary to hepatitis C. 5. Patient is also to follow-up with neurology as  an outpatient for his vascular dementia.  Family also noted a concern about a tremor.  No tremor was noted on exam.   Significant Procedures: None  Significant Labs and Imaging:  Recent Labs  Lab 08/18/18 0509 08/19/18 0649 08/20/18 0216  WBC 5.7 4.8 5.0   HGB 10.9* 11.0* 11.0*  HCT 34.3* 34.3* 33.9*  PLT 137* 127* 137*   Recent Labs  Lab 08/17/18 1927 08/18/18 0509 08/19/18 0649 08/20/18 0216  NA 139 138 138 137  K 5.0 3.9 4.1 3.9  CL 108 113* 110 112*  CO2 20* 18* 19* 20*  GLUCOSE 92 84 82 94  BUN 24* 22 21 20   CREATININE 1.44* 1.32* 1.34* 1.29*  CALCIUM 9.1 8.8* 9.0 8.8*  ALKPHOS 53 45 51 49  AST 33 25 27 28   ALT 16 17 17 18   ALBUMIN 3.0* 2.9* 2.7* 2.8*    Ct Head Wo Contrast  Result Date: 08/18/2018 CLINICAL DATA:  Unexplained altered level of consciousness. EXAM: CT HEAD WITHOUT CONTRAST TECHNIQUE: Contiguous axial images were obtained from the base of the skull through the vertex without intravenous contrast. COMPARISON:  07/13/2018 FINDINGS: Brain: No evidence of acute infarction, hemorrhage, hydrocephalus, extra-axial collection or mass lesion/mass effect. Mild ventricular and sulcal enlargement is stable reflecting mild generalized atrophy. There are patchy areas of white matter hypoattenuation noted bilaterally, also stable consistent with mild to moderate chronic microvascular ischemic change. Vascular: No hyperdense vessel or unexpected calcification. Skull: Normal. Negative for fracture or focal lesion. Sinuses/Orbits: Normal globes and orbits. Mild ethmoid and minimal maxillary sinus mucosal thickening. Clear mastoid air cells. Other: None. IMPRESSION: 1. No acute intracranial abnormalities. 2. Mild atrophy and mild to moderate chronic microvascular ischemic change. Stable appearance from the recent prior exam. Electronically Signed   By: Justin Hampton M.D.   On: 08/18/2018 11:36   Results/Tests Pending at Time of Discharge: None  Discharge Medications:  Allergies as of 08/20/2018      Reactions   Morphine Other (See Comments)   Caused confusion/During appendectomy   Zolpidem Other (See Comments)   Confusion      Medication List    STOP taking these medications   guaiFENesin 600 MG 12 hr tablet Commonly known  as:  MUCINEX   methocarbamol 500 MG tablet Commonly known as:  ROBAXIN   spironolactone 100 MG tablet Commonly known as:  ALDACTONE     TAKE these medications   acetaminophen 500 MG tablet Commonly known as:  TYLENOL Take 500 mg by mouth every 6 (six) hours as needed for mild pain.   albuterol 108 (90 Base) MCG/ACT inhaler Commonly known as:  PROVENTIL HFA;VENTOLIN HFA Inhale 1-2 puffs into the lungs every 6 (six) hours as needed for wheezing or shortness of breath.   AQUAPHOR EX Apply 1 application topically 2 (two) times daily as needed (for dry skin).   BENEFIX 1000 units injection Generic drug:  coagulation factor IX (recomb) Inject 4,000 Units into the vein See admin instructions. Whenever having surgery  As directed by hematologist   lactulose 10 GM/15ML solution Commonly known as:  CHRONULAC Take 45 mLs (30 g total) by mouth 3 (three) times daily. What changed:  how much to take   levothyroxine 50 MCG tablet Commonly known as:  SYNTHROID, LEVOTHROID Take 50 mcg by mouth daily.   magnesium oxide 400 (241.3 Mg) MG tablet Commonly known as:  MAG-OX Take 400 mg by mouth daily.   Mineral Oil-Hydrophil Petrolat Oint Apply 1 application topically daily as needed (  skin care).   pantoprazole 40 MG tablet Commonly known as:  PROTONIX Take 40 mg by mouth daily.   vitamin B-12 1000 MCG tablet Commonly known as:  CYANOCOBALAMIN Take 1,000 mcg by mouth daily as needed (for supplementary).       Discharge Instructions: Please refer to Patient Instructions section of EMR for full details.  Patient was counseled important signs and symptoms that should prompt return to medical care, changes in medications, dietary instructions, activity restrictions, and follow up appointments.   Follow-Up Appointments: Follow-up Information    Justin Bill, MD. Call.   Specialty:  Family Medicine Why:  ASAP to make appointment for 1 week Contact information: [place] [address] [place] Follow up.   Why:  home health Contact information: [place] [address] [phone]           Cleophas Dunker, DO 08/21/2018, 7:51 PM PGY-1, [place]

## 2018-08-20 NOTE — Social Work (Signed)
CSW aware pt discharging home with home health; and the supervision and support of his sister. Pt's wife has been placed in facility therefore he is not taking care of her at home. Given pt's current functional status he is not appropriate for skilled care.   CSW signing off. Please consult if any additional needs arise.  [place], [place] Work [phone]

## 2018-08-20 NOTE — Plan of Care (Signed)
  Problem: Health Behavior/Discharge Planning: Goal: Ability to manage health-related needs will improve Outcome: Progressing   Problem: Clinical Measurements: Goal: Diagnostic test results will improve Outcome: Progressing   

## 2018-08-20 NOTE — Care Management Note (Signed)
Case Management Note  Patient Details  Name: Justin Hampton MRN: [id number] Date of Birth: Mar 10, 1946  Subjective/Objective:                    Action/Plan:  Discussed discharge planning with patient and his sister Justin Hampton at bedside. Patient will be discharging to his address in [facility]. His sisters Justin Hampton and Justin Hampton will be taking turns staying with him.  Patient and Justin Hampton requesting to speak to a MD regarding stage 3 renal failure. Paged family medicine pager asked for home health orders RN PT and face to face and gave message regarding Stage 3 Renal Failure.MD will speak to patient and sister Expected Discharge Date:                  Expected Discharge Plan:  [place]  In-House Referral:     Discharge planning Services  CM Consult  Post Acute Care Choice:  Home Health Choice offered to:  Patient, Sibling  DME Arranged:  N/A DME Agency:  NA  HH Arranged:  RN, PT, Disease Management [place] Agency:  [place]  Status of Service:  Completed, signed off  If discussed at [place] of Stay Meetings, dates discussed:    Additional Comments:  Marilu Favre, RN 08/20/2018, 1:09 PM

## 2018-08-20 NOTE — Progress Notes (Signed)
Patient given discharge instructions and patient verbalized understanding.

## 2018-08-20 NOTE — Progress Notes (Signed)
Physical Therapy Treatment Patient Details Name: Justin Hampton MRN: [id number] DOB: [birth year] Today's Date: 08/20/2018    History of Present Illness Pt is a 72 y.o. male presenting with AMS. 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     PT Comments    Pt sitting in chair on entry, eager to complete stair training so that he can discharge home. Pt sisters and daughter present during session and caregiver sister observed stair training to aide in reinforcement of technique at home. Pt is supervision for transfers, and min guard for ambulation without AD. Pt requires min guard to min A for stair ascent and descent. Pt requires min guard for forward step to pattern for ascent and sideways step to descent with both hands on rail. PT to receive HHPT at discharge to improve balance and mobility in his home environment.      Follow Up Recommendations  Supervision/Assistance - 24 hour;Home health PT     Equipment Recommendations  None recommended by PT    Recommendations for Other Services       Precautions / Restrictions Precautions Precautions: Fall Precaution Comments: fall in last 6 months Restrictions Weight Bearing Restrictions: No    Mobility  Bed Mobility               General bed mobility comments: OOB in chair  Transfers Overall transfer level: Needs assistance   Transfers: Sit to/from Stand Sit to Stand: Supervision         General transfer comment: supervision for safety, good power up and steadying  Ambulation/Gait Ambulation/Gait assistance: Min guard Gait Distance (Feet): 250 Feet Assistive device: None Gait Pattern/deviations: Step-through pattern;Shuffle;Trunk flexed Gait velocity: slowed Gait  velocity interpretation: <1.8 ft/sec, indicate of risk for recurrent falls General Gait Details: min guard for safety with slow mildly unsteady gait, vc for upright posture and not looking at his feet,    Stairs Stairs: Yes Stairs assistance: Min guard;Min assist Stair Management: One rail Left;Alternating pattern;Step to pattern;Forwards;Sideways Number of Stairs: 13 General stair comments: min guard for ascent, initially forward step over step pattern, vc for step to pattern to improve steadiness, min A for initial forward descent, vc for sideways step to pattern to descend, then required hands on min guard for safety       Balance Overall balance assessment: Needs assistance Sitting-balance support: Feet supported;No upper extremity supported Sitting balance-Leahy Scale: Good     Standing balance support: No upper extremity supported;During functional activity Standing balance-Leahy Scale: Fair                   Standardized Balance Assessment Standardized Balance Assessment : Dynamic Gait Index   Dynamic Gait Index Level Surface: Mild Impairment Change in Gait Speed: Mild Impairment Gait with Horizontal Head Turns: Mild Impairment Gait with Vertical Head Turns: Mild Impairment Gait and Pivot Turn: Mild Impairment Step Over Obstacle: Mild Impairment Step Around Obstacles: Mild Impairment Steps: Mild Impairment Total Score: 16      Cognition Arousal/Alertness: Awake/alert Behavior During Therapy: WFL for tasks assessed/performed Overall Cognitive Status: Impaired/Different from baseline Area of Impairment: Awareness;Safety/judgement;Attention;Memory                   Current Attention Level: Selective Memory: Decreased short-term memory  Safety/Judgement: Decreased awareness of safety;Decreased awareness of deficits Awareness: Anticipatory   General Comments: slow processing; difficulty with dual tasking; ableto verbalzie that he takes 5  medications; doe snot remember names; discussed his process for financial managemtn      Exercises      General Comments General comments (skin integrity, edema, etc.): Pt's sisters and daughter in room during session, caregiver sister observed stair training      Pertinent Vitals/Pain Pain Assessment: No/denies pain           PT Goals (current goals can now be found in the care plan section) Acute Rehab PT Goals Patient Stated Goal: go home PT Goal Formulation: With patient Time For Goal Achievement: 09/01/18 Potential to Achieve Goals: Good Progress towards PT goals: Progressing toward goals    Frequency    Min 3X/week      PT Plan Discharge plan needs to be updated       AM-PAC PT "6 Clicks" Daily Activity  Outcome Measure  Difficulty turning over in bed (including adjusting bedclothes, sheets and blankets)?: A Little Difficulty moving from lying on back to sitting on the side of the bed? : A Lot Difficulty sitting down on and standing up from a chair with arms (e.g., wheelchair, bedside commode, etc,.)?: A Little Help needed moving to and from a bed to chair (including a wheelchair)?: A Little Help needed walking in hospital room?: A Little Help needed climbing 3-5 steps with a railing? : A Little 6 Click Score: 17    End of Session Equipment Utilized During Treatment: Gait belt Activity Tolerance: Patient tolerated treatment well Patient left: in chair;with call bell/phone within reach;with family/visitor present;with chair alarm set Nurse Communication: Mobility status PT Visit Diagnosis: Unsteadiness on feet (R26.81);History of falling (Z91.81);Difficulty in walking, not elsewhere classified (R26.2)     Time: 1031-5945 PT Time Calculation (min) (ACUTE ONLY): 18 min  Charges:  $Gait Training: 8-22 mins                     Justin Hampton B. Migdalia Dk PT, DPT Acute Rehabilitation Services Pager [phone] Office [phone]    [place] 08/20/2018, 4:11 PM

## 2018-08-20 NOTE — Progress Notes (Signed)
Initial Nutrition Assessment  DOCUMENTATION CODES:   Not applicable  INTERVENTION:   - Limit protein to 12-14% of calories  NUTRITION DIAGNOSIS:   Food and nutrition related knowledge deficit related to limited prior education as evidenced by per patient/family report.  GOAL:   Patient will meet greater than or equal to 90% of their needs  MONITOR:   PO intake, Labs  REASON FOR ASSESSMENT:   Consult Assessment of nutrition requirement/status  ASSESSMENT:   72 yo male, admitted with AMS. 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 Pt prescribed 20 g lactulose TID + spironolactone 100 mg daily - questionable adherence given dementia. Uses walker.  Labs: chloride 112, Creatinine 1.29 (down from 1.44 on admission), GFR 54, corrected Ca WNL, tProtein 5.7, Hgb 11.0, Hct 22.9. MCV 100.3 Meds: levothyroxine, Mag-ox, protonix, vitamin B-12 ordered  Pt seated in chair at time of visit. Sister present.   Unable to state UBW, but thinks he has gained some weight since last admission, has gone up a pants size. Reports height as 5'9".   Describes his appetite as good and normal. Pt had eggs, [place] toast, and orange juice for breakfast. At home, pt typically eats 3 meals/day plus snacks. Does not follow any special diet and takes vitamin B-12 by prescription.   Denies nausea or vomiting, constipation, or difficulty chewing or swallowing. Endorses some diarrhea yesterday, but this problem has resolved today.   Pt and sister were unaware of CKD III diagnosis in chart. Recommended discussing this with physician as soon as possible. Recommended limiting sodium intake given h/o edema.  Nutrition Education Note  Reviewed food groups and provided written recommended serving sizes specifically determined for patient's current nutritional status.   Explained why diet  restrictions are needed and provided lists of foods to limit/avoid that are high potassium, sodium, and phosphorus. Provided specific recommendations on safer alternatives of these foods. Strongly encouraged compliance of this diet.   Discussed importance of protein intake at each meal and snack. Provided examples of how to maximize protein intake throughout the day. Discussed need for fluid restriction with dialysis, importance of minimizing weight gain between HD treatments, and renal-friendly beverage options.  Encouraged pt to discuss specific diet questions/concerns with RD at HD outpatient facility. Teach back method used.  Expect fair compliance.  Body mass index is 29.59 kg/m. Pt meets criteria for overweight based on current BMI.  Current diet order is regular, patient is consuming approximately 100% of meals at this time. Labs and medications reviewed. No further nutrition interventions warranted at this time. RD contact information provided. If additional nutrition issues arise, please re-consult RD.  NUTRITION - FOCUSED PHYSICAL EXAM:   Most Recent Value  Orbital Region  Mild depletion  Upper Arm Region  Mild depletion  Thoracic and Lumbar Region  No depletion  Buccal Region  No depletion  Temple Region  Moderate depletion  Clavicle Bone Region  No depletion  Clavicle and Acromion Bone Region  No depletion  Scapular Bone Region  No depletion  Dorsal Hand  Mild depletion  Patellar Region  No depletion  Anterior Thigh Region  No depletion  Posterior Calf Region  Mild depletion  Edema (RD Assessment)  None  Hair  Reviewed  Eyes  Reviewed  Mouth  Reviewed  Skin  Reviewed  Nails  Other (Comment)     Diet Order:  100% intake, per nsg documentation Diet Order  Diet regular Room service appropriate? Yes; Fluid consistency: Thin  Diet effective now             EDUCATION NEEDS:  Education needs have been addressed  Skin:  Skin Assessment: Skin Integrity  Issues: Skin Integrity Issues:: Other (Comment) Other: MASD groin, sacrum  Last BM:  10/27, type 5  Height:  Ht Readings from Last 1 Encounters:  08/20/18 5\' 9"  (1.753 m)    Weight:  Wt Readings from Last 1 Encounters:  08/20/18 90.9 kg    Ideal Body Weight:  72.7 kg  BMI:  Body mass index is 29.59 kg/m.  Estimated Nutritional Needs:   Kcal:  3406-8403 kcal/day(25-30 kcal/kg IBW)  Protein:  54-73 g protein/day(0.6-0.8 g/kg ABW)  Fluid:  1 mL/kcal or per MD discretion  Althea Grimmer, MS, RDN, LDN On-call pager: [phone]

## 2018-08-22 ENCOUNTER — Encounter: Payer: Self-pay | Admitting: Neurology

## 2018-08-22 ENCOUNTER — Encounter: Payer: Self-pay | Admitting: Gastroenterology

## 2018-09-15 ENCOUNTER — Other Ambulatory Visit: Payer: Self-pay | Admitting: Family Medicine

## 2018-09-24 ENCOUNTER — Ambulatory Visit (INDEPENDENT_AMBULATORY_CARE_PROVIDER_SITE_OTHER): Payer: Medicare Other | Admitting: Gastroenterology

## 2018-09-24 ENCOUNTER — Other Ambulatory Visit (INDEPENDENT_AMBULATORY_CARE_PROVIDER_SITE_OTHER): Payer: Medicare Other

## 2018-09-24 ENCOUNTER — Encounter: Payer: Self-pay | Admitting: Gastroenterology

## 2018-09-24 VITALS — BP 122/82 | HR 80 | Ht 69.0 in | Wt 225.1 lb

## 2018-09-24 DIAGNOSIS — K7460 Unspecified cirrhosis of liver: Secondary | ICD-10-CM

## 2018-09-24 DIAGNOSIS — Z8669 Personal history of other diseases of the nervous system and sense organs: Secondary | ICD-10-CM | POA: Diagnosis not present

## 2018-09-24 DIAGNOSIS — B182 Chronic viral hepatitis C: Secondary | ICD-10-CM

## 2018-09-24 DIAGNOSIS — D67 Hereditary factor IX deficiency: Secondary | ICD-10-CM | POA: Diagnosis not present

## 2018-09-24 LAB — BASIC METABOLIC PANEL
BUN: 18 mg/dL (ref 6–23)
CO2: 24 mEq/L (ref 19–32)
Calcium: 8.8 mg/dL (ref 8.4–10.5)
Chloride: 109 mEq/L (ref 96–112)
Creatinine, Ser: 1.19 mg/dL (ref 0.40–1.50)
GFR: 63.72 mL/min (ref >60.00)
Glucose, Bld: 107 mg/dL — ABNORMAL HIGH (ref 70–99)
Potassium: 4.3 mEq/L (ref 3.5–5.1)
Sodium: 139 mEq/L (ref 135–145)

## 2018-09-24 LAB — HEPATIC FUNCTION PANEL
ALT: 15 U/L (ref 0–53)
AST: 21 U/L (ref 0–37)
Albumin: 3.4 g/dL — ABNORMAL LOW (ref 3.5–5.2)
Alkaline Phosphatase: 73 U/L (ref 39–117)
Bilirubin, Direct: 0.2 mg/dL (ref 0.0–0.3)
Total Bilirubin: 0.9 mg/dL (ref 0.2–1.2)
Total Protein: 6.8 g/dL (ref 6.0–8.3)

## 2018-09-24 LAB — CBC
HCT: 33.6 % — ABNORMAL LOW (ref 39.0–52.0)
Hemoglobin: 11.3 g/dL — ABNORMAL LOW (ref 13.0–17.0)
MCHC: 33.7 g/dL (ref 30.0–36.0)
MCV: 101.1 fl — ABNORMAL HIGH (ref 78.0–100.0)
Platelets: 159 10*3/uL (ref 150.0–400.0)
RBC: 3.32 Mil/uL — ABNORMAL LOW (ref 4.22–5.81)
RDW: 13.4 % (ref 11.5–15.5)
WBC: 5 10*3/uL (ref 4.0–10.5)

## 2018-09-24 LAB — PROTIME-INR
INR: 1.2 ratio — ABNORMAL HIGH (ref 0.8–1.0)
Prothrombin Time: 13.6 s — ABNORMAL HIGH (ref 9.6–13.1)

## 2018-09-24 NOTE — Patient Instructions (Addendum)
You have been scheduled for an abdominal ultrasound at [facility] (1st floor of hospital) on Tuesday, 10/02/18 at 9:30 am. Please arrive 15 minutes prior to your appointment for registration. Make certain not to have anything to eat or drink 6 hours prior to your appointment. Should you need to reschedule your appointment, please contact radiology at [phone]. This test typically takes about 30 minutes to perform.  Your provider has requested that you go to the basement level for lab work before leaving today. Press "B" on the elevator. The lab is located at the first door on the left as you exit the elevator.  You have been scheduled for an endoscopy. Please follow written instructions given to you at your visit today. If you use inhalers (even only as needed), please bring them with you on the day of your procedure. Your physician has requested that you go to www.startemmi.com and enter the access code given to you at your visit today. This web site gives a general overview about your procedure. However, you should still follow specific instructions given to you by our office regarding your preparation for the procedure.  We will check with Dr Lanell Persons, your hematologist prior to your endoscopy procedure to see if anything special needs to be done regarding your prep.  If you are age 86 or older, your body mass index should be between 23-30. Your Body mass index is 33.25 kg/m. If this is out of the aforementioned range listed, please consider follow up with your Primary Care Provider.  If you are age 36 or younger, your body mass index should be between 19-25. Your Body mass index is 33.25 kg/m. If this is out of the aformentioned range listed, please consider follow up with your Primary Care Provider.

## 2018-09-24 NOTE — Progress Notes (Addendum)
[place] VISIT   Primary Care Provider Bartholome Bill, MD [place] [address] [phone]  Referring Provider Cleophas Dunker, DO [place] [address] [place],  [address] [phone]  Patient Profile: Justin Hampton is a 72 y.o. 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  The patient presents to the [facility] for an evaluation and management of problem(s) noted below:  Problem List 1. 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[place])   2. History of encephalopathy     History of Present Illness: This is the patient's first visit to the outpatient of our GI clinic.  He is previously been followed and [place] by digestive health specialists and [facility].  He is now moved to [place] and now is seeking to establish care with GI here in [place].  He was previously seen in 2018 by my colleague Dr. Fuller Plan as an unassigned patient when he presented with FOBT positive stool.  He eventually was transferred to [facility] because of his hemophilia. In the brief time that we had with the patient (still lasted greater than 25 minutes), patient states that he was diagnosed with hemophilia and required clotting factors for years due to issues of bleeding.  Eventually was diagnosed with hepatitis C and he describes being treated with interferon in the past.  He describes having a negative viral load when last evaluated.  He was previously followed by Dr. Arsenio Loader at [facility] up until 2018.  He was no longer able to see Dr. Arsenio Loader and was seen a nurse practitioner PA and he did not find that as revealing as when he was seeing his physician.  He describes having issues with portosystemic encephalopathy for the last year and 1/2 to 2 years.  His last  hospitalization was just couple months ago and he had his lactulose titrated.  On his current dose of lactulose he is having between 3-5 bowel movements per day.  There is no hematochezia.  There are no dark stools.  Patient thinks his last endoscopy was in 2018.  Last colonoscopy was years ago and he does not know that neither does his sister.   Currently the patient is feeling well.    The patient denies any issues with jaundice, scleral icterus, pruritus, darkened/amber urine, clay-colored stools, LE edema, hematemesis, coffee-ground emesis, abdominal distention, confusion, new generalized pruritus.  GI Review of Systems Positive as above Negative for dysphagia, odynophagia, abdominal pain, early satiety, weight loss, melena, hematochezia  Review of Systems General: Denies fevers/chills HEENT: Denies oral lesions Cardiovascular: Denies chest pain Pulmonary: Denies shortness of breath Gastroenterological: See HPI Genitourinary: Denies darkened urine Hematological: Positive for easy bruising/bleeding Endocrine: Denies temperature intolerance Dermatological: Denies jaundice Psychological: Mood is stable Musculoskeletal: Denies new arthralgias   Medications Current Outpatient Medications  Medication Sig Dispense Refill  . acetaminophen (TYLENOL) 500 MG tablet Take 500 mg by mouth every 6 (six) hours as needed for mild pain.    Marland Kitchen albuterol (PROVENTIL HFA;VENTOLIN HFA) 108 (90 Base) MCG/ACT inhaler Inhale 1-2 puffs into the lungs every 6 (six) hours as needed for wheezing or shortness of breath.    . coagulation factor IX, recomb, (BENEFIX) 1000 units injection Inject 4,000 Units into the vein See admin instructions. Whenever having surgery  As directed by hematologist    . Emollient (AQUAPHOR EX) Apply 1 application topically 2 (two) times daily  as needed (for dry skin).     Marland Kitchen lactulose (CHRONULAC) 10 GM/15ML solution Take 45 mLs (30 g total) by mouth 3 (three) times daily. 4050 mL 0  .  levothyroxine (SYNTHROID, LEVOTHROID) 50 MCG tablet Take 50 mcg by mouth daily.    . magnesium oxide (MAG-OX) 400 (241.3 Mg) MG tablet Take 400 mg by mouth daily.    Verdell Face Oil-Hydrophil Petrolat OINT Apply 1 application topically daily as needed (skin care).    . pantoprazole (PROTONIX) 40 MG tablet Take 40 mg by mouth daily.  2  . spironolactone (ALDACTONE) 25 MG tablet Take 25 mg by mouth daily.    . vitamin B-12 (CYANOCOBALAMIN) 1000 MCG tablet Take 1,000 mcg by mouth daily as needed (for supplementary).      No current facility-administered medications for this visit.     Allergies Allergies  Allergen Reactions  . Morphine Other (See Comments)    Caused confusion/During appendectomy  . Zolpidem Other (See Comments)    Confusion     Histories Past Medical History:  Diagnosis Date  . Adenomatous colon polyp   . 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 Cirrhosis ([place])   . Diverticulosis   . GERD (gastroesophageal reflux disease)   . 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[place])   . 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 Hepatitis C   . Hypoglycemia   . Hypothyroidism    Past Surgical History:  Procedure Laterality Date  . APPENDECTOMY    . CARPAL TUNNEL RELEASE Bilateral   . COLONOSCOPY     Social History   Socioeconomic History  . Marital status: Married    Spouse name: Not on file  . Number of children: 2  . Years of education: Not on file  . Highest education level: Not on file  Occupational History  . Occupation: retired  Scientific laboratory technician  . Financial resource strain: Not on file  . Food insecurity:    Worry: Not on file    Inability: Not on file  . Transportation needs:    Medical: Not on file    Non-medical: Not on file  Tobacco Use  . Smoking status: Former Smoker    Last attempt to quit: 08/1995    Years since quitting: 23.1  . Smokeless tobacco: Never Used  Substance and Sexual Activity  . Alcohol use: Not Currently  . Drug use: No  . Sexual  activity: Not on file  Lifestyle  . Physical activity:    Days per week: Not on file    Minutes per session: Not on file  . Stress: Not on file  Relationships  . Social connections:    Talks on phone: Not on file    Gets together: Not on file    Attends religious service: Not on file    Active member of club or organization: Not on file    Attends meetings of clubs or organizations: Not on file    Relationship status: Not on file  . Intimate partner violence:    Fear of current or ex partner: Not on file    Emotionally abused: Not on file    Physically abused: Not on file    Forced sexual activity: Not on file  Other Topics Concern  . Not on file  Social History Narrative  . Not on file   Family History  Problem Relation Age of Onset  . Dementia Mother 28  . Heart failure Mother   . Osteoarthritis Mother   .  Hypertension Mother   . Hypertension Father   . Heart attack Father   . Breast cancer Sister   . Uterine cancer Sister   . Hypertension Sister   . Hemophilia Brother   . Heart disease Brother   . Hypertension Brother   . Hypothyroidism Sister   . Osteoarthritis Sister   . Heart failure Maternal Grandmother   . Stroke Maternal Grandmother   . Hypertension Maternal Grandmother   . Heart failure Maternal Grandfather   . Diabetes Paternal Grandfather   . Colon cancer Neg Hx   . Esophageal cancer Neg Hx   . Inflammatory bowel disease Neg Hx   . Liver disease Neg Hx   . Pancreatic cancer Neg Hx   . Rectal cancer Neg Hx   . Stomach cancer Neg Hx    I have reviewed his medical, social, and family history in detail and updated the electronic medical record as necessary.    PHYSICAL EXAMINATION  BP 122/82 (BP Location: Left Arm, Patient Position: Sitting, Cuff Size: Normal)   Pulse 80   Ht 5\' 9"  (1.753 m)   Wt 225 lb 2 oz (102.1 kg)   BMI 33.25 kg/m   Wt Readings from Last 3 Encounters:  09/24/18 225 lb 2 oz (102.1 kg)  08/20/18 200 lb 6.4 oz (90.9 kg)    07/13/18 200 lb (90.7 kg)  GEN: NAD, appears older than stated age, appears chronically ill, accompanied by sister PSYCH: Cooperative, without pressured speech EYE: Conjunctivae pink, sclerae anicteric ENT: MMM, without oral ulcers, no erythema or exudates noted NECK: Supple CV: RR without R/Gs  RESP: Decreased breath sounds at the bases bilaterally GI: NABS, soft, rounded, obese, NT/ND, without rebound or guarding, hepatomegaly is appreciated splenomegaly is not appreciated MSK/EXT: Bilateral lower extremity edema present SKIN: Spider angiomata on upper thorax are noted; no jaundice NEURO:  Alert & Oriented x 3, no focal deficits, no evidence of asterixis   REVIEW OF DATA  I reviewed the following data at the time of this encounter:  GI Procedures and Studies  March 2013 colonoscopy A normal-appearing cecum, IC valve, appendiceal orifice were identified.  The ascending, transverse, descending, sigmoid colon, rectum appeared unremarkable.  Diverticular disease with no hemorrhage was seen (not clear where this was seen).  Retroflexed views revealed no abnormalities.  Plan for repeat colonoscopy in 5 years due to a personal history of colon polyps.  2016 EGD Report in care everywhere it is unable to be visualized but reading through hospitals physician at that time for evaluation of peptic ulcer disease it was reported that he had a normal upper endoscopy  Laboratory Studies  Reviewed in epic  Imaging Studies  2015    ASSESSMENT  Mr. Albarran is a 72 y.o. 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  The patient is seen today for evaluation and management of:  1. 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[place])   2. History of encephalopathy    Is a patient who has underlying decompensated cirrhosis in the setting of recurrent episodes of portosystemic  encephalopathy.  He seems to be doing well and is stable on current dosing of lactulose.  We will make no adjustments and I am just related to the patient and his sister to make sure that he has had about 3-4 bowel movements per day if we have more he may have the development of electrolyte disturbances that could cause him  to develop encephalopathy.  We will check his blood counts to see where things are right now.  Regards to his underlying etiology for cirrhosis is been presumed to be due to hepatitis C although that was treated years ago.  I like to see his immunization status of hepatitis A and hepatitis B.  I also like to evaluate the patient with an upper endoscopy to evaluate for any varices as he has not had that done in a few years.  He also does not have any imaging of his abdomen over the course of the last few years either.  We does need to at least get an ultrasound to begin this work-up to ensure he has not developed an [place].  His diuretic dose is also been adjusted by his primary care provider and will not make any adjustments on that today but he will monitor his weights as well his sister to ensure that they are not fluctuating significantly in which point we may need to consider diuretic titration.  Since he has underlying hemophilia before and endoscopy can be performed he needs to be evaluated by his hematologist and/or have his recommendations for preprocedure factor infusion known before any type of procedure.  We will work on a hospital endoscopy in the coming weeks and we will need to have hematology approval for any factors or recommendations for factor before.  If not he will need to be referred to the hematology group here so that we can have an understanding of what factors he will need before a diagnostic endoscopy.He reports not drinking any alcohol and that is important but if he were to have any chance of him continuing to be as stable as he is for the last few weeks he needs to  maintain complete alcohol cessation.  The risks and benefits of endoscopic evaluation were discussed with the patient; these include but are not limited to the risk of perforation, infection, bleeding, missed lesions, lack of diagnosis, severe illness requiring hospitalization, as well as anesthesia and sedation related illnesses.  The patient is agreeable to proceed.  We are going to hold on colonoscopy based on his current status.   PLAN  Laboratory work-up as below Ultrasound abdomen to evaluate for [facility] Evaluate his hep A and hep B status for immunization Plan for endoscopy after we understand what factors he may need prior to diagnostic procedure to evaluate for varices Maintain current dose of lactulose to have at least 3-4 bowel movements per day (try to minimize anything more than 4 bowel movements per day) - We will have to consider rifaximin and/or zinc if patient has recurrent episodes of PSE He is not a candidate for evaluation for transplantation at this point  Orders Placed This Encounter  Procedures  . US Abdomen Complete  . CBC  . Basic metabolic panel  . Hepatic function panel  . TSH  . Cortisol  . INR/PT  . AFP tumor marker  . Hepatitis A antibody, total  . Hepatitis B surface antibody,qualitative  . Hepatitis B surface antigen  . Hepatitis B core antibody, total  . Ambulatory referral to Gastroenterology    New Prescriptions   No medications on file   Modified Medications   No medications on file    Planned Follow Up: No follow-ups on file.   Justice Britain, MD [facility] Advanced Endoscopy Office # [phone]

## 2018-09-25 ENCOUNTER — Encounter: Payer: Self-pay | Admitting: Gastroenterology

## 2018-09-25 LAB — HEPATITIS B CORE ANTIBODY, TOTAL: Hep B Core Total Ab: REACTIVE — AB

## 2018-09-25 LAB — HEPATITIS B SURFACE ANTIBODY,QUALITATIVE: Hep B S Ab: NONREACTIVE

## 2018-09-25 LAB — HEPATITIS A ANTIBODY, TOTAL: Hepatitis A AB,Total: REACTIVE — AB

## 2018-09-25 LAB — AFP TUMOR MARKER: AFP-Tumor Marker: 1.1 ng/mL (ref <6.1)

## 2018-09-25 LAB — HEPATITIS B SURFACE ANTIGEN: Hepatitis B Surface Ag: NONREACTIVE

## 2018-09-25 LAB — TSH: TSH: 1.11 u[IU]/mL (ref 0.35–4.50)

## 2018-09-25 LAB — CORTISOL: Cortisol, Plasma: 3.8 ug/dL

## 2018-09-28 ENCOUNTER — Other Ambulatory Visit: Payer: Self-pay

## 2018-09-28 ENCOUNTER — Encounter: Payer: Self-pay | Admitting: Gastroenterology

## 2018-09-28 DIAGNOSIS — R768 Other specified abnormal immunological findings in serum: Secondary | ICD-10-CM | POA: Insufficient documentation

## 2018-09-28 DIAGNOSIS — K7460 Unspecified cirrhosis of liver: Secondary | ICD-10-CM

## 2018-09-28 DIAGNOSIS — Z8669 Personal history of other diseases of the nervous system and sense organs: Secondary | ICD-10-CM | POA: Insufficient documentation

## 2018-09-28 DIAGNOSIS — B182 Chronic viral hepatitis C: Secondary | ICD-10-CM

## 2018-09-28 MED ORDER — PHYTONADIONE 5 MG PO TABS: 10.0000 mg | ORAL_TABLET | Freq: Every day | ORAL | 0 refills | Status: AC

## 2018-10-01 ENCOUNTER — Other Ambulatory Visit: Payer: Self-pay

## 2018-10-02 ENCOUNTER — Encounter: Payer: Self-pay | Admitting: Gastroenterology

## 2018-10-02 ENCOUNTER — Ambulatory Visit (HOSPITAL_COMMUNITY)
Admission: RE | Admit: 2018-10-02 | Discharge: 2018-10-02 | Disposition: A | Discharge status: Home / Self Care | Payer: Medicare Other | Source: Ambulatory Visit | Attending: Gastroenterology | Admitting: Gastroenterology

## 2018-10-02 ENCOUNTER — Other Ambulatory Visit: Payer: Self-pay | Admitting: Family Medicine

## 2018-10-02 ENCOUNTER — Telehealth: Payer: Self-pay

## 2018-10-02 DIAGNOSIS — K7460 Unspecified cirrhosis of liver: Secondary | ICD-10-CM | POA: Diagnosis present

## 2018-10-02 DIAGNOSIS — Z8669 Personal history of other diseases of the nervous system and sense organs: Secondary | ICD-10-CM | POA: Diagnosis present

## 2018-10-02 DIAGNOSIS — B182 Chronic viral hepatitis C: Secondary | ICD-10-CM

## 2018-10-02 DIAGNOSIS — K8689 Other specified diseases of pancreas: Secondary | ICD-10-CM | POA: Insufficient documentation

## 2018-10-02 NOTE — Telephone Encounter (Signed)
Dr Vida Rigger number [phone].  Call placed on hold for 8 min.  Will try again later

## 2018-10-02 NOTE — Telephone Encounter (Signed)
The pt called and is asking if you have you contacted Dr Lanell Persons about the Benefix injections before the endoscopy in January?

## 2018-10-02 NOTE — Telephone Encounter (Signed)
When we had spoken in clinic, I asked that they reach out to his Hematologist. Looks like a bit of a miscommunication. Patty, can you find out this week his Hematologist and see what we would need Factor wise before a diagnostic endoscopy with possible variceal banding if found? Thank you as always. Chester Holstein

## 2018-10-02 NOTE — Telephone Encounter (Signed)
[name], [name], MD  Timothy Lasso, RN        Justin Hampton,  Please let patient and sister know that liver has appearance of cirrhosis but no evidence of a G[place].  He does have some slight pancreatic duct dilation.  I would like to change his hospital based EGD to an EGD/EUS for evaluation of his Pancreas as well at time of procedure in the hospital so we can evaluate the pancreas as well.  Can you let patient know about this when able.  Thanks.  GM

## 2018-10-02 NOTE — Telephone Encounter (Signed)
Left message on machine to call back  

## 2018-10-03 ENCOUNTER — Other Ambulatory Visit: Payer: Self-pay | Admitting: Family Medicine

## 2018-10-04 ENCOUNTER — Other Ambulatory Visit (HOSPITAL_COMMUNITY): Payer: Self-pay | Admitting: Family Medicine

## 2018-10-04 NOTE — Telephone Encounter (Signed)
I have again contacted Dr Vida Rigger office and have spoken to G[place]ord who states that she will place a phone call note to Dr Lanell Persons asking for his advice on special considerations for endoscopy. In addition, she has asked that we send our original fax to another fax attn:Dr Lanell Persons, f [phone]. This fax has been completed and I will await a response within the next couple days.

## 2018-10-04 NOTE — Telephone Encounter (Signed)
The pt has been advised and EUS has been added to the case.  The pt will call Dr Vida Rigger office about the Benefix.

## 2018-10-04 NOTE — Telephone Encounter (Signed)
Thank you for the updates. GM

## 2018-10-04 NOTE — Telephone Encounter (Signed)
I was working with Dr Rush Landmark on the day of patient's last visit and did send a letter to Dr Lanell Persons both by Sallee Provencal (see letters tab) and by manual fax (fax number [phone]) at 5:39 pm 09/24/18. I will continue to work on getting a response from Dr Vida Rigger office as I have not yet had any success with this.

## 2018-10-05 ENCOUNTER — Ambulatory Visit (INDEPENDENT_AMBULATORY_CARE_PROVIDER_SITE_OTHER): Payer: Medicare Other | Admitting: Gastroenterology

## 2018-10-05 DIAGNOSIS — Z23 Encounter for immunization: Secondary | ICD-10-CM | POA: Diagnosis not present

## 2018-10-05 DIAGNOSIS — K7460 Unspecified cirrhosis of liver: Secondary | ICD-10-CM

## 2018-10-11 NOTE — Telephone Encounter (Signed)
I have again contacted Dr Vida Rigger office to request clearance for patient's endoscopy on 10/29/18 and to ask about any special considerations. I spoke to Clinton who states she will take another message indicating that we have sent several notes and have sent several messages and ask that we receive a call or fax back.

## 2018-10-18 NOTE — Telephone Encounter (Signed)
Thank you Patty for getting this information. Will the patient be bring the Factor or will we be obtaining the Factor from our Pharmacy? I placed RN TL Honeycutt on this as well. Thanks. GM

## 2018-10-18 NOTE — Telephone Encounter (Signed)
We have received correspondence from Dr Lanell Persons with the following information:  "[name] (DOB 08/10/1946) has mild factor IX deficiency (hemophilia B) who is scheduled for an endoscopy by Dr Justice Britain of [facility] on 10/29/2018. His current weight is 103 kg.  1. 30-60 minutes prior to incision, please administer 4000 units of Benefix (recombinant FIX) units (round to nearest available vial size) IV push over 3-5 minutes in pre-operative holding room. He will be instructed to bring two 4000 unit does to the procedure and his home factor can be administered with the extra dose available for any unexpected major bleeding. 2. If any procedures are performed during the endoscopic procedure (such as biopsies or dilation procedures) that may result in some tissue trauma and bleeding, it is recommended that the treat on POD #1 with an additional dose of 4000 units of Benefix. He can perform this at home as he usually would for on-demand treatment of acute bleeding episodes. 3. Page Camillia Herter through the [place] if any questions or problems arise that would require input from a hematologist.  Santiago Bur, MD Hematology and Oncology 10/15/2018 8:51 AM"  I have left a message for patient to call back so I can be sure that he picks up the medication.

## 2018-10-22 NOTE — Telephone Encounter (Signed)
The pt is suppose to bring with him to the procedure appt.

## 2018-10-22 NOTE — Telephone Encounter (Signed)
I have spoken to patient and he indicates that he has a supply of Benefix at home. He will bring a total of 8000 units with him to his procedure; two 4000 unit doses. He also is advised that should he need any biopsies or dilations procedures that may result in tissue trauma or bleeding, it is Dr Vida Rigger recommendation that he be treated with an additional dose of Benefix 4000 units on post op day #1. However, patient indicates that he would need to be set up to have a nurse administer this at home this is typically what Dr Vida Rigger office arranges for him if needed. Advised I would make Dr Rush Landmark aware.

## 2018-10-23 NOTE — Telephone Encounter (Signed)
Thank you for the update. Patty, I'm putting Benjamine Mola and Thayer on this notation since they would be the TLs at time of his procedure to ensure that the hospital and our RNs and Pharmacy are going to feel OK with the patient bringing their own medication or if pharmacy is going to have any issues? Please let me know if anything else I can do. Thanks. GM

## 2018-10-23 NOTE — Telephone Encounter (Signed)
Dr Vida Rigger office to arrange any follow up admin of Benefix.

## 2018-10-23 NOTE — Telephone Encounter (Signed)
Can we work with the patient or his primary care doctor to have a possible appointment for administration of factor the day after procedure in the very high likelihood that we will end up taking at least biopsies that he would need administration of factor the next day? Thank you and please keep me up-to-date. GM

## 2018-10-25 ENCOUNTER — Other Ambulatory Visit: Payer: Self-pay

## 2018-10-25 ENCOUNTER — Encounter (HOSPITAL_COMMUNITY): Payer: Self-pay | Admitting: *Deleted

## 2018-10-25 NOTE — Progress Notes (Signed)
[name] denies chest pain or shortness of breath. [name] has a  History of Hemophilia B, Patient's hematologist is Dr Lanell Persons at [facility]. [name] reports that he is to have  Factor administered prior to EGD and after if needed and he has the medication with him.  In epic Dr Lanell Persons gave clearance for procedure and sent plan of care to [facility].  I left a message on Endoscopy dept voice mail. Patient has a history of cirrhosis and hepatic endocephaly, he will take lactose after procedure.

## 2018-10-26 NOTE — Progress Notes (Signed)
Anesthesia Chart Review: SAME DAY WORK-UP (ENDO CASE)  Case:  [id number] Date/Time:  10/29/18 0845   Procedures:      ESOPHAGOGASTRODUODENOSCOPY (EGD) WITH PROPOFOL (N/A )     UPPER ENDOSCOPIC ULTRASOUND (EUS) RADIAL (N/A )   Anesthesia type:  Monitor Anesthesia Care   Pre-op diagnosis:  hepatitis c cirrhosis, history encephalopathy; hemophilia--- variceal screening   Location:  [facility] ROOM 1 / [name]   Surgeon:  [name], [name], MD      DISCUSSION: Patient is a 73 year old male scheduled for the above procedure.   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  - Hospitalized and [place] 08/17/18-08/20/18 for confusion secondary to hepatic encephalopathy.  He was treated with lactulose and symptoms improved; however, it was felt that this was not the only factor attributing to his altered mental status.  Components of dementia and depression thought to also be playing a role.  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 - Hospitalized at [facility] 07/13/18-07/15/18 for hepatic encephalopathy.   - Due to his history of Hemophilia B, Dr. Rush Hampton requested hematology recommendations. 10/18/18 telephone encounter by Justin Hampton, [place] provides forwarded hematology recommendations from Dr. Lanell Hampton: "We have received correspondence from Dr Justin Hampton with the following information:  '[name] (DOB 07/06/1946) has mild factor IX deficiency (hemophilia B) who is scheduled for an endoscopy by Dr Justin Hampton of [facility] on 10/29/2018. His current weight is 103 kg.  1. 30-60 minutes prior to incision, please administer 4000 units of Benefix (recombinant FIX) units (round to nearest available vial size) IV push  over 3-5 minutes in pre-operative holding room. He will be instructed to bring two 4000 unit does to the procedure and his home factor can be administered with the extra dose available for any unexpected major bleeding. 2. If any procedures are performed during the endoscopic procedure (such as biopsies or dilation procedures) that may result in some tissue trauma and bleeding, it is recommended that the treat on POD #1 with an additional dose of 4000 units of Benefix. He can perform this at home as he usually would for on-demand treatment of acute bleeding episodes. 3. Page Justin Hampton through the [place] if any questions or problems arise that would require input from a hematologist.  Justin Bur, MD Hematology and Oncology 10/15/2018 8:51 AM'"  - As of 10/25/2018 4:45 PM Justin Hilts, PA-C [facility] Hematology) wrote:  "Call made to [facility] to follow-up with plan of care for Mr Hampton planned endoscopy on 1/6. They advise they CANNOT give the pre-op dose. I will need to see if home health can go out to home and administer. His endoscopy is scheduled for 845 and he needs to be at office at 715/30. I prefer to limit the time between infusion and procedure to around 60 min if possible due to the peak factor levels being obtained ~50-60 mark, but given the long half life of factor IX a start time of closer to 2ish hours should be fine.   Placed call to patient to advise when he needs to leave his home to get to the GI clinic - will call [place] back to see if they can do that time. If not may need to push his time later  Also asked  about his doses available at home to ensure he has enough stocked factor."  - As of 10/26/18 4:39 PM, I had spoken with RN Justin Hampton in Endo and Justin Shape RN Justin Hampton and their understanding was that patient was to bring Benefix in with him and the endoscopy nursing staff would administer it. However, when I called and spoke with [name] he reported that he was just notified within the hour that a home health nurse was going to arrive at his house at 6:30 AM 10/29/18 to administer the Benefix and then he would head over to [facility] (scheduled arrival 7:15 AM). He reports that because Benefix is going to be given at home (making the dose ~ 2 hours prior to procedure) that he would be getting 6000 units (instead of 4000 units). I have updated RN Justin Hampton in Endoscopy. She will plan to update Dr. Rush Hampton. Case previously reviewed with anesthesiologist Justin Hollingshead, MD as well.   PROVIDERS: Justin Bill, MD is listed as PCP Justin Basil, MD is Hematologist. See [facility] Care Everywhere   LABS: Labs per GI on the day of procedure. As on 09/24/18, his most recent labs show: Lab Results  Component Value Date   WBC 5.0 09/24/2018   HGB 11.3 (L) 09/24/2018   HCT 33.6 (L) 09/24/2018   PLT 159.0 09/24/2018   GLUCOSE 107 (H) 09/24/2018   ALT 15 09/24/2018   AST 21 09/24/2018   NA 139 09/24/2018   K 4.3 09/24/2018   CL 109 09/24/2018   CREATININE 1.19 09/24/2018   BUN 18 09/24/2018   CO2 24 09/24/2018   TSH 1.11 09/24/2018   INR 1.2 (H) 09/24/2018     IMAGES: [place] ABD 10/02/18: IMPRESSION: - Findings consistent with hepatic cirrhosis. No focal sonographic abnormality is seen within the liver. - Pancreatic duct is mildly dilated at 4 mm. Follow-up ultrasound in 1 year is recommended to ensure stability or resolution. - No other significant abnormality seen in the abdomen.  MRA brain/neck 08/10/18 ([place]): 1. 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 2. Patulous appearance of the basilar tip without discrete focal outpouching.  CXR 07/13/18: IMPRESSION: No active cardiopulmonary disease.  CT Chest 03/23/18 [facility] Care Everywhere): RESULT IMPRESSION: - Upper lobe predominant  groundglass centrilobular micronodularity with associated mild bronchial wall thickening. These findings may relate to an ongoing airway predominant infectious/inflammatory condition. Consider atypical infectious etiologies particularly if the patient is immunocompromised. Recommend follow-up CT of the thorax in 4-6 weeks to confirm resolution. - Circumferential esophageal wall thickening and small volume air and fluid noted throughout the esophagus. There is a 1.0 cm mucosal nodule suggested in the mid esophagus at the level of the carina. This is difficult to separate from mucosal fluid and can be further evaluated utilizing dedicated endoscopy. - Nonspecific 4 mm right lower lobe pulmonary nodule which can be further evaluated on follow-up imaging. 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 These are incompletely evaluated on this CT of the chest. 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 - Mild coronary arterial calcifications.   EKG: 07/14/18: NSR   CV: Exercise stress echo 02/16/17 ([place]): SUMMARY The patient had no chest pain. The patient achieved 102 % of maximum predicted heart  rate. The METS achieved was 4.9. Exercise capacity was poor. The baseline ECG displays normal sinus rhythm. Normal left ventricular function at rest. The estimated LV ejection fraction is 55-60%. There were no segmental wall motion abnormalities post exercise. There was normal increase in global LV function post exercise. Negative stress ECG for inducible ischemia at target heart rate. Negative exercise echocardiography for inducible ischemia at target heart  rate.  Echo 04/25/16 ([place]): SUMMARY The left ventricular size is normal. The left ventricular wall motion is normal. The right  ventricle is normal in size and function. The left atrial size is normal. Right atrial size is normal. There is trace tricuspid regurgitation. Estimated right ventricular systolic pressure is 34 mmHg. There is no pericardial effusion. Somewhat similar observations on the prior study from 08/13/09.   Past Medical History:  Diagnosis Date  . Adenomatous colon polyp   . Arthritis    fingers  . 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 Cirrhosis ([place])   . Diverticulosis   . GERD (gastroesophageal reflux disease)   . 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[place])    Hemophilia B  . Hepatic encephalopathy ([place]) 04/12/2012  . Hepatitis C    treated  . Hypoglycemia   . Hypothyroidism     Past Surgical History:  Procedure Laterality Date  . APPENDECTOMY    . CARPAL TUNNEL RELEASE Bilateral   . COLONOSCOPY    . ESOPHAGOGASTRODUODENOSCOPY     x 2    MEDICATIONS: No current facility-administered medications for this encounter.    Marland Kitchen acetaminophen (TYLENOL) 500 MG tablet  . albuterol (PROVENTIL HFA;VENTOLIN HFA) 108 (90 Base) MCG/ACT inhaler  . lactulose (CHRONULAC) 10 GM/15ML solution  . levothyroxine (SYNTHROID, LEVOTHROID) 50 MCG tablet  . magnesium oxide (MAG-OX) 400 (241.3 Mg) MG tablet  . pantoprazole (PROTONIX) 40 MG tablet  . spironolactone (ALDACTONE) 25 MG tablet  . coagulation factor IX, recomb, (BENEFIX) 1000 units injection  . Emollient (AQUAPHOR EX)    Myra Gianotti, PA-C Surgical Short Stay/Anesthesiology [facility] Phone [phone] [facility] Phone [phone] 10/26/2018 4:46 PM

## 2018-10-26 NOTE — Anesthesia Preprocedure Evaluation (Addendum)
Anesthesia Evaluation  Patient identified by MRN, date of birth, ID band  Reviewed: Allergy & Precautions, NPO status , Patient's Chart, lab work & pertinent test results  History of Anesthesia Complications Negative for: history of anesthetic complications  Airway Mallampati: II  TM Distance: >3 FB Neck ROM: Full    Dental  (+) Teeth Intact, Dental Advisory Given   Pulmonary former smoker,    Pulmonary exam normal breath sounds clear to auscultation       Cardiovascular Normal cardiovascular exam Rhythm:Regular Rate:Normal  Normal TTE 2017   Neuro/Psych Dementia negative neurological ROS     GI/Hepatic GERD  Controlled and Medicated,(+) Hepatitis -, C  Endo/Other  Hypothyroidism   Renal/GU Renal InsufficiencyRenal disease     Musculoskeletal  (+) Arthritis ,   Abdominal   Peds  Hematology  (+) Blood dyscrasia (hemophilia B), ,   Anesthesia Other Findings Day of surgery medications reviewed with the patient.  Reproductive/Obstetrics                          Anesthesia Physical Anesthesia Plan  ASA: III  Anesthesia Plan: MAC   Post-op Pain Management:    Induction:   PONV Risk Score and Plan: 1 and Treatment may vary due to age or medical condition and Propofol infusion  Airway Management Planned: Natural Airway and Nasal Cannula  Additional Equipment:   Intra-op Plan:   Post-operative Plan:   Informed Consent: I have reviewed the patients History and Physical, chart, labs and discussed the procedure including the risks, benefits and alternatives for the proposed anesthesia with the patient or authorized representative who has indicated his/her understanding and acceptance.   Dental advisory given  Plan Discussed with: CRNA  Anesthesia Plan Comments: (See PAT note written 10/26/2018 by Myra Gianotti, PA-C. He has hemophilia B. PAT note outlines hematology instructions. Last  account was that a HHRN was going to administer BeneFIX prior to patient arriving at [facility].  )      Anesthesia Quick Evaluation

## 2018-10-29 ENCOUNTER — Ambulatory Visit (HOSPITAL_COMMUNITY): Payer: Medicare Other | Admitting: Vascular Surgery

## 2018-10-29 ENCOUNTER — Encounter (HOSPITAL_COMMUNITY)
Admission: RE | Disposition: A | Discharge status: Home / Self Care | Payer: Self-pay | Source: Home / Self Care | Attending: Gastroenterology

## 2018-10-29 ENCOUNTER — Encounter (HOSPITAL_COMMUNITY): Payer: Self-pay | Admitting: *Deleted

## 2018-10-29 ENCOUNTER — Ambulatory Visit (HOSPITAL_COMMUNITY)
Admission: RE | Admit: 2018-10-29 | Discharge: 2018-10-29 | Disposition: A | Discharge status: Home / Self Care | Payer: Medicare Other | Attending: Gastroenterology | Admitting: Gastroenterology

## 2018-10-29 ENCOUNTER — Telehealth: Payer: Self-pay | Admitting: Gastroenterology

## 2018-10-29 ENCOUNTER — Other Ambulatory Visit: Payer: Self-pay

## 2018-10-29 DIAGNOSIS — K766 Portal hypertension: Secondary | ICD-10-CM | POA: Diagnosis not present

## 2018-10-29 DIAGNOSIS — K219 Gastro-esophageal reflux disease without esophagitis: Secondary | ICD-10-CM | POA: Insufficient documentation

## 2018-10-29 DIAGNOSIS — B182 Chronic viral hepatitis C: Secondary | ICD-10-CM

## 2018-10-29 DIAGNOSIS — K7460 Unspecified cirrhosis of liver: Secondary | ICD-10-CM | POA: Insufficient documentation

## 2018-10-29 DIAGNOSIS — E039 Hypothyroidism, unspecified: Secondary | ICD-10-CM | POA: Insufficient documentation

## 2018-10-29 DIAGNOSIS — K3189 Other diseases of stomach and duodenum: Secondary | ICD-10-CM | POA: Insufficient documentation

## 2018-10-29 DIAGNOSIS — Z79899 Other long term (current) drug therapy: Secondary | ICD-10-CM | POA: Insufficient documentation

## 2018-10-29 DIAGNOSIS — Z8669 Personal history of other diseases of the nervous system and sense organs: Secondary | ICD-10-CM

## 2018-10-29 DIAGNOSIS — M1990 Unspecified osteoarthritis, unspecified site: Secondary | ICD-10-CM | POA: Insufficient documentation

## 2018-10-29 DIAGNOSIS — K869 Disease of pancreas, unspecified: Secondary | ICD-10-CM | POA: Insufficient documentation

## 2018-10-29 DIAGNOSIS — B1920 Unspecified viral hepatitis C without hepatic coma: Secondary | ICD-10-CM | POA: Diagnosis not present

## 2018-10-29 DIAGNOSIS — D67 Hereditary factor IX deficiency: Secondary | ICD-10-CM | POA: Insufficient documentation

## 2018-10-29 DIAGNOSIS — Z87891 Personal history of nicotine dependence: Secondary | ICD-10-CM | POA: Insufficient documentation

## 2018-10-29 HISTORY — DX: Unspecified osteoarthritis, unspecified site: M19.90

## 2018-10-29 HISTORY — PX: ESOPHAGOGASTRODUODENOSCOPY (EGD) WITH PROPOFOL: SHX5813

## 2018-10-29 HISTORY — PX: EUS: SHX5427

## 2018-10-29 SURGERY — ESOPHAGOGASTRODUODENOSCOPY (EGD) WITH PROPOFOL
Anesthesia: Monitor Anesthesia Care

## 2018-10-29 MED ORDER — PROPOFOL 500 MG/50ML IV EMUL
INTRAVENOUS | Status: DC | PRN
  Administered 2018-10-29: 10:00:00 via INTRAVENOUS
  Administered 2018-10-29: 100 ug/kg/min via INTRAVENOUS

## 2018-10-29 MED ORDER — LACTATED RINGERS IV SOLN
INTRAVENOUS | Status: DC | PRN
  Administered 2018-10-29: 08:00:00 via INTRAVENOUS

## 2018-10-29 MED ORDER — SODIUM CHLORIDE 0.9 % IV SOLN: INTRAVENOUS | Status: DC

## 2018-10-29 SURGICAL SUPPLY — 15 items

## 2018-10-29 NOTE — Progress Notes (Signed)
Patient received Benefix 2000 units this am prior to being admitted to Endoscopy. Patient brought additional dose with him if needed post procedure. Procedural MD made aware.

## 2018-10-29 NOTE — Anesthesia Postprocedure Evaluation (Signed)
Anesthesia Post Note  Patient: Justin Hampton  Procedure(s) Performed: ESOPHAGOGASTRODUODENOSCOPY (EGD) WITH PROPOFOL (N/A ) UPPER ENDOSCOPIC ULTRASOUND (EUS) RADIAL (N/A )     Patient location during evaluation: PACU Anesthesia Type: MAC Level of consciousness: awake and alert Pain management: pain level controlled Vital Signs Assessment: post-procedure vital signs reviewed and stable Respiratory status: spontaneous breathing, nonlabored ventilation and respiratory function stable Cardiovascular status: blood pressure returned to baseline and stable Postop Assessment: no apparent nausea or vomiting Anesthetic complications: no    Last Vitals:  Vitals:   10/29/18 0755 10/29/18 1010  BP: 123/75 128/64  Pulse: 74 70  Resp: 17 16  Temp: 36.7 C 36.4 C  SpO2: 100% 98%    Last Pain:  Vitals:   10/29/18 1010  TempSrc: Oral  PainSc: 0-No pain                 Brennan Bailey

## 2018-10-29 NOTE — H&P (Signed)
GASTROENTEROLOGY OUTPATIENT PROCEDURE H&P NOTE   Primary Care Physician: Bartholome Bill, MD  HPI: Justin Hampton is a 73 y.o. male who presents for EGD/EUS.  Past Medical History:  Diagnosis Date  . Adenomatous colon polyp   . Arthritis    fingers  . 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 Cirrhosis ([place])   . Diverticulosis   . GERD (gastroesophageal reflux disease)   . 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[place])    Hemophilia B  . Hepatic encephalopathy ([place]) 04/12/2012  . Hepatitis C    treated  . Hypoglycemia   . Hypothyroidism    Past Surgical History:  Procedure Laterality Date  . APPENDECTOMY    . CARPAL TUNNEL RELEASE Bilateral   . COLONOSCOPY    . ESOPHAGOGASTRODUODENOSCOPY     x 2   Current Facility-Administered Medications  Medication Dose Route Frequency Provider Last Rate Last Dose  . 0.9 %  sodium chloride infusion   Intravenous Continuous [name], [name], MD       Allergies  Allergen Reactions  . Morphine Other (See Comments)    Caused confusion/During appendectomy  . Zolpidem Other (See Comments)    Confusion    Family History  Problem Relation Age of Onset  . Dementia Mother 46  . Heart failure Mother   . Osteoarthritis Mother   . Hypertension Mother   . Hypertension Father   . Heart attack Father   . Breast cancer Sister   . Uterine cancer Sister   . Hypertension Sister   . Hemophilia Brother   . Heart disease Brother   . Hypertension Brother   . Hypothyroidism Sister   . Osteoarthritis Sister   . Heart failure Maternal Grandmother   . Stroke Maternal Grandmother   . Hypertension Maternal Grandmother   . Heart failure Maternal Grandfather   . Diabetes Paternal Grandfather   . Colon cancer Neg Hx   . Esophageal cancer Neg Hx   . Inflammatory bowel disease Neg Hx   . Liver disease Neg Hx   . Pancreatic cancer Neg Hx   . Rectal cancer Neg Hx   . Stomach cancer Neg Hx    Social History   Socioeconomic History  .  Marital status: Married    Spouse name: Not on file  . Number of children: 2  . Years of education: Not on file  . Highest education level: Not on file  Occupational History  . Occupation: retired  Scientific laboratory technician  . Financial resource strain: Not on file  . Food insecurity:    Worry: Not on file    Inability: Not on file  . Transportation needs:    Medical: Not on file    Non-medical: Not on file  Tobacco Use  . Smoking status: Former Smoker    Years: 20.00    Last attempt to quit: 08/1995    Years since quitting: 23.1  . Smokeless tobacco: Never Used  Substance and Sexual Activity  . Alcohol use: Not Currently  . Drug use: No  . Sexual activity: Not on file  Lifestyle  . Physical activity:    Days per week: Not on file    Minutes per session: Not on file  . Stress: Not on file  Relationships  . Social connections:    Talks on phone: Not on file    Gets together: Not on file    Attends religious service: Not on file    Active member of club or  organization: Not on file    Attends meetings of clubs or organizations: Not on file    Relationship status: Not on file  . Intimate partner violence:    Fear of current or ex partner: Not on file    Emotionally abused: Not on file    Physically abused: Not on file    Forced sexual activity: Not on file  Other Topics Concern  . Not on file  Social History Narrative  . Not on file    Physical Exam: Vital signs in last 24 hours: Temp:  [98 F (36.7 C)] 98 F (36.7 C) (01/06 0755) Pulse Rate:  [74] 74 (01/06 0755) Resp:  [17] 17 (01/06 0755) BP: (123)/(75) 123/75 (01/06 0755) SpO2:  [100 %] 100 % (01/06 0755) Weight:  [99.8 kg] 99.8 kg (01/06 0755)   GEN: NAD EYE: Sclerae anicteric ENT: MMM CV: RR without R/Gs  RESP: CTAB posteriorly GI: Soft, NT/ND NEURO:  Alert & Oriented x 3  Lab Results: No results for input(s): WBC, HGB, HCT, PLT in the last 72 hours. BMET No results for input(s): NA, K, CL, CO2, GLUCOSE,  BUN, CREATININE, CALCIUM in the last 72 hours. LFT No results for input(s): PROT, ALBUMIN, AST, ALT, ALKPHOS, BILITOT, BILIDIR, IBILI in the last 72 hours. PT/INR No results for input(s): LABPROT, INR in the last 72 hours.   Impression / Plan: This is a 73 y.o.male who presents for EGD/EUS.  The risks of EUS including bleeding, infection, aspiration pneumonia and intestinal perforation were discussed as was the possibility it may not give a definitive diagnosis.  If a biopsy of the pancreas is done as part of the EUS, there is an additional risk of pancreatitis at the rate of about 1%.  It was explained that procedure related pancreatitis is typically mild, although can be severe and even life threatening, which is why we do not perform random pancreatic biopsies and only biopsy a lesion we feel is concerning enough to warrant the risk.  The risks and benefits of endoscopic evaluation were discussed with the patient; these include but are not limited to the risk of perforation, infection, bleeding, missed lesions, lack of diagnosis, severe illness requiring hospitalization, as well as anesthesia and sedation related illnesses.  The patient is agreeable to proceed.    Justice Britain, MD [facility] Advanced Endoscopy Office # [phone]

## 2018-10-29 NOTE — Telephone Encounter (Signed)
I spoke with Colletta Maryland and faxed her the EUS report from today to [phone]

## 2018-10-29 NOTE — Discharge Instructions (Signed)
YOU HAD AN ENDOSCOPIC PROCEDURE TODAY: Refer to the procedure report and other information in the discharge instructions given to you for any specific questions about what was found during the examination. If this information does not answer your questions, please call [place] office at [phone] to clarify.   YOU SHOULD EXPECT: Some feelings of bloating in the abdomen. Passage of more gas than usual. Walking can help get rid of the air that was put into your GI tract during the procedure and reduce the bloating. If you had a lower endoscopy (such as a colonoscopy or flexible sigmoidoscopy) you may notice spotting of blood in your stool or on the toilet paper. Some abdominal soreness may be present for a day or two, also.  DIET: Your first meal following the procedure should be a light meal and then it is ok to progress to your normal diet. A half-sandwich or bowl of soup is an example of a good first meal. Heavy or fried foods are harder to digest and may make you feel nauseous or bloated. Drink plenty of fluids but you should avoid alcoholic beverages for 24 hours. If you had a esophageal dilation, please see attached instructions for diet.    ACTIVITY: Your care partner should take you home directly after the procedure. You should plan to take it easy, moving slowly for the rest of the day. You can resume normal activity the day after the procedure however YOU SHOULD NOT DRIVE, use power tools, machinery or perform tasks that involve climbing or major physical exertion for 24 hours (because of the sedation medicines used during the test).   SYMPTOMS TO REPORT IMMEDIATELY: A gastroenterologist can be reached at any hour. Please call [phone]  for any of the following symptoms:   Following upper endoscopy (EGD, EUS, ERCP, esophageal dilation) Vomiting of blood or coffee ground material  New, significant abdominal pain  New, significant chest pain or pain under the shoulder blades  Painful or  persistently difficult swallowing  New shortness of breath  Black, tarry-looking or red, bloody stools  FOLLOW UP:  If any biopsies were taken you will be contacted by phone or by letter within the next 1-3 weeks. Call [phone]  if you have not heard about the biopsies in 3 weeks.  Please also call with any specific questions about appointments or follow up tests.  

## 2018-10-29 NOTE — Anesthesia Procedure Notes (Signed)
Procedure Name: MAC Date/Time: 10/29/2018 9:05 AM Performed by: Harden Mo, CRNA Pre-anesthesia Checklist: Patient identified, Emergency Drugs available, Suction available and Patient being monitored Patient Re-evaluated:Patient Re-evaluated prior to induction Oxygen Delivery Method: Nasal cannula Preoxygenation: Pre-oxygenation with 100% oxygen Induction Type: IV induction Placement Confirmation: positive ETCO2 and breath sounds checked- equal and bilateral Dental Injury: Teeth and Oropharynx as per pre-operative assessment

## 2018-10-29 NOTE — Transfer of Care (Signed)
Immediate Anesthesia Transfer of Care Note  Patient: Justin Hampton  Procedure(s) Performed: ESOPHAGOGASTRODUODENOSCOPY (EGD) WITH PROPOFOL (N/A ) UPPER ENDOSCOPIC ULTRASOUND (EUS) RADIAL (N/A )  Patient Location: Endoscopy Unit  Anesthesia Type:MAC  Level of Consciousness: awake  Airway & Oxygen Therapy: Patient Spontanous Breathing  Post-op Assessment: Report given to RN, Post -op Vital signs reviewed and stable and Patient moving all extremities X 4  Post vital signs: Reviewed and stable  Last Vitals:  Vitals Value Taken Time  BP    Temp    Pulse 73 10/29/2018 10:10 AM  Resp 16 10/29/2018 10:10 AM  SpO2 99 % 10/29/2018 10:10 AM  Vitals shown include unvalidated device data.  Last Pain:  Vitals:   10/29/18 0755  TempSrc: Oral  PainSc: 0-No pain         Complications: No apparent anesthesia complications

## 2018-10-29 NOTE — Op Note (Signed)
[facility] Patient Name: Justin Hampton Procedure Date : 10/29/2018 MRN: [id number] Attending MD: Justice Britain , MD Date of Birth: 28-Jun-1946 CSN: [id number] Age: 73 Admit Type: Inpatient Procedure:                Upper EUS Indications:              Abnormal ultrasound of the abdomen, Cirrhosis rule                            out esophageal varices Providers:                Justice Britain, MD Referring MD:              Medicines:                Monitored Anesthesia Care Complications:            No immediate complications. Estimated Blood Loss:     Estimated blood loss: none. Procedure:                Pre-Anesthesia Assessment:                           - Prior to the procedure, a History and Physical                            was performed, and patient medications and                            allergies were reviewed. The patient's tolerance of                            previous anesthesia was also reviewed. The risks                            and benefits of the procedure and the sedation                            options and risks were discussed with the patient.                            All questions were answered, and informed consent                            was obtained. Prior Anticoagulants: The patient has                            taken no previous anticoagulant or antiplatelet                            agents. ASA Grade Assessment: III - A patient with                            severe systemic disease. After reviewing the risks  and benefits, the patient was deemed in                            satisfactory condition to undergo the procedure.                           After obtaining informed consent, the endoscope was                            passed under direct vision. Throughout the                            procedure, the patient's blood pressure, pulse, and                            oxygen saturations  were monitored continuously. The                            [id number]-H190 ([id number]) Olympus gastroscope was                            introduced through the mouth, and advanced to the                            second part of duodenum. The [id number]-[id number] ([id number])                            Olympus duodenoscope was introduced through the                            mouth, and advanced to the second part of duodenum.                            The [id number] ([id number]) Olympus Linear EUS scope                            was introduced through the mouth, and advanced to                            the duodenum for ultrasound examination from the                            stomach and duodenum. The upper EUS was                            accomplished without difficulty. The patient                            tolerated the procedure. Findings:      ENDOSCOPIC FINDING: :      No gross lesions were noted in the proximal esophagus and in the mid       esophagus.      Grade I, small (< 5 mm) varices were found in the distal esophagus.  The Z-line was regular and was found 40 cm from the incisors.      Moderate portal hypertensive gastropathy was found in the entire       examined stomach.      There is no endoscopic evidence of ulceration or varices in the entire       examined stomach.      No gross lesions were noted in the duodenal bulb, in the first portion       of the duodenum and in the second portion of the duodenum.      A deformity was found in the area of the papilla - query previous       sphincterotomy?      ENDOSONOGRAPHIC FINDING: :      Pancreatic parenchymal abnormalities were noted in the entire pancreas.       These consisted of hyperechoic foci with shadowing, lobularity with       honeycombing and hyperechoic strands.      The pancreatic duct had a dilated endosonographic appearance in the       pancreatic head (4.0 mm -> 5.1 mm), genu of the pancreas (3.6 mm -> 3.8       mm).  More normal caliber pancreatic duct in the body of the pancreas       (1.6 mm) and tail of the pancreas (1.4 mm).      Endosonographic imaging in the pancreatic head and pancreatic neck       showed no mass.      There was no sign of significant endosonographic abnormality in the       common bile duct (2.9 -> 4.8 mm). No stones and ducts with regular       contour were identified.      Endosonographic imaging of the ampulla showed no mass.      A cyst was found in the visualized portion of the liver and measured 7       mm by 10 mm in maximal cross-sectional diameter. The cyst was anechoic.       The outer wall of the lesion was thin.      Otherwise, endosonographic imaging in the visualized portion of the rest       of the liver showed no mass-lesion.      No malignant-appearing lymph nodes were visualized in the celiac region       (level 20), perigastric region and peripancreatic region.      There was abnormal vascular flow observed in the region of the splenic       vein, based on endosonographic examination this is concerning for       possible splenic varices.      The celiac region was visualized. Impression:               EGD Impression:                           - No gross lesions in proximal/middle esophagus.                            Grade I and small (< 5 mm) esophageal varices in                            distal esophagus that flatten completely. Z-line  regular, 40 cm from the incisors.                           - Portal hypertensive gastropathy.                           - No gross lesions in the duodenal bulb, in the                            first portion of the duodenum and in the second                            portion of the duodenum.                           - Duodenal deformity at the ampulla - query                            previous sphincterotomy?                           EUS Impression:                           - Pancreatic  parenchymal abnormalities consisting                            of hyperechoic foci, lobularity with honeycombing                            and hyperechoic strands were noted in the entire                            pancreas.                           - The pancreatic duct had a dilated endosonographic                            appearance in the pancreatic head, genu of the                            pancreas. The pancreatic duct was normal caliber in                            the body of the pancreas and tail of the pancreas.                           - Based on [place] Criteria, the patient has both                            a Major A and Major B criteria as well as a Minor  criteria. He would endoscopic ultrasound defined as                            consistent with chronic pancreatitis.                           - Query possibility of sanctorinocele vs dilated                            dorsal duct vs Pancreatic divisum.                           - No overt mass/lesion was seen within the pancreas.                           - There was no sign of significant pathology in the                            common bile duct.                           - A cyst was found in the visualized portion of the                            liver and measured 7 mm by 10 mm. No other                            lesions/masses were noted.                           - No malignant-appearing lymph nodes were                            visualized in the celiac region (level 20),                            perigastric region and peripancreatic region.                           - Endosonographically, there was abnormal vascular                            flow in the region of splenic vein - query splenic                            varices. Recommendation:           - The patient will be observed post-procedure,                            until all discharge criteria are met.                            - Discharge patient to home.                           -  Will plan a MRI/MRCP to further evaluate the                            pancreatic duct with concern for Sanctorinocele vs                            Pancreatic Divisum but also understand concern for                            possible other varcies within the intra-abdominal                            cavity and ensure no other mass/lesion noted in the                            uncinate region that was not able to be visualized                            due to tight turn in the D1/D2 angle.                           - Will discuss with patient and sister at upcoming                            visit inititation of a non-selective beta blocker                            for optimization of HRs and BP in efforts of trying                            to decrease risk of bleeding from small G1 EVs.                           - Will discuss at upcoming visit implications of                            Chronic Pancreatitis.                           - The findings and recommendations were discussed                            with the patient.                           - The findings and recommendations were discussed                            with the patient's family. Procedure Code(s):        --- Professional ---                           [phone], Esophagogastroduodenoscopy, flexible,  transoral; with endoscopic ultrasound examination                            limited to the esophagus, stomach or duodenum, and                            adjacent structures Diagnosis Code(s):        --- Professional ---                           K74.60, Unspecified cirrhosis of liver                           I85.10, Secondary esophageal varices without                            bleeding                           K76.6, Portal hypertension                           K31.89, Other diseases of stomach and  duodenum                           K86.9, Disease of pancreas, unspecified                           K76.89, Other specified diseases of liver                           I89.9, Noninfective disorder of lymphatic vessels                            and lymph nodes, unspecified                           R93.89, Abnormal findings on diagnostic imaging of                            other specified body structures                           R93.3, Abnormal findings on diagnostic imaging of                            other parts of digestive tract                           R93.5, Abnormal findings on diagnostic imaging of                            other abdominal regions, including retroperitoneum CPT copyright 2018 [employer]. All rights reserved. The codes documented in this report are preliminary and upon coder review may  be revised to meet current compliance requirements. Justice Britain, MD 10/29/2018 10:32:43 AM Number of Addenda: 0

## 2018-10-29 NOTE — Telephone Encounter (Signed)
Stephaine from [facility]  [phone]  Called and wanted an update on the mutual pt she also lft number [phone]

## 2018-10-31 ENCOUNTER — Encounter (HOSPITAL_COMMUNITY): Payer: Self-pay | Admitting: Gastroenterology

## 2018-10-31 DIAGNOSIS — Z7409 Other reduced mobility: Secondary | ICD-10-CM | POA: Insufficient documentation

## 2018-10-31 DIAGNOSIS — E162 Hypoglycemia, unspecified: Secondary | ICD-10-CM | POA: Insufficient documentation

## 2018-11-01 ENCOUNTER — Encounter: Payer: Self-pay | Admitting: Podiatry

## 2018-11-01 ENCOUNTER — Ambulatory Visit (INDEPENDENT_AMBULATORY_CARE_PROVIDER_SITE_OTHER): Payer: Medicare Other | Admitting: Podiatry

## 2018-11-01 VITALS — BP 124/82 | HR 83

## 2018-11-01 DIAGNOSIS — M79609 Pain in unspecified limb: Secondary | ICD-10-CM | POA: Diagnosis not present

## 2018-11-01 DIAGNOSIS — L603 Nail dystrophy: Secondary | ICD-10-CM | POA: Diagnosis not present

## 2018-11-01 DIAGNOSIS — B351 Tinea unguium: Secondary | ICD-10-CM

## 2018-11-01 DIAGNOSIS — L608 Other nail disorders: Secondary | ICD-10-CM | POA: Diagnosis not present

## 2018-11-04 NOTE — Progress Notes (Signed)
Subjective:  Patient ID: Justin Hampton, male    DOB: [birth year],  MRN: [id number]  Chief Complaint  Patient presents with  . Nail Problem    left great toenail thick and discolored - been like this for months. No injury. Starting to "get loose"    73 y.o. male presents with the above complaint. History as above.  Review of Systems: Negative except as noted in the HPI. Denies N/V/F/Ch.  Past Medical History:  Diagnosis Date  . Adenomatous colon polyp   . Arthritis    fingers  . 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 Diverticulosis   . GERD (gastroesophageal reflux disease)   . 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 Hepatic encephalopathy ([place]) 04/12/2012  . Hepatitis C    treated  . Hypoglycemia   . Hypothyroidism     Current Outpatient Medications:  .  acetaminophen (TYLENOL) 500 MG tablet, Take 500 mg by mouth every 6 (six) hours as needed for mild pain., Disp: , Rfl:  .  albuterol (PROVENTIL HFA;VENTOLIN HFA) 108 (90 Base) MCG/ACT inhaler, Inhale 1-2 puffs into the lungs every 6 (six) hours as needed for wheezing or shortness of breath., Disp: , Rfl:  .  coagulation factor IX, recomb, (BENEFIX) 1000 units injection, Inject 4,000 Units into the vein See admin instructions. Whenever having surgery  As directed by hematologist, Disp: , Rfl:  .  Emollient (AQUAPHOR EX), Apply 1 application topically 2 (two) times daily as needed (for dry skin). , Disp: , Rfl:  .  lactulose (CHRONULAC) 10 GM/15ML solution, Take 45 mLs (30 g total) by mouth 3 (three) times daily., Disp: 4050 mL, Rfl: 0 .  levothyroxine (SYNTHROID, LEVOTHROID) 50 MCG tablet, Take 50 mcg by mouth daily., Disp: , Rfl:  .  magnesium oxide (MAG-OX) 400 (241.3 Mg) MG tablet, Take 400 mg by mouth daily., Disp: , Rfl:  .  pantoprazole (PROTONIX) 40 MG tablet, Take 40 mg by mouth daily., Disp: , Rfl: 2 .  spironolactone (ALDACTONE) 25 MG tablet, Take 25 mg by mouth daily., Disp:  , Rfl:   Social History   Tobacco Use  Smoking Status Former Smoker  . Years: 20.00  . Last attempt to quit: 08/1995  . Years since quitting: 23.2  Smokeless Tobacco Never Used    Allergies  Allergen Reactions  . Morphine Other (See Comments)    Caused confusion/During appendectomy  . Zolpidem Other (See Comments)    Confusion    Objective:   Vitals:   11/01/18 1050  BP: 124/82  Pulse: 83   There is no height or weight on file to calculate BMI. Constitutional Well developed. Well nourished.  Vascular Dorsalis pedis pulses palpable bilaterally. Posterior tibial pulses palpable bilaterally. Capillary refill normal to all digits.  No cyanosis or clubbing noted. Pedal hair growth normal.  Neurologic Normal speech. Oriented to person, place, and time. Epicritic sensation to light touch grossly present bilaterally.  Dermatologic Nails left hallux thickened subungual hemorrhage Skin intact  Orthopedic: Normal joint ROM without pain or crepitus bilaterally. No visible deformities. No bony tenderness.   Radiographs: None Assessment:   1. Nail dystrophy   2. Pain due to onychomycosis of nail   3. Subungual hemorrhage    Plan:  Patient was evaluated and treated and all questions answered.  Subungual Hematoma, Onychodystrophy -Nail debrided -Discussed the issue will likely resolve with time. -Discussed holding off full avulsion at this time.  Adhered proximally. -F/u PRN.  No follow-ups on file.

## 2018-11-06 ENCOUNTER — Encounter: Payer: Self-pay | Admitting: Neurology

## 2018-11-06 ENCOUNTER — Encounter

## 2018-11-06 ENCOUNTER — Other Ambulatory Visit: Payer: Self-pay

## 2018-11-06 ENCOUNTER — Ambulatory Visit (INDEPENDENT_AMBULATORY_CARE_PROVIDER_SITE_OTHER): Payer: Medicare Other | Admitting: Neurology

## 2018-11-06 VITALS — BP 124/82 | HR 86 | Ht 69.0 in | Wt 223.0 lb

## 2018-11-06 DIAGNOSIS — K7682 Hepatic encephalopathy: Secondary | ICD-10-CM

## 2018-11-06 DIAGNOSIS — K7290 Hepatic failure, unspecified without coma: Secondary | ICD-10-CM | POA: Diagnosis not present

## 2018-11-06 DIAGNOSIS — R413 Other amnesia: Secondary | ICD-10-CM

## 2018-11-06 NOTE — Patient Instructions (Signed)
Looking good! If symptoms change and more concerns about memory are raised, please call our office. Follow-up as needed.   RECOMMENDATIONS FOR ALL PATIENTS WITH MEMORY PROBLEMS: 1. Continue to exercise (Recommend 30 minutes of walking everyday, or 3 hours every week) 2. Increase social interactions - continue going to [place] and enjoy social gatherings with friends and family 3. Eat healthy, avoid fried foods and eat more fruits and vegetables 4. Maintain adequate blood pressure, blood sugar, and blood cholesterol level. Reducing the risk of stroke and cardiovascular disease also helps promoting better memory. 5. Avoid stressful situations. Live a simple life and avoid aggravations. Organize your time and prepare for the next day in anticipation. 6. Sleep well, avoid any interruptions of sleep and avoid any distractions in the bedroom that may interfere with adequate sleep quality 7. Avoid sugar, avoid sweets as there is a strong link between excessive sugar intake, diabetes, and cognitive impairment The Mediterranean diet has been shown to help patients reduce the risk of progressive memory disorders and reduces cardiovascular risk. This includes eating fish, eat fruits and green leafy vegetables, nuts like almonds and hazelnuts, walnuts, and also use olive oil. Avoid fast foods and fried foods as much as possible. Avoid sweets and sugar as sugar use has been linked to worsening of memory function.

## 2018-11-06 NOTE — Progress Notes (Signed)
NEUROLOGY CONSULTATION NOTE  Justin Hampton MRN: [id number] DOB: January 04, 1946  Referring provider: Dr. Arizona Constable Primary care provider: Dr. Precious Haws  Reason for consult:  Vascular dementia and tremor  Dear Dr Sandi Carne:  Thank you for your kind referral of Justin Hampton for consultation of the above symptoms. Although his history is well known to you, please allow me to reiterate it for the purpose of our medical record. He is alone in the office today. Records and images were personally reviewed where available.  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 He is alone in the office today, there is no family to corroborate the history. He lives alone and feels he remembers most things. He denies misplacing things, he denies getting lost driving. He denies missing bill payments or his medications. He states he is taking his Lactulose TID religiously and has not had any further confusional episodes. During his hospitalization in October 2019 for altered mental status, he was noted to have an elevated ammonia of 137. He has had prior admissions in August 2019 and September 2019 for hepatic encephalopathy. He was treated with lactulose and it was noted that mental status improved very quickly, and that it is unlikely this was the only factor causing his altered mental status. Components of dementia and depression were also felt to be contributing, his wife has advanced dementia in a nursing home. It was recommended that patient and family have ongoing conversations regarding long term mental status and was referred for vascular dementia. I personally reviewed head CT without contrast done 07/2018 which did not show any acute changes, there was mild generalized atrophy with mild ventricular and sulcal  enlargement, mild to moderate chronic microvascular disease.   He denies any headaches, dizziness, diplopia, dysarthria/dysphagia, back pain, focal numbness/tingling/weakness, bowel/bladder dysfunction, anosmia. Sleep is good. He reports his mood is pretty good, he is dealing with getting his wife situated in [place]. He has neck pain with pain on turning head side to side. "They say I have tremors," he has not noticed any difficulties using a fork. He noticed changes in his handwriting while he had the hepatic encephalopathy, this is getting better. His mother had dementia in her 82s. He denies any history of significant head injuries. He occasionally drinks a glass of wine.   Laboratory Data: Lab Results  Component Value Date   TSH 1.11 09/24/2018   Lab Results  Component Value Date   [id number] 07/13/2018     PAST MEDICAL HISTORY: Past Medical History:  Diagnosis Date  . Adenomatous colon polyp   . Arthritis    fingers  . 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 Cirrhosis ([place])   . Diverticulosis   . GERD (gastroesophageal reflux disease)   . 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 Hepatic encephalopathy ([place]) 04/12/2012  . Hepatitis C    treated  . Hypoglycemia   . Hypothyroidism     PAST SURGICAL HISTORY: Past Surgical History:  Procedure Laterality Date  . APPENDECTOMY    . CARPAL TUNNEL RELEASE Bilateral   . COLONOSCOPY    . ESOPHAGOGASTRODUODENOSCOPY     x 2  . ESOPHAGOGASTRODUODENOSCOPY (EGD) WITH PROPOFOL N/A 10/29/2018   Procedure: ESOPHAGOGASTRODUODENOSCOPY (EGD) WITH PROPOFOL;  Surgeon: Rush Landmark [name], MD;  Location: [facility];  Service: Gastroenterology;  Laterality: N/A;  . EUS N/A 10/29/2018   Procedure: UPPER ENDOSCOPIC ULTRASOUND (EUS) RADIAL;  Surgeon: Rush Landmark [name], MD;  Location: [facility];  Service: Gastroenterology;  Laterality: N/A;    MEDICATIONS: Current Outpatient Medications on File Prior to  Visit  Medication Sig Dispense Refill  . acetaminophen (TYLENOL) 500 MG tablet Take 500 mg by mouth every 6 (six) hours as needed for mild pain.    Marland Kitchen albuterol (PROVENTIL HFA;VENTOLIN HFA) 108 (90 Base) MCG/ACT inhaler Inhale 1-2 puffs into the lungs every 6 (six) hours as needed for wheezing or shortness of breath.    . coagulation factor IX, recomb, (BENEFIX) 1000 units injection Inject 4,000 Units into the vein See admin instructions. Whenever having surgery  As directed by hematologist    . Emollient (AQUAPHOR EX) Apply 1 application topically 2 (two) times daily as needed (for dry skin).     Marland Kitchen lactulose (CHRONULAC) 10 GM/15ML solution Take 45 mLs (30 g total) by mouth 3 (three) times daily. 4050 mL 0  . levothyroxine (SYNTHROID, LEVOTHROID) 50 MCG tablet Take 50 mcg by mouth daily.    . magnesium oxide (MAG-OX) 400 (241.3 Mg) MG tablet Take 400 mg by mouth daily.    . pantoprazole (PROTONIX) 40 MG tablet Take 40 mg by mouth daily.  2  . spironolactone (ALDACTONE) 25 MG tablet Take 25 mg by mouth daily.     No current facility-administered medications on file prior to visit.     ALLERGIES: Allergies  Allergen Reactions  . Morphine Other (See Comments)    Caused confusion/During appendectomy  . Zolpidem Other (See Comments)    Confusion     FAMILY HISTORY: Family History  Problem Relation Age of Onset  . Dementia Mother 33  . Heart failure Mother   . Osteoarthritis Mother   . Hypertension Mother   . Hypertension Father   . Heart attack Father   . Breast cancer Sister   . Uterine cancer Sister   . Hypertension Sister   . Hemophilia Brother   . Heart disease Brother   . Hypertension Brother   . Hypothyroidism Sister   . Osteoarthritis Sister   . Heart failure Maternal Grandmother   . Stroke Maternal Grandmother   . Hypertension Maternal Grandmother   . Heart failure Maternal Grandfather   . Diabetes Paternal Grandfather   . Colon cancer Neg Hx   . Esophageal cancer  Neg Hx   . Inflammatory bowel disease Neg Hx   . Liver disease Neg Hx   . Pancreatic cancer Neg Hx   . Rectal cancer Neg Hx   . Stomach cancer Neg Hx     SOCIAL HISTORY: Social History   Socioeconomic History  . Marital status: Married    Spouse name: Not on file  . Number of children: 2  . Years of education: Not on file  . Highest education level: Not on file  Occupational History  . Occupation: retired  Scientific laboratory technician  . Financial resource strain: Not on file  . Food insecurity:    Worry: Not on file    Inability: Not on file  . Transportation needs:    Medical: Not on file    Non-medical: Not on file  Tobacco Use  . Smoking status: Former Smoker    Years: 20.00    Last attempt to quit: 08/1995    Years since quitting: 23.2  . Smokeless tobacco: Never Used  Substance and Sexual Activity  . Alcohol use:  Not Currently  . Drug use: No  . Sexual activity: Not on file  Lifestyle  . Physical activity:    Days per week: Not on file    Minutes per session: Not on file  . Stress: Not on file  Relationships  . Social connections:    Talks on phone: Not on file    Gets together: Not on file    Attends religious service: Not on file    Active member of club or organization: Not on file    Attends meetings of clubs or organizations: Not on file    Relationship status: Not on file  . Intimate partner violence:    Fear of current or ex partner: Not on file    Emotionally abused: Not on file    Physically abused: Not on file    Forced sexual activity: Not on file  Other Topics Concern  . Not on file  Social History Narrative  . Not on file    REVIEW OF SYSTEMS: Constitutional: No fevers, chills, or sweats, no generalized fatigue, change in appetite Eyes: No visual changes, double vision, eye pain Ear, nose and throat: No hearing loss, ear pain, nasal congestion, sore throat Cardiovascular: No chest pain, palpitations Respiratory:  No shortness of breath at rest or  with exertion, wheezes GastrointestinaI: No nausea, vomiting, diarrhea, abdominal pain, fecal incontinence Genitourinary:  No dysuria, urinary retention or frequency Musculoskeletal:  No neck pain, back pain Integumentary: No rash, pruritus, skin lesions Neurological: as above Psychiatric: No depression, insomnia, anxiety Endocrine: No palpitations, fatigue, diaphoresis, mood swings, change in appetite, change in weight, increased thirst Hematologic/Lymphatic:  No anemia, purpura, petechiae. Allergic/Immunologic: no itchy/runny eyes, nasal congestion, recent allergic reactions, rashes  PHYSICAL EXAM: Vitals:   11/06/18 1036  BP: 124/82  Pulse: 86  SpO2: 98%   General: No acute distress Head:  Normocephalic/atraumatic Eyes: Fundoscopic exam shows bilateral sharp discs, no vessel changes, exudates, or hemorrhages Neck: supple, no paraspinal tenderness, full range of motion Back: No paraspinal tenderness Heart: regular rate and rhythm Lungs: Clear to auscultation bilaterally. Vascular: No carotid bruits. Skin/Extremities: No rash, no edema Neurological Exam: Mental status: alert and oriented to person, place, and time, no dysarthria or aphasia, Fund of knowledge is appropriate.  Recent and remote memory are intact.  Attention and concentration are normal.    Able to name objects and repeat phrases. Points taken for copying cube, hands on clock, fluency, and repetition. Montreal Cognitive Assessment  11/06/2018  Visuospatial/ Executive (0/5) 3  Naming (0/3) 3  Attention: Read list of digits (0/2) 1  Attention: Read list of letters (0/1) 1  Attention: Serial 7 subtraction starting at 100 (0/3) 3  Language: Repeat phrase (0/2) 1  Language : Fluency (0/1) 0  Abstraction (0/2) 2  Delayed Recall (0/5) 5  Orientation (0/6) 6  Total 25   Cranial nerves: CN I: not tested CN II: pupils equal, round and reactive to light, visual fields intact, fundi unremarkable. CN III, IV, VI:  full  range of motion, no nystagmus, no ptosis CN V: facial sensation intact CN VII: upper and lower face symmetric CN VIII: hearing intact to finger rub CN IX, X: gag intact, uvula midline CN XI: sternocleidomastoid and trapezius muscles intact CN XII: tongue midline Bulk & Tone: normal, no fasciculations, no cogwheeling. No asterixis. Motor: 5/5 throughout with no pronator drift. Sensation: intact to light touch, cold, pin, vibration and joint position sense.  No extinction to double simultaneous stimulation.  Romberg test  negative Deep Tendon Reflexes: +2 throughout, no ankle clonus Plantar responses: downgoing bilaterally Cerebellar: no incoordination on finger to nose, heel to shin. No dysdiadochokinesia Gait: narrow-based and steady, able to tandem walk adequately. 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 This concern was raised due to repeat hospital admissions for hepatic encephalopathy from August to October 2019, it was noted he was treated with lactulose and mental status improved very quickly, so that this was unlikely the only factor causing his altered mental status. Concern for vascular dementia and depression were raised. Head CT without contrast no acute changes, there was mild generalized atrophy with mild ventricular and sulcal enlargement, mild to moderate chronic microvascular disease. His neurological exam today is normal, MOCA score 25/30. He denies any further confusion now that he is taking Lactulose TID religiously, he denies any difficulties with complex tasks, thus not meeting criteria for dementia, however there is no family present to corroborate history. We discussed the  importance of control of vascular risk factors, physical exercise, and brain stimulation exercises for brain health. He will follow-up on a prn basis and knows to contact our office and have family present if more memory concerns arise.   Thank you for allowing me to participate in the care of this patient. Please do not hesitate to call for any questions or concerns.   Ellouise Newer, M.D.  CC: Dr. Sandi Carne, Dr. Luciana Axe

## 2018-11-07 ENCOUNTER — Ambulatory Visit (INDEPENDENT_AMBULATORY_CARE_PROVIDER_SITE_OTHER): Payer: Medicare Other | Admitting: Gastroenterology

## 2018-11-07 DIAGNOSIS — Z23 Encounter for immunization: Secondary | ICD-10-CM

## 2018-11-07 DIAGNOSIS — K7460 Unspecified cirrhosis of liver: Secondary | ICD-10-CM

## 2020-09-21 ENCOUNTER — Ambulatory Visit: Payer: Medicare Other | Admitting: Physical Therapy

## 2020-09-28 ENCOUNTER — Other Ambulatory Visit: Payer: Self-pay

## 2020-09-28 ENCOUNTER — Ambulatory Visit: Payer: Medicare Other | Attending: Family Medicine

## 2020-09-28 DIAGNOSIS — M25652 Stiffness of left hip, not elsewhere classified: Secondary | ICD-10-CM | POA: Diagnosis present

## 2020-09-28 DIAGNOSIS — R2689 Other abnormalities of gait and mobility: Secondary | ICD-10-CM | POA: Insufficient documentation

## 2020-09-28 DIAGNOSIS — M25651 Stiffness of right hip, not elsewhere classified: Secondary | ICD-10-CM | POA: Insufficient documentation

## 2020-09-28 DIAGNOSIS — M25561 Pain in right knee: Secondary | ICD-10-CM | POA: Insufficient documentation

## 2020-09-28 NOTE — Therapy (Signed)
[facility] [address] [place], [place], [address] Phone: [phone]   Fax:  [phone]  Physical Therapy Treatment  Patient Details  Name: Justin Hampton MRN: [id number] Date of Birth: 04-Apr-1946 Referring Provider (PT): Arn Medal, MD   Encounter Date: 09/28/2020   PT End of Session - 09/28/20 1116    Visit Number 1    Date for PT Re-Evaluation 11/23/20    Authorization Type [employer] medicare    PT Start Time 1018    PT Stop Time 1057    PT Time Calculation (min) 39 min    Activity Tolerance Patient tolerated treatment well    Behavior During Therapy [facility] for tasks assessed/performed           Past Medical History:  Diagnosis Date  . Adenomatous colon polyp   . Arthritis    fingers  . 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 Cirrhosis ([place])   . Diverticulosis   . GERD (gastroesophageal reflux disease)   . 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 Hepatic encephalopathy ([place]) 04/12/2012  . Hepatitis C    treated  . Hypoglycemia   . Hypothyroidism     Past Surgical History:  Procedure Laterality Date  . APPENDECTOMY    . CARPAL TUNNEL RELEASE Bilateral   . COLONOSCOPY    . ESOPHAGOGASTRODUODENOSCOPY     x 2  . ESOPHAGOGASTRODUODENOSCOPY (EGD) WITH PROPOFOL N/A 10/29/2018   Procedure: ESOPHAGOGASTRODUODENOSCOPY (EGD) WITH PROPOFOL;  Surgeon: Rush Landmark [name], MD;  Location: [facility];  Service: Gastroenterology;  Laterality: N/A;  . EUS N/A 10/29/2018   Procedure: UPPER ENDOSCOPIC ULTRASOUND (EUS) RADIAL;  Surgeon: Rush Landmark [name], MD;  Location: [facility];  Service: Gastroenterology;  Laterality: N/A;    There were no vitals filed for this visit.   Subjective Assessment - 09/28/20 1024    Subjective Pt presents to PT with a 3 weeks history of Rt knee pain that began without cause.  Pt reports that he usually has pain with sit to stand or with standing too  long    Pertinent History use of cane    Limitations Standing;Walking;Sitting    How long can you sit comfortably? pain in Rt knee after sitting too long    Diagnostic tests x-ray: OA of Rt knee    Patient Stated Goals reduce Rt knee pain with standing and with sit to stand transition    Currently in Pain? Yes    Pain Score 0-No pain   up to 8/10- brief with change of position   Pain Location Knee    Pain Orientation Right    Pain Descriptors / Indicators Burning;Dull    Pain Type Acute pain    Pain Onset More than a month ago    Pain Frequency Intermittent    Aggravating Factors  sit to stand transition, standing too long    Pain Relieving Factors time after transition (it works itself out)              [facility] PT Assessment - 09/28/20 0001      Assessment   Medical Diagnosis primary OA of the Rt knee    Referring Provider (PT) Arn Medal, MD    Onset Date/Surgical Date 09/07/20    Prior Therapy none      Precautions   Precautions Fall      Restrictions   Weight Bearing Restrictions No      Balance Screen  Has the patient fallen in the past 6 months No    Has the patient had a decrease in activity level because of a fear of falling?  No    Is the patient reluctant to leave their home because of a fear of falling?  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  Lt & Rt knee extension limited by 5 degrees.      PROM   Overall PROM  Deficits    Overall PROM Comments hip IR limited to 0 degrees, SLR to 65 degrees bil.      Strength   Overall Strength Within functional limits for tasks performed      Palpation   Palpation comment no palpable tenderness over Rt knee, quad or hip today.        Transfers   Transfers Sit to Stand;Stand to Sit    Sit to Stand 6: Modified independent (Device/Increase time)    Stand to Sit 6: Modified independent (Device/Increase time)      Ambulation/Gait   Ambulation/Gait Yes    Ambulation/Gait Assistance 6: Modified independent (Device/Increase time)    Assistive device Straight cane    Gait Pattern Step-through pattern;Decreased step length - right;Decreased step length - left;Trunk flexed;Wide base of support    Gait Comments ER at bil hips                                 PT Education - 09/28/20 1045    Education Details Access Code: [email or web address]    Person(s) Educated Patient    Methods Explanation;Demonstration;Handout    Comprehension Verbalized understanding;Returned demonstration            PT Short Term Goals - 09/28/20 1022      PT SHORT TERM GOAL #1   Title be independent in initial HEP    Time 4    Period Weeks    Status New    Target Date 10/26/20      PT SHORT TERM GOAL #2   Title report < or = to 5/10 Rt knee pain with sit to stand transition    Time 4    Period Weeks    Status New    Target Date 10/26/20             PT Long Term Goals - 09/28/20 1111      PT LONG TERM GOAL #1   Title be independent  in advanced HEP    Time 8    Period Weeks    Status New    Target Date 11/23/20      PT LONG TERM GOAL #2   Title reduce FOTO to < or = to 36% limitation     Time 8    Period Weeks    Status New    Target Date 11/23/20      PT LONG TERM GOAL #3   Title perform sit to stand with < or = to 2/10 Rt knee pain    Time 8    Period Weeks    Target Date 11/23/20      PT LONG TERM GOAL #4   Title improve LE functional strength to perform sit to stand with min to no UE support    Time 8    Period Weeks    Status New    Target Date 11/23/20                 Plan - 09/28/20 1056    Clinical Impression Statement Pt presents to PT with complaints of Rt knee pain that began ~3 weeks ago without incident.  Pt reports that he was doing Silver Sneakers and is no longer doing the classes because he isn't sure if he should.  Pt reports Rt knee pain that is burning and dull mostly when performing sit to stand transition and when standing long periods.  This pain is brief and reduces after a few minutes after transition.  Recent x-ray showed OA in the Rt knee.  Pt without palpable tenderness over the Rt hip, knee or thigh musculature.  Pt ambulates with bil. Shortened step length, flexed trunk and ER at bil hips.  Pt with significant limitation in hip IR and SLR flexibility bilaterally.  Pt requires moderate UE support with sit to stand transition.  Pt will benefit from skilled PT to address Rt knee pain and reduce pain with transitional movements and standing and normalize flexibility in the hips.    Personal Factors and Comorbidities Age;Comorbidity 1    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    Examination-Activity Limitations Locomotion Level;Sit    Examination-Participation Restrictions Community Activity    Stability/Clinical Decision Making Stable/Uncomplicated    Clinical Decision Making Low    Rehab Potential Excellent    PT Frequency 1x / week    PT Duration 8 weeks    PT Treatment/Interventions ADLs/Self Care Home Management;Cryotherapy;Moist Heat;Electrical Stimulation;Stair training;Gait training;Functional mobility training;Balance training;Neuromuscular  re-education;Therapeutic activities;Therapeutic exercise;Patient/family education;Manual techniques;Dry needling;Taping    PT Next Visit Plan review HEP, add gastroc stretch, passive hip IR stretch, gait with emphasis on posture    PT Home Exercise Plan Access Code: [email or web address]    Consulted and Agree with Plan of Care Patient           Patient will benefit from skilled therapeutic intervention in order to improve the following deficits and impairments:  Abnormal gait, Decreased activity tolerance, Difficulty walking, Impaired flexibility, Decreased range of motion, Postural dysfunction, Pain  Visit Diagnosis: Acute pain of right knee - Plan: PT plan of care cert/re-cert  Other abnormalities of gait and mobility - Plan: PT plan of care cert/re-cert  Stiffness of left hip, not elsewhere classified - Plan: PT plan of care cert/re-cert  Stiffness of right hip, not elsewhere classified - Plan: PT plan of care cert/re-cert     Problem List Patient Active Problem List   Diagnosis Date Noted  . Hypoglycemia 10/31/2018  .  Impaired functional mobility, balance, gait, and endurance 10/31/2018  . Dilation of pancreatic duct 10/02/2018  . 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[place]) 09/28/2018  . History of encephalopathy 09/28/2018  . Vascular dementia without behavioral disturbance ([place])   . History of hepatitis C   . Hypothyroidism (acquired)   . Gastroesophageal reflux disease   . Aneurysm, cerebral, nonruptured   . AMS (altered mental status) 08/17/2018  . Gait disturbance 08/02/2018  . Mild memory disturbance 08/02/2018  . Hepatic encephalopathy ([place]) 07/13/2018  . 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[place]) 07/13/2018  . CKD (chronic kidney disease), stage III ([place]) 07/13/2018  . Cervical stenosis of spine 05/29/2018  . Degenerative disc disease, cervical 05/29/2018  . Iron deficiency anemia due to chronic blood loss 09/12/2017  .  Rectal bleeding 06/06/2017  . Acute renal failure (ARF) ([place]) 06/06/2017  . Hyponatremia 06/06/2017  . Hyperkalemia 06/06/2017  . Anemia 06/06/2017  . GI bleeding 06/06/2017  . 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 Other dental procedure status 05/22/2017  . Family history of CHF (congestive heart failure) 02/07/2017  . Family history of sudden death 02-07-17  . Dyspnea Jul 26, 2016  . Other disorder of circulatory system 04/12/2012  . Phlebolithiasis 04/12/2012  . Abnormal prostate by palpation 04/11/2012  . Benign neoplasm of colon 04/11/2012  . Degenerative joint disease of pelvic region 04/11/2012  . Disorder of prostate 04/11/2012  . Iron excess 04/11/2012  . Lumbosacral spondylosis without myelopathy 04/11/2012  . Nocturia 04/11/2012  . Orthostatic hypotension 04/11/2012  . 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  . Left sided sciatica 04/11/2012  . History of colonic polyps 10/03/2011    Sigurd Sos, PT 09/28/20 11:31 AM  [facility] [address], [place], [place], [address] Phone: [phone]   Fax:  [phone]  Name: Tierre Netto MRN: [id number] Date of Birth: 04-18-1946

## 2020-09-28 NOTE — Patient Instructions (Signed)
Access Code: [email or web address] URL: [email or web address] Date: 09/28/2020 Prepared by: Claiborne Billings  Exercises Seated Long Arc Quad - 3 x daily - 7 x weekly - 10 sets - 10 reps - 5 hold Seated March - 3 x daily - 7 x weekly - 10 reps - 3 sets Seated Hamstring Stretch - 3 x daily - 7 x weekly - 1 sets - 3 reps - 20 hold Supine Piriformis Stretch with Towel - 2 x daily - 7 x weekly - 1 sets - 3 reps - 20 hold Heel rises with counter support - 1 x daily - 7 x weekly - 3 sets - 10 reps

## 2020-10-08 ENCOUNTER — Ambulatory Visit: Payer: Medicare Other

## 2020-10-08 ENCOUNTER — Other Ambulatory Visit: Payer: Self-pay

## 2020-10-08 DIAGNOSIS — M25651 Stiffness of right hip, not elsewhere classified: Secondary | ICD-10-CM

## 2020-10-08 DIAGNOSIS — M25652 Stiffness of left hip, not elsewhere classified: Secondary | ICD-10-CM

## 2020-10-08 DIAGNOSIS — R2689 Other abnormalities of gait and mobility: Secondary | ICD-10-CM

## 2020-10-08 DIAGNOSIS — M25561 Pain in right knee: Secondary | ICD-10-CM | POA: Diagnosis not present

## 2020-10-08 NOTE — Patient Instructions (Signed)
Access Code: [email or web address] URL: [email or web address] Date: 10/08/2020 Prepared by: Claiborne Billings  Exercises  Standing Gastroc Stretch - 1 x daily - 7 x weekly - 1 sets - 3 reps - 20 hold Sit to Stand - 2 x daily - 7 x weekly - 2 sets - 5 reps

## 2020-10-08 NOTE — Therapy (Addendum)
[facility] [address] [place], [place], [address] Phone: [phone]   Fax:  [phone]  Physical Therapy Treatment  Patient Details  Name: Justin Hampton MRN: [id number] Date of Birth: 1945-11-25 Referring Provider (PT): Arn Medal, MD   Encounter Date: 10/08/2020   PT End of Session - 10/08/20 1312    Visit Number 2    Date for PT Re-Evaluation 11/23/20    Authorization Type UHC medicare    PT Start Time 1232    PT Stop Time 1312    PT Time Calculation (min) 40 min    Activity Tolerance Patient tolerated treatment well    Behavior During Therapy [facility] for tasks assessed/performed           Past Medical History:  Diagnosis Date  . Adenomatous colon polyp   . Arthritis    fingers  . 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 Cirrhosis ([place])   . Diverticulosis   . GERD (gastroesophageal reflux disease)   . 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 Hepatic encephalopathy ([place]) 04/12/2012  . Hepatitis C    treated  . Hypoglycemia   . Hypothyroidism     Past Surgical History:  Procedure Laterality Date  . APPENDECTOMY    . CARPAL TUNNEL RELEASE Bilateral   . COLONOSCOPY    . ESOPHAGOGASTRODUODENOSCOPY     x 2  . ESOPHAGOGASTRODUODENOSCOPY (EGD) WITH PROPOFOL N/A 10/29/2018   Procedure: ESOPHAGOGASTRODUODENOSCOPY (EGD) WITH PROPOFOL;  Surgeon: Rush Landmark [name], MD;  Location: [place];  Service: Gastroenterology;  Laterality: N/A;  . EUS N/A 10/29/2018   Procedure: UPPER ENDOSCOPIC ULTRASOUND (EUS) RADIAL;  Surgeon: Rush Landmark [name], MD;  Location: [place];  Service: Gastroenterology;  Laterality: N/A;    There were no vitals filed for this visit.   Subjective Assessment - 10/08/20 1236    Subjective I have been doing my exercises and I am having trouble getting on/off the floor for the stretch.    Patient Stated Goals reduce Rt knee pain with standing and  with sit to stand transition    Currently in Pain? No/denies    Pain Score --   max of 8/10 with transition and this is brief.                            [facility] Adult PT Treatment/Exercise - 10/08/20 0001      Exercises   Exercises Knee/Hip;Ankle      Knee/Hip Exercises: Stretches   Active Hamstring Stretch Both;2 reps;30 seconds    Active Hamstring Stretch Limitations tactile cues to keep knee straight    Piriformis Stretch 2 reps;20 seconds    Piriformis Stretch Limitations supine with towel    Gastroc Stretch 3 reps;20 seconds;Both      Knee/Hip Exercises: Aerobic   Nustep Level 1x 8 minutes-PT present to discuss progress      Knee/Hip Exercises: Seated   Long Arc Quad Strengthening;2 sets;10 reps    Marching Both;2 sets;10 reps    Sit to [employer] 2 sets;5 reps      Manual Therapy   Manual Therapy Passive ROM    Manual therapy comments IR stetch to Rt hip with bent and straight knee                  PT Education - 10/08/20 1306    Education Details Access Code: [email or web address]  Person(s) Educated Patient    Methods Explanation;Demonstration;Handout    Comprehension Verbalized understanding;Returned demonstration            PT Short Term Goals - 09/28/20 1022      PT SHORT TERM GOAL #1   Title be independent in initial HEP    Time 4    Period Weeks    Status New    Target Date 10/26/20      PT SHORT TERM GOAL #2   Title report < or = to 5/10 Rt knee pain with sit to stand transition    Time 4    Period Weeks    Status New    Target Date 10/26/20             PT Long Term Goals - 09/28/20 1111      PT LONG TERM GOAL #1   Title be independent in advanced HEP    Time 8    Period Weeks    Status New    Target Date 11/23/20      PT LONG TERM GOAL #2   Title reduce FOTO to < or = to 36% limitation    Time 8    Period Weeks    Status New    Target Date 11/23/20      PT LONG TERM GOAL #3   Title perform sit to stand with < or =  to 2/10 Rt knee pain    Time 8    Period Weeks    Target Date 11/23/20      PT LONG TERM GOAL #4   Title improve LE functional strength to perform sit to stand with min to no UE support    Time 8    Period Weeks    Status New    Target Date 11/23/20                 Plan - 10/08/20 1310    Clinical Impression Statement Pt with first time follow-up after evaluation.  Pt has been independent and compliant with HEP for strength and flexibility.  Pt performed all exercises correctly with only minor cueing for technique.  Pt did require more cueing for hamstring stretch to keep knee straight with this.  Pt has returned to his [employer]ne[employer] classes without difficulty.  Pt with significant limited flexibility in the Rt hip.  Pt performed sit to stand with reduced need for UE support today.  No pain experienced with exercises in the clinic today.  Pt will continue to benefit from skilled PT to address Rt knee pain, hip flexibility, LE strength and gait.    PT Frequency 1x / week    PT Duration 8 weeks    PT Treatment/Interventions ADLs/Self Care Home Management;Cryotherapy;Moist Heat;Electrical Stimulation;Stair training;Gait training;Functional mobility training;Balance training;Neuromuscular re-education;Therapeutic activities;Therapeutic exercise;Patient/family education;Manual techniques;Dry needling;Taping    PT Next Visit Plan review new HEP, work on posture with emphasis on gait, Rt hip stretching    PT Home Exercise Plan Access Code: [email or web address]    Consulted and Agree with Plan of Care Patient           Patient will benefit from skilled therapeutic intervention in order to improve the following deficits and impairments:  Abnormal gait,Decreased activity tolerance,Difficulty walking,Impaired flexibility,Decreased range of motion,Postural dysfunction,Pain  Visit Diagnosis: Acute pain of right knee  Other abnormalities of gait and mobility  Stiffness of left hip, not elsewhere  classified  Stiffness of right hip, not elsewhere classified  Problem List Patient Active Problem List   Diagnosis Date Noted  . Hypoglycemia 10/31/2018  . Impaired functional mobility, balance, gait, and endurance 10/31/2018  . Dilation of pancreatic duct 10/02/2018  . 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 History of encephalopathy 09/28/2018  . Vascular dementia without behavioral disturbance ([place])   . History of hepatitis C   . Hypothyroidism (acquired)   . Gastroesophageal reflux disease   . Aneurysm, cerebral, nonruptured   . AMS (altered mental status) 08/17/2018  . Gait disturbance 08/02/2018  . Mild memory disturbance 08/02/2018  . Hepatic encephalopathy ([place]) 07/13/2018  . 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 CKD (chronic kidney disease), stage III ([place]) 07/13/2018  . Cervical stenosis of spine 05/29/2018  . Degenerative disc disease, cervical 05/29/2018  . Iron deficiency anemia due to chronic blood loss 09/12/2017  . Rectal bleeding 06/06/2017  . Acute renal failure (ARF) ([place]) 06/06/2017  . Hyponatremia 06/06/2017  . Hyperkalemia 06/06/2017  . Anemia 06/06/2017  . GI bleeding 06/06/2017  . 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 Other dental procedure status 05/22/2017  . Family history of CHF (congestive heart failure) 01/27/17  . Family history of sudden death 01-27-17  . Dyspnea 07/15/16  . Other disorder of circulatory system 04/12/2012  . Phlebolithiasis 04/12/2012  . Abnormal prostate by palpation 04/11/2012  . Benign neoplasm of colon 04/11/2012  . Degenerative joint disease of pelvic region 04/11/2012  . Disorder of prostate 04/11/2012  . Iron excess 04/11/2012  . Lumbosacral spondylosis without myelopathy 04/11/2012  . Nocturia 04/11/2012  . Orthostatic hypotension 04/11/2012  . 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  .  Left sided sciatica 04/11/2012  . History of colonic polyps 10/03/2011     Sigurd Sos, PT 10/08/20 1:19 PM  [facility] [address] [place], [place], [address] Phone: [phone]   Fax:  [phone]  Name: Justin Hampton MRN: [id number] Date of Birth: [birth year]

## 2020-10-13 ENCOUNTER — Ambulatory Visit: Payer: Medicare Other

## 2020-10-13 ENCOUNTER — Other Ambulatory Visit: Payer: Self-pay

## 2020-10-13 DIAGNOSIS — M25561 Pain in right knee: Secondary | ICD-10-CM | POA: Diagnosis not present

## 2020-10-13 DIAGNOSIS — M25651 Stiffness of right hip, not elsewhere classified: Secondary | ICD-10-CM

## 2020-10-13 DIAGNOSIS — M25652 Stiffness of left hip, not elsewhere classified: Secondary | ICD-10-CM

## 2020-10-13 DIAGNOSIS — R2689 Other abnormalities of gait and mobility: Secondary | ICD-10-CM

## 2020-10-13 NOTE — Therapy (Signed)
[facility] [address] [place], [place], [address] Phone: [phone]   Fax:  [phone]  Physical Therapy Treatment  Patient Details  Name: Justin Hampton MRN: [id number] Date of Birth: Oct 22, 1946 Referring Provider (PT): Arn Medal, MD   Encounter Date: 10/13/2020   PT End of Session - 10/13/20 0925    Visit Number 3    Date for PT Re-Evaluation 11/23/20    Authorization Type UHC medicare    PT Start Time 0846    PT Stop Time 0927    PT Time Calculation (min) 41 min    Activity Tolerance Patient tolerated treatment well    Behavior During Therapy [facility] for tasks assessed/performed           Past Medical History:  Diagnosis Date  . Adenomatous colon polyp   . Arthritis    fingers  . 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 Cirrhosis ([place])   . Diverticulosis   . GERD (gastroesophageal reflux disease)   . 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 Hepatic encephalopathy ([place]) 04/12/2012  . Hepatitis C    treated  . Hypoglycemia   . Hypothyroidism     Past Surgical History:  Procedure Laterality Date  . APPENDECTOMY    . CARPAL TUNNEL RELEASE Bilateral   . COLONOSCOPY    . ESOPHAGOGASTRODUODENOSCOPY     x 2  . ESOPHAGOGASTRODUODENOSCOPY (EGD) WITH PROPOFOL N/A 10/29/2018   Procedure: ESOPHAGOGASTRODUODENOSCOPY (EGD) WITH PROPOFOL;  Surgeon: Rush Landmark [name], MD;  Location: [facility];  Service: Gastroenterology;  Laterality: N/A;  . EUS N/A 10/29/2018   Procedure: UPPER ENDOSCOPIC ULTRASOUND (EUS) RADIAL;  Surgeon: Rush Landmark [name], MD;  Location: [place];  Service: Gastroenterology;  Laterality: N/A;    There were no vitals filed for this visit.   Subjective Assessment - 10/13/20 0856    Subjective I feel 25-30% better.    Currently in Pain? Yes    Pain Score 2     Pain Location Knee    Pain Orientation Right    Pain Descriptors / Indicators  Burning;Dull    Pain Type Acute pain    Pain Onset More than a month ago    Pain Frequency Intermittent    Aggravating Factors  sit to stand transition, standing too long    Pain Relieving Factors after moving around                             [facility] Adult PT Treatment/Exercise - 10/13/20 0001      Knee/Hip Exercises: Stretches   Active Hamstring Stretch Both;2 reps;30 seconds    Active Hamstring Stretch Limitations tactile cues to keep knee straight      Knee/Hip Exercises: Aerobic   Nustep Level 2x 8 minutes-PT present to discuss progress      Knee/Hip Exercises: Standing   Heel Raises Both;2 sets;10 reps    Hip Extension Stengthening;Both;2 sets;10 reps    Rocker Board 3 minutes      Knee/Hip Exercises: Seated   Long Arc Quad Strengthening;2 sets;10 reps    Marching Both;2 sets;10 reps    Sit to [employer] 2 sets;5 reps                  PT Education - 10/13/20 [phone]    Education Details Access Code: [email or web address]    Person(s) Educated Patient    Methods Explanation;Demonstration;Handout  Comprehension Verbalized understanding;Returned demonstration            PT Short Term Goals - 10/13/20 0857      PT SHORT TERM GOAL #1   Title be independent in initial HEP    Status Achieved      PT SHORT TERM GOAL #2   Title report < or = to 5/10 Rt knee pain with sit to stand transition    Baseline 25-30% better    Status On-going             PT Long Term Goals - 09/28/20 1111      PT LONG TERM GOAL #1   Title be independent in advanced HEP    Time 8    Period Weeks    Status New    Target Date 11/23/20      PT LONG TERM GOAL #2   Title reduce FOTO to < or = to 36% limitation    Time 8    Period Weeks    Status New    Target Date 11/23/20      PT LONG TERM GOAL #3   Title perform sit to stand with < or = to 2/10 Rt knee pain    Time 8    Period Weeks    Target Date 11/23/20      PT LONG TERM GOAL #4   Title improve LE functional  strength to perform sit to stand with min to no UE support    Time 8    Period Weeks    Status New    Target Date 11/23/20                 Plan - 10/13/20 0908    Clinical Impression Statement Pt has been independent and compliant with HEP for strength and flexibility. Pt reports 25-30% overall improvement in Rt knee pain since the start of care.  Pt continues to be challenged with Rt hamstring flexibility and requires tactile cueing for alignment with this stretch.  Pt continues to attend his [employer] classes without difficulty.  Pt with significant limited flexibility in the Rt hip.  PT provided stand by assistance for exercises today for safety and required verbal cueing for alignment and technique.  No pain experienced with exercises in the clinic today.  Pt will continue to benefit from skilled PT to address Rt knee pain, hip flexibility, LE strength and gait.    PT Frequency 1x / week    PT Duration 8 weeks    PT Treatment/Interventions ADLs/Self Care Home Management;Cryotherapy;Moist Heat;Electrical Stimulation;Stair training;Gait training;Functional mobility training;Balance training;Neuromuscular re-education;Therapeutic activities;Therapeutic exercise;Patient/family education;Manual techniques;Dry needling;Taping    PT Next Visit Plan work on posture with emphasis on gait, Rt hip stretching    PT Home Exercise Plan Access Code: [email or web address]    Consulted and Agree with Plan of Care Patient           Patient will benefit from skilled therapeutic intervention in order to improve the following deficits and impairments:  Abnormal gait,Decreased activity tolerance,Difficulty walking,Impaired flexibility,Decreased range of motion,Postural dysfunction,Pain  Visit Diagnosis: Acute pain of right knee  Other abnormalities of gait and mobility  Stiffness of left hip, not elsewhere classified  Stiffness of right hip, not elsewhere classified     Problem List Patient Active  Problem List   Diagnosis Date Noted  . Hypoglycemia 10/31/2018  . Impaired functional mobility, balance, gait, and endurance 10/31/2018  . Dilation of pancreatic duct 10/02/2018  . Hepatitis  B core antibody positive 09/28/2018  . 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 History of encephalopathy 09/28/2018  . Vascular dementia without behavioral disturbance ([place])   . History of hepatitis C   . Hypothyroidism (acquired)   . Gastroesophageal reflux disease   . Aneurysm, cerebral, nonruptured   . AMS (altered mental status) 08/17/2018  . Gait disturbance 08/02/2018  . Mild memory disturbance 08/02/2018  . Hepatic encephalopathy ([place]) 07/13/2018  . 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 CKD (chronic kidney disease), stage III ([place]) 07/13/2018  . Cervical stenosis of spine 05/29/2018  . Degenerative disc disease, cervical 05/29/2018  . Iron deficiency anemia due to chronic blood loss 09/12/2017  . Rectal bleeding 06/06/2017  . Acute renal failure (ARF) ([place]) 06/06/2017  . Hyponatremia 06/06/2017  . Hyperkalemia 06/06/2017  . Anemia 06/06/2017  . GI bleeding 06/06/2017  . 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 Other dental procedure status 05/22/2017  . Family history of CHF (congestive heart failure) January 31, 2017  . Family history of sudden death January 31, 2017  . Dyspnea 07-19-16  . Other disorder of circulatory system 04/12/2012  . Phlebolithiasis 04/12/2012  . Abnormal prostate by palpation 04/11/2012  . Benign neoplasm of colon 04/11/2012  . Degenerative joint disease of pelvic region 04/11/2012  . Disorder of prostate 04/11/2012  . Iron excess 04/11/2012  . Lumbosacral spondylosis without myelopathy 04/11/2012  . Nocturia 04/11/2012  . Orthostatic hypotension 04/11/2012  . 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  . Left sided sciatica 04/11/2012  . History of colonic polyps 10/03/2011     Sigurd Sos,  PT 10/13/20 9:30 AM  [facility] [address] [place], [place], [address] Phone: [phone]   Fax:  [phone]  Name: Justin Hampton MRN: [id number] Date of Birth: [birth year]

## 2020-10-13 NOTE — Patient Instructions (Signed)
Access Code: [email or web address] URL: [email or web address] Date: 10/13/2020 Prepared by: Claiborne Billings  Exercises  Heel rises with counter support - 1 x daily - 7 x weekly - 2 sets - 10 reps Shoulder Extension with Resistance - 1 x daily - 7 x weekly - 2 sets - 10 reps

## 2020-10-26 ENCOUNTER — Ambulatory Visit: Payer: Medicare Other | Admitting: Physical Therapy

## 2020-10-28 ENCOUNTER — Encounter: Payer: Self-pay | Admitting: Physical Therapy

## 2020-10-28 ENCOUNTER — Other Ambulatory Visit: Payer: Self-pay

## 2020-10-28 ENCOUNTER — Ambulatory Visit: Payer: Medicare Other | Attending: Family Medicine | Admitting: Physical Therapy

## 2020-10-28 DIAGNOSIS — M25651 Stiffness of right hip, not elsewhere classified: Secondary | ICD-10-CM | POA: Diagnosis present

## 2020-10-28 DIAGNOSIS — R2689 Other abnormalities of gait and mobility: Secondary | ICD-10-CM | POA: Diagnosis present

## 2020-10-28 DIAGNOSIS — M25561 Pain in right knee: Secondary | ICD-10-CM | POA: Diagnosis not present

## 2020-10-28 DIAGNOSIS — M25652 Stiffness of left hip, not elsewhere classified: Secondary | ICD-10-CM | POA: Diagnosis present

## 2020-10-28 NOTE — Therapy (Signed)
[facility] [address], [address] [place], [place], [address] Phone: [phone]   Fax:  [phone]  Physical Therapy Treatment  Patient Details  Name: Justin Hampton MRN: [id number] Date of Birth: [birth year] Referring Provider (PT): Eartha Inch, MD   Encounter Date: 10/28/2020   PT End of Session - 10/28/20 1413    Visit Number 4    Date for PT Re-Evaluation 11/23/20    Authorization Type UHC medicare    PT Start Time 1400    PT Stop Time 1438    PT Time Calculation (min) 38 min    Activity Tolerance Patient limited by pain    Behavior During Therapy [facility] for tasks assessed/performed           Past Medical History:  Diagnosis Date  . Adenomatous colon polyp   . Arthritis    fingers  . 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 Cirrhosis (HCC)   . Diverticulosis   . GERD (gastroesophageal reflux disease)   . 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HCC)    Hemophilia B  . Hepatic encephalopathy (HCC) 04/12/2012  . Hepatitis C    treated  . Hypoglycemia   . Hypothyroidism     Past Surgical History:  Procedure Laterality Date  . APPENDECTOMY    . CARPAL TUNNEL RELEASE Bilateral   . COLONOSCOPY    . ESOPHAGOGASTRODUODENOSCOPY     x 2  . ESOPHAGOGASTRODUODENOSCOPY (EGD) WITH PROPOFOL N/A 10/29/2018   Procedure: ESOPHAGOGASTRODUODENOSCOPY (EGD) WITH PROPOFOL;  Surgeon: Meridee Score Netty Starring., MD;  Location: [facility] ENDOSCOPY;  Service: Gastroenterology;  Laterality: N/A;  . EUS N/A 10/29/2018   Procedure: UPPER ENDOSCOPIC ULTRASOUND (EUS) RADIAL;  Surgeon: Meridee Score Netty Starring., MD;  Location: [facility] ENDOSCOPY;  Service: Gastroenterology;  Laterality: N/A;    There were no vitals filed for this visit.   Subjective Assessment - 10/28/20 1411    Subjective Not sure why but my pain has been really high last few days. it basically hurts with all movements. HEP used to help but hasn't really helped since  this this last flare  up.    How long can you sit comfortably? pain in Rt knee after sitting too long    Currently in Pain? Yes    Pain Score 8     Pain Location Knee    Pain Orientation Right    Pain Descriptors / Indicators Sharp;Sore;Throbbing    Aggravating Factors  after sitting    Pain Relieving Factors moving around    Multiple Pain Sites No                             [facility] Adult PT Treatment/Exercise - 10/28/20 0001      Knee/Hip Exercises: Aerobic   Nustep L1 x 8 minPTA present to discuss status      Knee/Hip Exercises: Seated   Long Arc Quad Strengthening;2 sets;10 reps    Long Arc Quad Limitations knee pain    Ball Squeeze 5 sec hold 10x    Clamshell with TheraBand Yellow   10x   Marching Both;2 sets;10 reps   added yellow loop     Manual Therapy   Other Manual Therapy Biofreeze application to Rt knee, soft tissue work to [employer]: pt seated                    PT Short Term Goals - 10/13/20 [phone]  PT SHORT TERM GOAL #1   Title be independent in initial HEP    Status Achieved      PT SHORT TERM GOAL #2   Title report < or = to 5/10 Rt knee pain with sit to stand transition    Baseline 25-30% better    Status On-going             PT Long Term Goals - 09/28/20 1111      PT LONG TERM GOAL #1   Title be independent in advanced HEP    Time 8    Period Weeks    Status New    Target Date 11/23/20      PT LONG TERM GOAL #2   Title reduce FOTO to < or = to 36% limitation    Time 8    Period Weeks    Status New    Target Date 11/23/20      PT LONG TERM GOAL #3   Title perform sit to stand with < or = to 2/10 Rt knee pain    Time 8    Period Weeks    Target Date 11/23/20      PT LONG TERM GOAL #4   Title improve LE functional strength to perform sit to stand with min to no UE support    Time 8    Period Weeks    Status New    Target Date 11/23/20                 Plan - 10/28/20 1415    Clinical Impression Statement Pt  arrives with increased pain over th elast 2-3 days. Pt unsure why but MAYBE related to the weather. Pt with slow, antalgic gait. Pain did lessen some with exercise/movement but not much. Near end of sesison PTA  applied biofreeze to RT knee and did some soft tissue work to [employer].pt reported less pain after and could move with less caution.    Personal Factors and Comorbidities Age;Comorbidity 1    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    Examination-Activity Limitations Locomotion Level;Sit    Examination-Participation Restrictions Community Activity    Stability/Clinical Decision Making Stable/Uncomplicated    Rehab Potential Excellent    PT Frequency 1x / week    PT Duration 8 weeks    PT Treatment/Interventions ADLs/Self Care Home Management;Cryotherapy;Moist Heat;Electrical Stimulation;Stair training;Gait training;Functional mobility training;Balance training;Neuromuscular re-education;Therapeutic activities;Therapeutic exercise;Patient/family education;Manual techniques;Dry needling;Taping    PT Next Visit Plan Assess pain. Go from there    PT Home Exercise Plan Access Code: [email or web address]    Consulted and Agree with Plan of Care Patient           Patient will benefit from skilled therapeutic intervention in order to improve the following deficits and impairments:  Abnormal gait,Decreased activity tolerance,Difficulty walking,Impaired flexibility,Decreased range of motion,Postural dysfunction,Pain  Visit Diagnosis: Acute pain of right knee  Other abnormalities of gait and mobility  Stiffness of left hip, not elsewhere classified  Stiffness of right hip, not elsewhere classified     Problem List Patient Active Problem List   Diagnosis Date Noted  . Hypoglycemia 10/31/2018  . Impaired functional mobility, balance, gait, and endurance 10/31/2018  . Dilation of pancreatic duct 10/02/2018  . 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  History of encephalopathy 09/28/2018  . Vascular dementia without behavioral disturbance ([place])   . History of hepatitis C   . Hypothyroidism (acquired)   .  Gastroesophageal reflux disease   . Aneurysm, cerebral, nonruptured   . AMS (altered mental status) 08/17/2018  . Gait disturbance 08/02/2018  . Mild memory disturbance 08/02/2018  . Hepatic encephalopathy ([place]) 07/13/2018  . 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 CKD (chronic kidney disease), stage III ([place]) 07/13/2018  . Cervical stenosis of spine 05/29/2018  . Degenerative disc disease, cervical 05/29/2018  . Iron deficiency anemia due to chronic blood loss 09/12/2017  . Rectal bleeding 06/06/2017  . Acute renal failure (ARF) ([place]) 06/06/2017  . Hyponatremia 06/06/2017  . Hyperkalemia 06/06/2017  . Anemia 06/06/2017  . GI bleeding 06/06/2017  . 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 Other dental procedure status 05/22/2017  . Family history of CHF (congestive heart failure) 02-05-17  . Family history of sudden death February 05, 2017  . Dyspnea July 24, 2016  . Other disorder of circulatory system 04/12/2012  . Phlebolithiasis 04/12/2012  . Abnormal prostate by palpation 04/11/2012  . Benign neoplasm of colon 04/11/2012  . Degenerative joint disease of pelvic region 04/11/2012  . Disorder of prostate 04/11/2012  . Iron excess 04/11/2012  . Lumbosacral spondylosis without myelopathy 04/11/2012  . Nocturia 04/11/2012  . Orthostatic hypotension 04/11/2012  . 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  . Left sided sciatica 04/11/2012  . History of colonic polyps 10/03/2011    Aamori Mcmasters, PTA 10/28/2020, 2:41 PM   [facility] [address] [place], [place], [address] Phone: [phone]   Fax:  [phone]  Name: Chastin Schaible MRN: [id number] Date of Birth: Jan 19, 1946

## 2020-11-02 ENCOUNTER — Ambulatory Visit: Payer: Medicare Other

## 2020-11-02 ENCOUNTER — Other Ambulatory Visit: Payer: Self-pay

## 2020-11-02 DIAGNOSIS — M25561 Pain in right knee: Secondary | ICD-10-CM | POA: Diagnosis not present

## 2020-11-02 DIAGNOSIS — R2689 Other abnormalities of gait and mobility: Secondary | ICD-10-CM

## 2020-11-02 DIAGNOSIS — M25652 Stiffness of left hip, not elsewhere classified: Secondary | ICD-10-CM

## 2020-11-02 DIAGNOSIS — M25651 Stiffness of right hip, not elsewhere classified: Secondary | ICD-10-CM

## 2020-11-02 NOTE — Therapy (Signed)
[facility] [address], [place], [place], [address] Phone: [phone]   Fax:  [phone]  Physical Therapy Treatment  Patient Details  Name: Justin Hampton MRN: [id number] Date of Birth: June 02, 1946 Referring Provider (PT): Arn Medal, MD   Encounter Date: 11/02/2020   PT End of Session - 11/02/20 1228    Visit Number 5    Date for PT Re-Evaluation 11/23/20    Authorization Type UHC medicare    PT Start Time 1149    PT Stop Time 1228    PT Time Calculation (min) 39 min    Activity Tolerance Patient tolerated treatment well    Behavior During Therapy [facility] for tasks assessed/performed           Past Medical History:  Diagnosis Date  . Adenomatous colon polyp   . Arthritis    fingers  . 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 Cirrhosis ([place])   . Diverticulosis   . GERD (gastroesophageal reflux disease)   . 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 Hepatic encephalopathy ([place]) 04/12/2012  . Hepatitis C    treated  . Hypoglycemia   . Hypothyroidism     Past Surgical History:  Procedure Laterality Date  . APPENDECTOMY    . CARPAL TUNNEL RELEASE Bilateral   . COLONOSCOPY    . ESOPHAGOGASTRODUODENOSCOPY     x 2  . ESOPHAGOGASTRODUODENOSCOPY (EGD) WITH PROPOFOL N/A 10/29/2018   Procedure: ESOPHAGOGASTRODUODENOSCOPY (EGD) WITH PROPOFOL;  Surgeon: Rush Landmark [name], MD;  Location: [place];  Service: Gastroenterology;  Laterality: N/A;  . EUS N/A 10/29/2018   Procedure: UPPER ENDOSCOPIC ULTRASOUND (EUS) RADIAL;  Surgeon: Rush Landmark [name], MD;  Location: [place];  Service: Gastroenterology;  Laterality: N/A;    There were no vitals filed for this visit.   Subjective Assessment - 11/02/20 1151    Subjective My Rt knee is still bothering me.  It is less painful overall.  It is 50% better overall.    Currently in Pain? Yes    Pain Score 5     Pain Location Knee     Pain Orientation Right    Pain Descriptors / Indicators Sharp;Sore    Pain Type Acute pain    Pain Onset More than a month ago    Pain Frequency Intermittent    Aggravating Factors  after sitting    Pain Relieving Factors moving around                             [facility] Adult PT Treatment/Exercise - 11/02/20 0001      Knee/Hip Exercises: Stretches   Active Hamstring Stretch Both;2 reps;30 seconds    Active Hamstring Stretch Limitations tactile cues to keep knee straight      Knee/Hip Exercises: Aerobic   Nustep L2 x 8 min PT present to discuss status      Knee/Hip Exercises: Standing   Heel Raises Both;2 sets;10 reps    Rocker Board 3 minutes    Other Standing Knee Exercises standing on foam pad: weight shifting x 2 minutes      Knee/Hip Exercises: Seated   Ball Squeeze 5 sec hold 10x    Clamshell with TheraBand Yellow   10x   Marching Both;2 sets;10 reps   added yellow loop   Hamstring Curl Strengthening;Both;2 sets;20 reps    Hamstring Limitations yellow band    Sit to  Sand 2 sets;5 reps                    PT Short Term Goals - 11/02/20 1153      PT SHORT TERM GOAL #1   Title be independent in initial HEP    Status Achieved      PT SHORT TERM GOAL #2   Title report < or = to 5/10 Rt knee pain with sit to stand transition    Baseline 5/10    Status Achieved             PT Long Term Goals - 09/28/20 1111      PT LONG TERM GOAL #1   Title be independent in advanced HEP    Time 8    Period Weeks    Status New    Target Date 11/23/20      PT LONG TERM GOAL #2   Title reduce FOTO to < or = to 36% limitation    Time 8    Period Weeks    Status New    Target Date 11/23/20      PT LONG TERM GOAL #3   Title perform sit to stand with < or = to 2/10 Rt knee pain    Time 8    Period Weeks    Target Date 11/23/20      PT LONG TERM GOAL #4   Title improve LE functional strength to perform sit to stand with min to no UE support     Time 8    Period Weeks    Status New    Target Date 11/23/20                 Plan - 11/02/20 1207    Clinical Impression Statement Pt reports 50% reduction in the frequency of Rt knee pain.   Pain with Rt knee with sit to stand and then pain is not as frequent after this transition.  Pt with slow, antalgic gait and had difficulty stabilizing upon standing today. Pt tolerated more exercise in the clinic today and PT provided supervision and guarding for safety and technique. Pt will continue to benefit from skilled PT to address Rt knee pain, balance, and functional strength.    PT Frequency 1x / week    PT Duration 8 weeks    PT Treatment/Interventions ADLs/Self Care Home Management;Cryotherapy;Moist Heat;Electrical Stimulation;Stair training;Gait training;Functional mobility training;Balance training;Neuromuscular re-education;Therapeutic activities;Therapeutic exercise;Patient/family education;Manual techniques;Dry needling;Taping    PT Next Visit Plan Strength, endurance, pain management as needed    PT Home Exercise Plan Access Code: [email or web address]    Consulted and Agree with Plan of Care Patient           Patient will benefit from skilled therapeutic intervention in order to improve the following deficits and impairments:  Abnormal gait,Decreased activity tolerance,Difficulty walking,Impaired flexibility,Decreased range of motion,Postural dysfunction,Pain  Visit Diagnosis: Acute pain of right knee  Other abnormalities of gait and mobility  Stiffness of left hip, not elsewhere classified  Stiffness of right hip, not elsewhere classified     Problem List Patient Active Problem List   Diagnosis Date Noted  . Hypoglycemia 10/31/2018  . Impaired functional mobility, balance, gait, and endurance 10/31/2018  . Dilation of pancreatic duct 10/02/2018  . 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 History of  encephalopathy 09/28/2018  . Vascular dementia without behavioral disturbance ([place])   . History of  hepatitis C   . Hypothyroidism (acquired)   . Gastroesophageal reflux disease   . Aneurysm, cerebral, nonruptured   . AMS (altered mental status) 08/17/2018  . Gait disturbance 08/02/2018  . Mild memory disturbance 08/02/2018  . Hepatic encephalopathy ([place]) 07/13/2018  . 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 CKD (chronic kidney disease), stage III ([place]) 07/13/2018  . Cervical stenosis of spine 05/29/2018  . Degenerative disc disease, cervical 05/29/2018  . Iron deficiency anemia due to chronic blood loss 09/12/2017  . Rectal bleeding 06/06/2017  . Acute renal failure (ARF) ([place]) 06/06/2017  . Hyponatremia 06/06/2017  . Hyperkalemia 06/06/2017  . Anemia 06/06/2017  . GI bleeding 06/06/2017  . 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 Other dental procedure status 05/22/2017  . Family history of CHF (congestive heart failure) 02/05/2017  . Family history of sudden death 02/05/17  . Dyspnea 2016-07-24  . Other disorder of circulatory system 04/12/2012  . Phlebolithiasis 04/12/2012  . Abnormal prostate by palpation 04/11/2012  . Benign neoplasm of colon 04/11/2012  . Degenerative joint disease of pelvic region 04/11/2012  . Disorder of prostate 04/11/2012  . Iron excess 04/11/2012  . Lumbosacral spondylosis without myelopathy 04/11/2012  . Nocturia 04/11/2012  . Orthostatic hypotension 04/11/2012  . 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  . Left sided sciatica 04/11/2012  . History of colonic polyps 10/03/2011     Sigurd Sos, PT 11/02/20 12:30 PM  [facility] [address] [place], [place], [address] Phone: [phone]   Fax:  [phone]  Name: Justin Hampton MRN: [id number] Date of Birth: 21-Oct-1946

## 2020-11-04 ENCOUNTER — Ambulatory Visit: Payer: Medicare Other | Admitting: Physical Therapy

## 2020-11-04 ENCOUNTER — Other Ambulatory Visit: Payer: Self-pay

## 2020-11-04 DIAGNOSIS — M25651 Stiffness of right hip, not elsewhere classified: Secondary | ICD-10-CM

## 2020-11-04 DIAGNOSIS — M25561 Pain in right knee: Secondary | ICD-10-CM

## 2020-11-04 DIAGNOSIS — M25652 Stiffness of left hip, not elsewhere classified: Secondary | ICD-10-CM

## 2020-11-04 DIAGNOSIS — R2689 Other abnormalities of gait and mobility: Secondary | ICD-10-CM

## 2020-11-04 NOTE — Therapy (Signed)
[facility] [address] [place], [place], [address] Phone: [phone]   Fax:  [phone]  Physical Therapy Treatment  Patient Details  Name: Justin Hampton MRN: [id number] Date of Birth: October 08, 1946 Referring Provider (PT): Arn Medal, MD   Encounter Date: 11/04/2020   PT End of Session - 11/04/20 1240    Visit Number 6    Date for PT Re-Evaluation 11/23/20    Authorization Type UHC medicare    PT Start Time 1239   pt late   PT Stop Time 1310    PT Time Calculation (min) 31 min    Activity Tolerance Patient tolerated treatment well    Behavior During Therapy [facility] for tasks assessed/performed           Past Medical History:  Diagnosis Date  . Adenomatous colon polyp   . Arthritis    fingers  . 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 Cirrhosis ([place])   . Diverticulosis   . GERD (gastroesophageal reflux disease)   . 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 Hepatic encephalopathy ([place]) 04/12/2012  . Hepatitis C    treated  . Hypoglycemia   . Hypothyroidism     Past Surgical History:  Procedure Laterality Date  . APPENDECTOMY    . CARPAL TUNNEL RELEASE Bilateral   . COLONOSCOPY    . ESOPHAGOGASTRODUODENOSCOPY     x 2  . ESOPHAGOGASTRODUODENOSCOPY (EGD) WITH PROPOFOL N/A 10/29/2018   Procedure: ESOPHAGOGASTRODUODENOSCOPY (EGD) WITH PROPOFOL;  Surgeon: Rush Landmark [name], MD;  Location: [place];  Service: Gastroenterology;  Laterality: N/A;  . EUS N/A 10/29/2018   Procedure: UPPER ENDOSCOPIC ULTRASOUND (EUS) RADIAL;  Surgeon: Rush Landmark [name], MD;  Location: [place];  Service: Gastroenterology;  Laterality: N/A;    There were no vitals filed for this visit.   Subjective Assessment - 11/04/20 1241    Subjective Yesterday was a bad day, today is a good day.    Pertinent History use of cane    Diagnostic tests x-ray: OA of Rt knee    Currently in Pain?  No/denies    Multiple Pain Sites No                             [facility] Adult PT Treatment/Exercise - 11/04/20 0001      Knee/Hip Exercises: Stretches   Active Hamstring Stretch Both;2 reps;30 seconds    Active Hamstring Stretch Limitations tactile cues to keep knee straight      Knee/Hip Exercises: Aerobic   Nustep L2 x 10 min PTA present      Knee/Hip Exercises: Standing   Heel Raises Both;1 set;15 reps    Other Standing Knee Exercises standing on foam pad: weight shifting x 2 minutes      Knee/Hip Exercises: Seated   Long Arc Environmental education officer Squeeze 5 sec hold 10x    Clamshell with TheraBand --   2x15 yellow   Marching Both;2 sets;15 reps   added yellow loop   Hamstring Curl Strengthening;Both;2 sets;20 reps    Hamstring Limitations red band                    PT Short Term Goals - 11/02/20 1153      PT SHORT TERM GOAL #1   Title be independent in initial HEP    Status Achieved  PT SHORT TERM GOAL #2   Title report < or = to 5/10 Rt knee pain with sit to stand transition    Baseline 5/10    Status Achieved             PT Long Term Goals - 09/28/20 1111      PT LONG TERM GOAL #1   Title be independent in advanced HEP    Time 8    Period Weeks    Status New    Target Date 11/23/20      PT LONG TERM GOAL #2   Title reduce FOTO to < or = to 36% limitation    Time 8    Period Weeks    Status New    Target Date 11/23/20      PT LONG TERM GOAL #3   Title perform sit to stand with < or = to 2/10 Rt knee pain    Time 8    Period Weeks    Target Date 11/23/20      PT LONG TERM GOAL #4   Title improve LE functional strength to perform sit to stand with min to no UE support    Time 8    Period Weeks    Status New    Target Date 11/23/20                 Plan - 11/04/20 1244    Clinical Impression Statement Pt a litle late to appointment, took nap and just woke up. Had a bad day yesterday, pain was pretty  high but today is a good day. pt reports this is typical: good days/bad days. Exercises do not appear to increase pain.    Personal Factors and Comorbidities Age;Comorbidity 1    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    Examination-Activity Limitations Locomotion Level;Sit    Examination-Participation Restrictions Community Activity    Stability/Clinical Decision Making Stable/Uncomplicated    Rehab Potential Excellent    PT Frequency 1x / week    PT Duration 8 weeks    PT Treatment/Interventions ADLs/Self Care Home Management;Cryotherapy;Moist Heat;Electrical Stimulation;Stair training;Gait training;Functional mobility training;Balance training;Neuromuscular re-education;Therapeutic activities;Therapeutic exercise;Patient/family education;Manual techniques;Dry needling;Taping    PT Next Visit Plan Strength, endurance, pain management as needed    PT Home Exercise Plan Access Code: [email or web address]    Consulted and Agree with Plan of Care Patient           Patient will benefit from skilled therapeutic intervention in order to improve the following deficits and impairments:  Abnormal gait,Decreased activity tolerance,Difficulty walking,Impaired flexibility,Decreased range of motion,Postural dysfunction,Pain  Visit Diagnosis: Acute pain of right knee  Other abnormalities of gait and mobility  Stiffness of left hip, not elsewhere classified  Stiffness of right hip, not elsewhere classified     Problem List Patient Active Problem List   Diagnosis Date Noted  . Hypoglycemia 10/31/2018  . Impaired functional mobility, balance, gait, and endurance 10/31/2018  . Dilation of pancreatic duct 10/02/2018  . 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 History of encephalopathy 09/28/2018  . Vascular dementia without behavioral disturbance ([place])   . History of hepatitis C   . Hypothyroidism (acquired)   . Gastroesophageal reflux disease   .  Aneurysm, cerebral, nonruptured   . AMS (altered mental status) 08/17/2018  . Gait disturbance 08/02/2018  . Mild memory disturbance 08/02/2018  . Hepatic encephalopathy ([place]) 07/13/2018  . Cirrhosis of liver secondary to Hep  C ([place]) 07/13/2018  . CKD (chronic kidney disease), stage III ([place]) 07/13/2018  . Cervical stenosis of spine 05/29/2018  . Degenerative disc disease, cervical 05/29/2018  . Iron deficiency anemia due to chronic blood loss 09/12/2017  . Rectal bleeding 06/06/2017  . Acute renal failure (ARF) ([place]) 06/06/2017  . Hyponatremia 06/06/2017  . Hyperkalemia 06/06/2017  . Anemia 06/06/2017  . GI bleeding 06/06/2017  . 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 Other dental procedure status 05/22/2017  . Family history of CHF (congestive heart failure) 2017-02-10  . Family history of sudden death 2017-02-10  . Dyspnea 07/29/16  . Other disorder of circulatory system 04/12/2012  . Phlebolithiasis 04/12/2012  . Abnormal prostate by palpation 04/11/2012  . Benign neoplasm of colon 04/11/2012  . Degenerative joint disease of pelvic region 04/11/2012  . Disorder of prostate 04/11/2012  . Iron excess 04/11/2012  . Lumbosacral spondylosis without myelopathy 04/11/2012  . Nocturia 04/11/2012  . Orthostatic hypotension 04/11/2012  . 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  . Left sided sciatica 04/11/2012  . History of colonic polyps 10/03/2011    Nalla Purdy, PTA 11/04/2020, 1:01 PM  [facility] [address] [place], [place], [address] Phone: [phone]   Fax:  [phone]  Name: Dusten Ellinwood MRN: [id number] Date of Birth: 01-Feb-1946

## 2020-11-10 ENCOUNTER — Ambulatory Visit: Payer: Medicare Other | Admitting: Physical Therapy

## 2020-11-17 ENCOUNTER — Encounter: Payer: Self-pay | Admitting: Physical Therapy

## 2020-11-17 ENCOUNTER — Ambulatory Visit: Payer: Medicare Other | Admitting: Physical Therapy

## 2020-11-17 ENCOUNTER — Other Ambulatory Visit: Payer: Self-pay

## 2020-11-17 DIAGNOSIS — M25561 Pain in right knee: Secondary | ICD-10-CM

## 2020-11-17 DIAGNOSIS — M25652 Stiffness of left hip, not elsewhere classified: Secondary | ICD-10-CM

## 2020-11-17 DIAGNOSIS — M25651 Stiffness of right hip, not elsewhere classified: Secondary | ICD-10-CM

## 2020-11-17 DIAGNOSIS — R2689 Other abnormalities of gait and mobility: Secondary | ICD-10-CM

## 2020-11-17 NOTE — Therapy (Signed)
[facility] [address] [place], [place], [address] Phone: [phone]   Fax:  [phone]  Physical Therapy Treatment  Patient Details  Name: Justin Hampton MRN: [id number] Date of Birth: Aug 27, 1946 Referring Provider (PT): Arn Medal, MD   Encounter Date: 11/17/2020   PT End of Session - 11/17/20 1023    Visit Number 7    Date for PT Re-Evaluation 11/23/20    Authorization Type UHC medicare    PT Start Time 1022   Pt late   PT Stop Time 1100    PT Time Calculation (min) 38 min    Activity Tolerance Patient tolerated treatment well;Patient limited by pain    Behavior During Therapy [facility] for tasks assessed/performed           Past Medical History:  Diagnosis Date  . Adenomatous colon polyp   . Arthritis    fingers  . 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 Cirrhosis ([place])   . Diverticulosis   . GERD (gastroesophageal reflux disease)   . 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 Hepatic encephalopathy ([place]) 04/12/2012  . Hepatitis C    treated  . Hypoglycemia   . Hypothyroidism     Past Surgical History:  Procedure Laterality Date  . APPENDECTOMY    . CARPAL TUNNEL RELEASE Bilateral   . COLONOSCOPY    . ESOPHAGOGASTRODUODENOSCOPY     x 2  . ESOPHAGOGASTRODUODENOSCOPY (EGD) WITH PROPOFOL N/A 10/29/2018   Procedure: ESOPHAGOGASTRODUODENOSCOPY (EGD) WITH PROPOFOL;  Surgeon: Rush Landmark [name], MD;  Location: [place];  Service: Gastroenterology;  Laterality: N/A;  . EUS N/A 10/29/2018   Procedure: UPPER ENDOSCOPIC ULTRASOUND (EUS) RADIAL;  Surgeon: Rush Landmark [name], MD;  Location: [place];  Service: Gastroenterology;  Laterality: N/A;    There were no vitals filed for this visit.   Subjective Assessment - 11/17/20 1026    Subjective I have regressed at least 2 weeks.  I barely made it out of the grocery store on Thursday.  My knee got really painful again  middle of the week last week.  Stairs are really hard right now with my pain up.    Pertinent History use of cane    Limitations Standing;Walking;Sitting    How long can you sit comfortably? pain in Rt knee after sitting too long    Diagnostic tests x-ray: OA of Rt knee    Patient Stated Goals reduce Rt knee pain with standing and with sit to stand transition    Currently in Pain? Yes    Pain Score 9     Pain Location Knee    Pain Orientation Right    Pain Descriptors / Indicators Sore;Other (Comment)   weak   Pain Type Acute pain    Pain Onset More than a month ago    Pain Frequency Intermittent    Aggravating Factors  after sitting, walking, stairs    Pain Relieving Factors HEP helps for about 1/2 day    Effect of Pain on Daily Activities grocery shopping, stairs, transfers                             [facility] Adult PT Treatment/Exercise - 11/17/20 0001      Ambulation/Gait   Assistive device Straight cane    Gait Pattern Step-to pattern;Step-through pattern;Decreased weight shift to right;Decreased step length - right;Decreased step length - left  Pre-Gait Activities weight shifting with cue to stand tall close supervision Rt/Lt and in stagger stance x 1' each    Gait Comments parking lot to clinic and back beginning and end of session with close supervision      Exercises   Exercises Knee/Hip      Knee/Hip Exercises: Aerobic   Nustep L1 x 8' PT present to monitor, Pt reported some improvement with movement on NuStep      Knee/Hip Exercises: Seated   Other Seated Knee/Hip Exercises knee flexion and ext Rt knee, foot on slider, seated on black foam pad      Knee/Hip Exercises: Supine   Short Arc Quad Sets Right;1 set;10 reps    Heel Slides Right;10 reps      Manual Therapy   Manual Therapy Soft tissue mobilization;Passive ROM    Soft tissue mobilization Rt quad STM and [place], medial and lateral joint line Rt knee    Passive ROM Rt knee flex/ext within  pain range, Rt hip IR/ER                    PT Short Term Goals - 11/02/20 1153      PT SHORT TERM GOAL #1   Title be independent in initial HEP    Status Achieved      PT SHORT TERM GOAL #2   Title report < or = to 5/10 Rt knee pain with sit to stand transition    Baseline 5/10    Status Achieved             PT Long Term Goals - 09/28/20 1111      PT LONG TERM GOAL #1   Title be independent in advanced HEP    Time 8    Period Weeks    Status New    Target Date 11/23/20      PT LONG TERM GOAL #2   Title reduce FOTO to < or = to 36% limitation    Time 8    Period Weeks    Status New    Target Date 11/23/20      PT LONG TERM GOAL #3   Title perform sit to stand with < or = to 2/10 Rt knee pain    Time 8    Period Weeks    Target Date 11/23/20      PT LONG TERM GOAL #4   Title improve LE functional strength to perform sit to stand with min to no UE support    Time 8    Period Weeks    Status New    Target Date 11/23/20                 Plan - 11/17/20 1030    Clinical Impression Statement Pt has missed PT for 1+ weeks secondary to weather.  He arrived with signif increase in pain compared to last visit and feeling of weakness in Rt knee and anterior thigh starting mid-week last week, rated 9/10 on arrival.  He ambulates with cane, flexed trunk and limited knee extension and Rt LE WB secondary to pain and distrust of Rt knee.  PT performed STM to tight quad on Rt and performed gentle P/ROM progressing Pt to some A/ROM and light quad activation.  Pt had some improvement in gait end of session and reduced pain to 6/10. He had one episode of Rt knee giving way after sit to stand with sharp thigh pain which passed quickly.  Pt  will need close monitoring secondary to set back this week.    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    PT Frequency 1x / week    PT Duration 8 weeks    PT Treatment/Interventions ADLs/Self Care Home Management;Cryotherapy;Moist  Heat;Electrical Stimulation;Stair training;Gait training;Functional mobility training;Balance training;Neuromuscular re-education;Therapeutic activities;Therapeutic exercise;Patient/family education;Manual techniques;Dry needling;Taping    PT Next Visit Plan f/u on increased pain, gait, weight shifting, gentle STM of quads, stretching, ROM, quad activation as tol    PT Home Exercise Plan Access Code: [email or web address]    Consulted and Agree with Plan of Care Patient           Patient will benefit from skilled therapeutic intervention in order to improve the following deficits and impairments:     Visit Diagnosis: Acute pain of right knee  Other abnormalities of gait and mobility  Stiffness of left hip, not elsewhere classified  Stiffness of right hip, not elsewhere classified     Problem List Patient Active Problem List   Diagnosis Date Noted  . Hypoglycemia 10/31/2018  . Impaired functional mobility, balance, gait, and endurance 10/31/2018  . Dilation of pancreatic duct 10/02/2018  . 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 History of encephalopathy 09/28/2018  . Vascular dementia without behavioral disturbance ([place])   . History of hepatitis C   . Hypothyroidism (acquired)   . Gastroesophageal reflux disease   . Aneurysm, cerebral, nonruptured   . AMS (altered mental status) 08/17/2018  . Gait disturbance 08/02/2018  . Mild memory disturbance 08/02/2018  . Hepatic encephalopathy ([place]) 07/13/2018  . 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 CKD (chronic kidney disease), stage III ([place]) 07/13/2018  . Cervical stenosis of spine 05/29/2018  . Degenerative disc disease, cervical 05/29/2018  . Iron deficiency anemia due to chronic blood loss 09/12/2017  . Rectal bleeding 06/06/2017  . Acute renal failure (ARF) ([place]) 06/06/2017  . Hyponatremia 06/06/2017  . Hyperkalemia 06/06/2017  . Anemia 06/06/2017   . GI bleeding 06/06/2017  . 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 Other dental procedure status 05/22/2017  . Family history of CHF (congestive heart failure) 01-22-17  . Family history of sudden death 01-22-2017  . Dyspnea July 10, 2016  . Other disorder of circulatory system 04/12/2012  . Phlebolithiasis 04/12/2012  . Abnormal prostate by palpation 04/11/2012  . Benign neoplasm of colon 04/11/2012  . Degenerative joint disease of pelvic region 04/11/2012  . Disorder of prostate 04/11/2012  . Iron excess 04/11/2012  . Lumbosacral spondylosis without myelopathy 04/11/2012  . Nocturia 04/11/2012  . Orthostatic hypotension 04/11/2012  . 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  . Left sided sciatica 04/11/2012  . History of colonic polyps 10/03/2011    Baruch Merl, PT 11/17/20 1:25 PM   [facility] [address] [place], [place], [address] Phone: [phone]   Fax:  [phone]  Name: Justin Hampton MRN: [id number] Date of Birth: [birth year]

## 2020-11-20 ENCOUNTER — Other Ambulatory Visit: Payer: Self-pay

## 2020-11-20 ENCOUNTER — Ambulatory Visit: Payer: Medicare Other | Admitting: Physical Therapy

## 2020-11-20 ENCOUNTER — Encounter: Payer: Self-pay | Admitting: Physical Therapy

## 2020-11-20 DIAGNOSIS — M25561 Pain in right knee: Secondary | ICD-10-CM | POA: Diagnosis not present

## 2020-11-20 DIAGNOSIS — R2689 Other abnormalities of gait and mobility: Secondary | ICD-10-CM

## 2020-11-20 DIAGNOSIS — M25652 Stiffness of left hip, not elsewhere classified: Secondary | ICD-10-CM

## 2020-11-20 DIAGNOSIS — M25651 Stiffness of right hip, not elsewhere classified: Secondary | ICD-10-CM

## 2020-11-20 NOTE — Therapy (Signed)
[facility] [address] [place], [place], [address] Phone: [phone]   Fax:  [phone]  Physical Therapy Treatment  Patient Details  Name: Justin RuffingWilliam Hammett MRN: [id number] Date of Birth: 03/05/1946 Referring Provider (PT): Eartha InchBoyd, Lamonica, MD   Encounter Date: 11/20/2020   PT End of Session - 11/20/20 1216    Visit Number 8    Date for PT Re-Evaluation 11/23/20    Authorization Type UHC medicare    PT Start Time 1025   Pt late   PT Stop Time 1100    PT Time Calculation (min) 35 min    Activity Tolerance Patient tolerated treatment well;Patient limited by pain    Behavior [facility] for tasks assessed/performed           Past Medical History:  Diagnosis Date  . Adenomatous colon polyp   . Arthritis    fingers  . 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 Cirrhosis (HCC)   . Diverticulosis   . GERD (gastroesophageal reflux disease)   . 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HCC)    Hemophilia B  . Hepatic encephalopathy (HCC) 04/12/2012  . Hepatitis C    treated  . Hypoglycemia   . Hypothyroidism     Past Surgical History:  Procedure Laterality Date  . APPENDECTOMY    . CARPAL TUNNEL RELEASE Bilateral   . COLONOSCOPY    . ESOPHAGOGASTRODUODENOSCOPY     x 2  . ESOPHAGOGASTRODUODENOSCOPY (EGD) WITH PROPOFOL N/A 10/29/2018   Procedure: ESOPHAGOGASTRODUODENOSCOPY (EGD) WITH PROPOFOL;  Surgeon: Meridee ScoreMansouraty, Netty StarringGabriel Jr., MD;  [facility];  Service: Gastroenterology;  Laterality: N/A;  . EUS N/A 10/29/2018   Procedure: UPPER ENDOSCOPIC ULTRASOUND (EUS) RADIAL;  Surgeon: Meridee ScoreMansouraty, Netty StarringGabriel Jr., MD;  [facility];  Service: Gastroenterology;  Laterality: N/A;    There were no vitals filed for this visit.   Subjective Assessment - 11/20/20 1026    Subjective I am not much better. Ongoing pain and weakness in Rt thigh.  I am using biofreeze and Tylenol extra strength.    Pertinent  History use of cane    Limitations Standing;Walking;Sitting    How long can you sit comfortably? pain in Rt knee after sitting too long, stairs    Diagnostic tests x-ray: OA of Rt knee    Patient Stated Goals reduce Rt knee pain with standing and with sit to stand transition    Currently in Pain? Yes    Pain Score 8     Pain Location Knee    Pain Orientation Right    Pain Descriptors / Indicators Sore    Pain Type Acute pain    Pain Onset More than a month ago    Pain Frequency Intermittent                             [facility] Adult PT Treatment/Exercise - 11/20/20 0001      Exercises   Exercises Knee/Hip      Knee/Hip Exercises: Stretches   Quad Stretch Right;30 seconds    Quad Stretch Limitations passive by PT in SL    Hip Flexor Stretch Right;60 seconds    Hip Flexor Stretch Limitations passive by PT in SL    Other Knee/Hip Stretches seated clam stretch for adductors (Pt sitting with wide feet and knees) x 30 sec      Knee/Hip Exercises: Aerobic   Nustep L2 x 4' end of  session PT present to monitor pain      Knee/Hip Exercises: Seated   Long Arc Quad Right;AROM;1 set;10 reps    Clamshell with TheraBand Yellow   20 reps   Sit to Sand 1 set   improved pain with transfer from mat table     Knee/Hip Exercises: Supine   Short Arc Quad Sets Right;10 reps;AROM    Heel Slides Right;15 reps    Other Supine Knee/Hip Exercises march alt LEs x 20 reps, some Rt thigh pain with march      Manual Therapy   Manual Therapy Soft tissue mobilization    Soft tissue mobilization Rt quad from knee to hip    Passive ROM passive stretching Rt hip flexor and quad in Lt SL, gentle, prolonged hold x 60 sec                    PT Short Term Goals - 11/02/20 1153      PT SHORT TERM GOAL #1   Title be independent in initial HEP    Status Achieved      PT SHORT TERM GOAL #2   Title report < or = to 5/10 Rt knee pain with sit to stand transition    Baseline 5/10     Status Achieved             PT Long Term Goals - 09/28/20 1111      PT LONG TERM GOAL #1   Title be independent in advanced HEP    Time 8    Period Weeks    Status New    Target Date 11/23/20      PT LONG TERM GOAL #2   Title reduce FOTO to < or = to 36% limitation    Time 8    Period Weeks    Status New    Target Date 11/23/20      PT LONG TERM GOAL #3   Title perform sit to stand with < or = to 2/10 Rt knee pain    Time 8    Period Weeks    Target Date 11/23/20      PT LONG TERM GOAL #4   Title improve LE functional strength to perform sit to stand with min to no UE support    Time 8    Period Weeks    Status New    Target Date 11/23/20                 Plan - 11/20/20 1217    Clinical Impression Statement Pt continues to have a set back with increased pain and change in presentation of pain since last week after missing PT for 1+ weeks.  He has tightness and pain along Rt quad which responds well to STM.  He has activation of some anterior thigh pain with hip flexion march in supine and sitting and certainly with gait, causing him to ambulate with small step length and limited stance phase through Rt LE due to distrust of knee and quad.  He has been unable to perform complete HEP which he used to do secondary to pain.  He is due for re-eval next visit and has plans to see MD next week as well.    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    PT Frequency 1x / week    PT Duration 8 weeks    PT Treatment/Interventions ADLs/Self Care Home Management;Cryotherapy;Moist Heat;Electrical Stimulation;Stair training;Gait training;Functional mobility training;Balance training;Neuromuscular re-education;Therapeutic activities;Therapeutic exercise;Patient/family  education;Manual techniques;Dry needling;Taping    PT Next Visit Plan ERO, f/u on increased pain, gait, weight shifting, gentle STM of quads, stretching, ROM, quad activation as tol    PT Home Exercise Plan Access Code: [email or web address]     Consulted and Agree with Plan of Care Patient           Patient will benefit from skilled therapeutic intervention in order to improve the following deficits and impairments:     Visit Diagnosis: Acute pain of right knee  Other abnormalities of gait and mobility  Stiffness of left hip, not elsewhere classified  Stiffness of right hip, not elsewhere classified     Problem List Patient Active Problem List   Diagnosis Date Noted  . Hypoglycemia 10/31/2018  . Impaired functional mobility, balance, gait, and endurance 10/31/2018  . Dilation of pancreatic duct 10/02/2018  . 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 History of encephalopathy 09/28/2018  . Vascular dementia without behavioral disturbance ([place])   . History of hepatitis C   . Hypothyroidism (acquired)   . Gastroesophageal reflux disease   . Aneurysm, cerebral, nonruptured   . AMS (altered mental status) 08/17/2018  . Gait disturbance 08/02/2018  . Mild memory disturbance 08/02/2018  . Hepatic encephalopathy ([place]) 07/13/2018  . 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 CKD (chronic kidney disease), stage III ([place]) 07/13/2018  . Cervical stenosis of spine 05/29/2018  . Degenerative disc disease, cervical 05/29/2018  . Iron deficiency anemia due to chronic blood loss 09/12/2017  . Rectal bleeding 06/06/2017  . Acute renal failure (ARF) ([place]) 06/06/2017  . Hyponatremia 06/06/2017  . Hyperkalemia 06/06/2017  . Anemia 06/06/2017  . GI bleeding 06/06/2017  . 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 Other dental procedure status 05/22/2017  . Family history of CHF (congestive heart failure) 31-Jan-2017  . Family history of sudden death 01-31-2017  . Dyspnea 07-19-2016  . Other disorder of circulatory system 04/12/2012  . Phlebolithiasis 04/12/2012  . Abnormal prostate by palpation 04/11/2012  . Benign neoplasm of colon 04/11/2012  . Degenerative joint  disease of pelvic region 04/11/2012  . Disorder of prostate 04/11/2012  . Iron excess 04/11/2012  . Lumbosacral spondylosis without myelopathy 04/11/2012  . Nocturia 04/11/2012  . Orthostatic hypotension 04/11/2012  . 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  . Left sided sciatica 04/11/2012  . History of colonic polyps 10/03/2011    Alene Mires Ellarie Picking 11/20/2020, 12:21 PM  [facility] [address] [place], [place], [address] Phone: [phone]   Fax:  [phone]  Name: Dwon Sky MRN: [id number] Date of Birth: [birth year]

## 2020-11-23 ENCOUNTER — Other Ambulatory Visit: Payer: Self-pay

## 2020-11-23 ENCOUNTER — Ambulatory Visit: Payer: Medicare Other | Admitting: Physical Therapy

## 2020-11-23 DIAGNOSIS — M25561 Pain in right knee: Secondary | ICD-10-CM | POA: Diagnosis not present

## 2020-11-23 DIAGNOSIS — M25651 Stiffness of right hip, not elsewhere classified: Secondary | ICD-10-CM

## 2020-11-23 DIAGNOSIS — R2689 Other abnormalities of gait and mobility: Secondary | ICD-10-CM

## 2020-11-23 DIAGNOSIS — M25652 Stiffness of left hip, not elsewhere classified: Secondary | ICD-10-CM

## 2020-11-23 NOTE — Therapy (Signed)
[facility] [address], [place], [place], [address] Phone: [phone]   Fax:  [phone]  Physical Therapy Treatment  Patient Details  Name: Justin Hampton MRN: [id number] Date of Birth: 1946/05/24 Referring Provider (PT): Arn Medal, MD   Encounter Date: 11/23/2020   PT End of Session - 11/23/20 1103    Visit Number 9    Date for PT Re-Evaluation 11/23/20    Authorization Type UHC medicare    PT Start Time 1103    PT Stop Time 1145    PT Time Calculation (min) 42 min    Activity Tolerance Patient tolerated treatment well;Patient limited by pain    Behavior During Therapy [facility] for tasks assessed/performed           Past Medical History:  Diagnosis Date  . Adenomatous colon polyp   . Arthritis    fingers  . 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 Cirrhosis ([place])   . Diverticulosis   . GERD (gastroesophageal reflux disease)   . 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[place])    Hemophilia B  . Hepatic encephalopathy ([place]) 04/12/2012  . Hepatitis C    treated  . Hypoglycemia   . Hypothyroidism     Past Surgical History:  Procedure Laterality Date  . APPENDECTOMY    . CARPAL TUNNEL RELEASE Bilateral   . COLONOSCOPY    . ESOPHAGOGASTRODUODENOSCOPY     x 2  . ESOPHAGOGASTRODUODENOSCOPY (EGD) WITH PROPOFOL N/A 10/29/2018   Procedure: ESOPHAGOGASTRODUODENOSCOPY (EGD) WITH PROPOFOL;  Surgeon: Rush Landmark [name], MD;  Location: [place];  Service: Gastroenterology;  Laterality: N/A;  . EUS N/A 10/29/2018   Procedure: UPPER ENDOSCOPIC ULTRASOUND (EUS) RADIAL;  Surgeon: Rush Landmark [name], MD;  Location: [facility];  Service: Gastroenterology;  Laterality: N/A;    There were no vitals filed for this visit.   Subjective Assessment - 11/23/20 1103    Subjective I am about 25% better from my set back of pain.  I did have a really good period yesterday where I was able to walk around my house for 4  hours but then sat to watch football which was a mistake b/c pain returned.  Pain is less around the knee and more from hip to knee on Rt.  I see the MD on Wednesday.  Hoping for meds beyond Tylenol which only helps so much.    Pertinent History use of cane    Limitations Standing;Walking;Sitting    How long can you sit comfortably? pain in Rt knee after sitting too long, stairs    Diagnostic tests x-ray: OA of Rt knee    Patient Stated Goals reduce Rt knee pain with standing and with sit to stand transition    Currently in Pain? Yes    Pain Score 7     Pain Location Knee    Pain Orientation Right    Pain Descriptors / Indicators Sore    Pain Type Acute pain    Pain Radiating Towards from Rt lateral hip to anterior thigh    Pain Onset More than a month ago    Pain Frequency Intermittent    Aggravating Factors  after sitting, walking, stairs    Pain Relieving Factors Tylenol, HEP, not sit too long    Effect of Pain on Daily Activities grocery shopping, stairs, transfers              [facility] PT Assessment - 11/23/20 0001      Assessment  Medical Diagnosis primary OA of the Rt knee    Referring Provider (PT) Arn Medal, MD    Onset Date/Surgical Date 09/07/20    Prior Therapy none      Home Environment   Living Environment Private residence    Living Arrangements Alone    Type of [place] to enter    Home Layout Two level    [place] - 2 wheels;Cane - single point      Observation/Other Assessments   Focus on Therapeutic Outcomes (FOTO)  51% on initial intake (new scoring system), now 47% (worsened by 4 points on functional scale)      Posture/Postural Control   Posture/Postural Control Postural limitations    Postural Limitations Flexed trunk;Forward head;Rounded Shoulders      AROM   Overall AROM  Deficits    Overall AROM Comments hip IR limited to 0 degrees, ER is full.  Lt & Rt knee extension limited by 5 degrees.      PROM    Overall PROM  Deficits    Overall PROM Comments hip IR limited to 0 degrees with reproduction of Rt lateral/anterior thigh pain      Strength   Overall Strength Comments no pain on strength testing    Strength Assessment Site Knee;Hip    Right/Left Hip Right    Right Hip Flexion 4-/5    Right Hip Extension 4/5    Right Hip External Rotation  4/5    Right Hip Internal Rotation 4/5    Right Hip ABduction 4/5    Right Hip ADduction 4/5    Right/Left Knee Right    Right Knee Flexion 4/5    Right Knee Extension 4/5      Flexibility   Soft Tissue Assessment /Muscle Length yes    Hamstrings limited 50% bil    Quadriceps limited 50% on Rt, hip flexors limited 50% on right      Palpation   Palpation comment Rt quad with increased spasm and tenderness      Ambulation/Gait   Assistive device Straight cane    Gait Pattern Step-through pattern;Decreased weight shift to right;Decreased step length - left                         [facility] Adult PT Treatment/Exercise - 11/23/20 0001      Exercises   Exercises Knee/Hip      Knee/Hip Exercises: Stretches   Sports administrator Right;30 seconds    Sports administrator Limitations passive by PT in SL    Hip Flexor Stretch Right;60 seconds    Hip Flexor Stretch Limitations passive by PT in SL      Knee/Hip Exercises: Aerobic   Nustep L2 x 6' end of session, PT present and noted improved rate of work due to less pain      Knee/Hip Exercises: Standing   Gait Training within clinic to/from treatment room: PT noted improved willingness to bear weight and weight shift into Rt LE than previous 2 visits      Knee/Hip Exercises: Seated   Sit to Sand 5 reps;with UE support      Knee/Hip Exercises: Supine   Short Arc Quad Sets Right;10 reps;AROM    Other Supine Knee/Hip Exercises march alt LEs x 20 reps      Manual Therapy   Manual Therapy Soft tissue mobilization    Soft tissue mobilization Rt quad and ITB from knee to  hip    Passive ROM  passive stretching Rt hip flexor and quad in Lt SL, gentle, prolonged hold x 60 sec                    PT Short Term Goals - 11/02/20 1153      PT SHORT TERM GOAL #1   Title be independent in initial HEP    Status Achieved      PT SHORT TERM GOAL #2   Title report < or = to 5/10 Rt knee pain with sit to stand transition    Baseline 5/10    Status Achieved             PT Long Term Goals - 11/23/20 1110      PT LONG TERM GOAL #1   Title be independent in advanced HEP    Status On-going    Target Date 01/18/21      PT LONG TERM GOAL #2   Title improve FOTO to > or = 64%    Baseline baseline 51%, now 47% (worsened by 4 points)    Status Revised    Target Date 01/18/21      PT LONG TERM GOAL #3   Title perform sit to stand with < or = to 2/10 Rt knee pain    Baseline 5/10    Status On-going    Target Date 01/18/21      PT LONG TERM GOAL #4   Title improve LE functional strength to perform sit to stand with min to no UE support    Baseline still needs signif UE support    Status New    Target Date 01/18/21                 Plan - 11/23/20 1103    Clinical Impression Statement Pt had a set back approx 2 weeks ago during period of bad weather that caused him to miss 1-2 weeks' worth of PT visits.  His pain pattern had changed from isolated Rt knee pain to anterior thigh pain from hip to knee.  He has limited hip IR to 0 deg which when assessed recreates some anterior thigh pain.  He also has tender quads and ITB on Rt with TPs and tightness.  PT noted improvement this week compared to last in his transfers and quality of gait, demo'ing less need for UEs with sit to stand and improved weight shifting and weight bearing and step length of Rt LE with gait.  Pt also stated he had a good day yesterday and then worsened with prolonged sitting to watch football.  PT encouraged Pt to limited bouts of sitting to 30-60 min to reduce stiffness onset.  Pt sees MD this week  and will ask about possible involvment of Rt hip contributing to his new thigh pain.  Pt will benefit from extended PT 2x/week x 8 weeks secondary to ongoing pain, gait dysfunction, fall risk and need for improved functional strength.    Personal Factors and Comorbidities Age;Comorbidity 1    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    Examination-Activity Limitations Locomotion Level;Sit    Examination-Participation Restrictions Community Activity    Stability/Clinical Decision Making Stable/Uncomplicated    Clinical Decision Making Low    Rehab Potential Excellent    PT Frequency 2x / week    PT Duration 8 weeks    PT Treatment/Interventions ADLs/Self Care Home Management;Cryotherapy;Moist Heat;Electrical Stimulation;Stair training;Gait training;Functional mobility training;Balance training;Neuromuscular re-education;Therapeutic activities;Therapeutic exercise;Patient/family education;Manual techniques;Dry needling;Taping  PT Next Visit Plan 10th visit PN next time    PT Home Exercise Plan Access Code: [email or web address]    Recommended Other Services is initial cert signed?  re-routed 11/16/20    Consulted and Agree with Plan of Care Patient           Patient will benefit from skilled therapeutic intervention in order to improve the following deficits and impairments:  Abnormal gait,Decreased activity tolerance,Difficulty walking,Impaired flexibility,Decreased range of motion,Postural dysfunction,Pain  Visit Diagnosis: Acute pain of right knee - Plan: PT plan of care cert/re-cert  Other abnormalities of gait and mobility - Plan: PT plan of care cert/re-cert  Stiffness of left hip, not elsewhere classified - Plan: PT plan of care cert/re-cert  Stiffness of right hip, not elsewhere classified - Plan: PT plan of care cert/re-cert     Problem List Patient Active Problem List   Diagnosis Date Noted  . Hypoglycemia 10/31/2018  . Impaired functional mobility, balance, gait, and endurance 10/31/2018  .  Dilation of pancreatic duct 10/02/2018  . 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[place]) 09/28/2018  . History of encephalopathy 09/28/2018  . Vascular dementia without behavioral disturbance ([place])   . History of hepatitis C   . Hypothyroidism (acquired)   . Gastroesophageal reflux disease   . Aneurysm, cerebral, nonruptured   . AMS (altered mental status) 08/17/2018  . Gait disturbance 08/02/2018  . Mild memory disturbance 08/02/2018  . Hepatic encephalopathy ([place]) 07/13/2018  . 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 CKD (chronic kidney disease), stage III ([place]) 07/13/2018  . Cervical stenosis of spine 05/29/2018  . Degenerative disc disease, cervical 05/29/2018  . Iron deficiency anemia due to chronic blood loss 09/12/2017  . Rectal bleeding 06/06/2017  . Acute renal failure (ARF) ([place]) 06/06/2017  . Hyponatremia 06/06/2017  . Hyperkalemia 06/06/2017  . Anemia 06/06/2017  . GI bleeding 06/06/2017  . 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 Other dental procedure status 05/22/2017  . Family history of CHF (congestive heart failure) 02/01/17  . Family history of sudden death 02/01/2017  . Dyspnea 2016-07-20  . Other disorder of circulatory system 04/12/2012  . Phlebolithiasis 04/12/2012  . Abnormal prostate by palpation 04/11/2012  . Benign neoplasm of colon 04/11/2012  . Degenerative joint disease of pelvic region 04/11/2012  . Disorder of prostate 04/11/2012  . Iron excess 04/11/2012  . Lumbosacral spondylosis without myelopathy 04/11/2012  . Nocturia 04/11/2012  . Orthostatic hypotension 04/11/2012  . 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  . Left sided sciatica 04/11/2012  . History of colonic polyps 10/03/2011    Alene Mires Miquel Lamson 11/23/2020, 1:12 PM  [facility] [address], [place], [place], [address] Phone:  [phone]   Fax:  [phone]  Name: Justin Hampton MRN: [id number] Date of Birth: 03/08/1946

## 2020-11-26 ENCOUNTER — Other Ambulatory Visit: Payer: Self-pay

## 2020-11-26 ENCOUNTER — Ambulatory Visit: Payer: Medicare Other | Attending: Family Medicine

## 2020-11-26 DIAGNOSIS — M25652 Stiffness of left hip, not elsewhere classified: Secondary | ICD-10-CM | POA: Diagnosis present

## 2020-11-26 DIAGNOSIS — R2689 Other abnormalities of gait and mobility: Secondary | ICD-10-CM | POA: Diagnosis present

## 2020-11-26 DIAGNOSIS — M25561 Pain in right knee: Secondary | ICD-10-CM | POA: Insufficient documentation

## 2020-11-26 DIAGNOSIS — M25651 Stiffness of right hip, not elsewhere classified: Secondary | ICD-10-CM | POA: Insufficient documentation

## 2020-11-26 NOTE — Therapy (Signed)
[facility] [address], [place], [place], [address] Phone: [phone]   Fax:  [phone]  Physical Therapy Treatment  Patient Details  Name: Justin Hampton MRN: [id number] Date of Birth: 08-23-1946 Referring Provider (PT): Arn Medal, MD   Encounter Date: 11/26/2020 Progress Note Reporting Period 09/28/20  to 11/26/20  See note below for Objective Data and Assessment of Progress/Goals.       PT End of Session - 11/26/20 1100    Visit Number 10    Date for PT Re-Evaluation 01/18/21    Authorization Type UHC medicare    PT Start Time 1016    PT Stop Time 1100    PT Time Calculation (min) 44 min    Activity Tolerance Patient tolerated treatment well;Patient limited by pain    Behavior During Therapy WFL for tasks assessed/performed           Past Medical History:  Diagnosis Date  . Adenomatous colon polyp   . Arthritis    fingers  . 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 Cirrhosis ([place])   . Diverticulosis   . GERD (gastroesophageal reflux disease)   . 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 Hepatic encephalopathy ([place]) 04/12/2012  . Hepatitis C    treated  . Hypoglycemia   . Hypothyroidism     Past Surgical History:  Procedure Laterality Date  . APPENDECTOMY    . CARPAL TUNNEL RELEASE Bilateral   . COLONOSCOPY    . ESOPHAGOGASTRODUODENOSCOPY     x 2  . ESOPHAGOGASTRODUODENOSCOPY (EGD) WITH PROPOFOL N/A 10/29/2018   Procedure: ESOPHAGOGASTRODUODENOSCOPY (EGD) WITH PROPOFOL;  Surgeon: Rush Landmark [name], MD;  Location: [place] [facility];  Service: Gastroenterology;  Laterality: N/A;  . EUS N/A 10/29/2018   Procedure: UPPER ENDOSCOPIC ULTRASOUND (EUS) RADIAL;  Surgeon: Rush Landmark [name], MD;  Location: [facility];  Service: Gastroenterology;  Laterality: N/A;    There were no vitals filed for this visit.   Subjective Assessment - 11/26/20 1009    Subjective Pt saw  MD and had x-ray of Rt hip.  Pt reports that x-ray showed moderate OA.  Pt will see orthopedic.   MD prescribed Tramadol for pain.    Diagnostic tests x-ray: OA of Rt knee, OA of Rt hip    Patient Stated Goals reduce Rt knee pain with standing and with sit to stand transition    Currently in Pain? No/denies              [facility] PT Assessment - 11/26/20 0001      Assessment   Medical Diagnosis primary OA of the Rt knee    Referring Provider (PT) Arn Medal, MD    Onset Date/Surgical Date 09/07/20      Observation/Other Assessments   Focus on Therapeutic Outcomes (FOTO)  51% on initial intake (new scoring system), now 47% (worsened by 4 points on functional scale)      Strength   Right Hip Flexion 4-/5    Right Hip Extension 4/5    Right Hip External Rotation  4/5    Right Hip Internal Rotation 4/5    Right Hip ABduction 4/5    Right Hip ADduction 4/5    Right Knee Flexion 4/5    Right Knee Extension 4/5      Flexibility   Hamstrings limited 50% bil    Quadriceps limited 50% on Rt, hip flexors limited 50% on right  Palpation   Palpation comment Rt quad with increased spasm and tenderness      Ambulation/Gait   Assistive device Straight cane    Gait Pattern Step-through pattern;Decreased weight shift to right;Decreased step length - left                         [facility] Adult PT Treatment/Exercise - 11/26/20 0001      Knee/Hip Exercises: Stretches   Active Hamstring Stretch Both;2 reps;30 seconds      Knee/Hip Exercises: Aerobic   Nustep Level 2x 8 minutes-PT present to discuss MD visit      Knee/Hip Exercises: Standing   Rebounder weight shifting 3 ways: 1 minute each      Knee/Hip Exercises: Supine   Short Arc Quad Sets Right;10 reps;AROM      Manual Therapy   Manual Therapy Soft tissue mobilization    Manual therapy comments using roller over quads and hip flexor    Passive ROM passive stretch into IR and knee extension with gentle  prolonged hold                    PT Short Term Goals - 11/02/20 1153      PT SHORT TERM GOAL #1   Title be independent in initial HEP    Status Achieved      PT SHORT TERM GOAL #2   Title report < or = to 5/10 Rt knee pain with sit to stand transition    Baseline 5/10    Status Achieved             PT Long Term Goals - 11/26/20 1014      PT LONG TERM GOAL #1   Title be independent in advanced HEP    Status On-going      PT LONG TERM GOAL #2   Title improve FOTO to > or = 64%    Baseline baseline 51%, now 47% (worsened by 4 points)    Status On-going      PT LONG TERM GOAL #3   Title perform sit to stand with < or = to 2/10 Rt knee pain    Baseline 5/10    Status On-going      PT LONG TERM GOAL #4   Title improve LE functional strength to perform sit to stand with min to no UE support    Baseline still needs signif UE support    Status On-going                 Plan - 11/26/20 1022    Clinical Impression Statement Pt had a set back approx 2 weeks ago during period of bad weather that caused him to miss 1-2 weeks' worth of PT visits.  His pain pattern had changed from isolated Rt knee pain to anterior thigh pain from hip to knee.  He has limited hip IR to 0 deg which when assessed recreates some anterior thigh pain.  He also has tender quads and ITB on Rt with TPs and tightness.  Pt has improved this week with improved quality of movement and reduced pain.  Pt had MD visit yesterday and had x-ray of the hip and this showed OA of Rt hip.  Pt reports that he will be referred to see an orthopedic MD and was prescribed Tramadol for pain.,  Pt will benefit from extended PT 2x/week x 8 weeks secondary to ongoing pain, gait dysfunction, fall risk and  need for improved functional strength.    PT Frequency 2x / week    PT Duration 8 weeks    PT Treatment/Interventions ADLs/Self Care Home Management;Cryotherapy;Moist Heat;Electrical Stimulation;Stair training;Gait  training;Functional mobility training;Balance training;Neuromuscular re-education;Therapeutic activities;Therapeutic exercise;Patient/family education;Manual techniques;Dry needling;Taping    PT Next Visit Plan contine to advance mobility, strength, flexibility and manual/pain management as needed    PT Home Exercise Plan Access Code: [email or web address]    Consulted and Agree with Plan of Care Patient           Patient will benefit from skilled therapeutic intervention in order to improve the following deficits and impairments:  Abnormal gait,Decreased activity tolerance,Difficulty walking,Impaired flexibility,Decreased range of motion,Postural dysfunction,Pain  Visit Diagnosis: Acute pain of right knee  Other abnormalities of gait and mobility  Stiffness of left hip, not elsewhere classified  Stiffness of right hip, not elsewhere classified     Problem List Patient Active Problem List   Diagnosis Date Noted  . Hypoglycemia 10/31/2018  . Impaired functional mobility, balance, gait, and endurance 10/31/2018  . Dilation of pancreatic duct 10/02/2018  . 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 History of encephalopathy 09/28/2018  . Vascular dementia without behavioral disturbance ([place])   . History of hepatitis C   . Hypothyroidism (acquired)   . Gastroesophageal reflux disease   . Aneurysm, cerebral, nonruptured   . AMS (altered mental status) 08/17/2018  . Gait disturbance 08/02/2018  . Mild memory disturbance 08/02/2018  . Hepatic encephalopathy ([place]) 07/13/2018  . 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 CKD (chronic kidney disease), stage III ([place]) 07/13/2018  . Cervical stenosis of spine 05/29/2018  . Degenerative disc disease, cervical 05/29/2018  . Iron deficiency anemia due to chronic blood loss 09/12/2017  . Rectal bleeding 06/06/2017  . Acute renal failure (ARF) ([place]) 06/06/2017  .  Hyponatremia 06/06/2017  . Hyperkalemia 06/06/2017  . Anemia 06/06/2017  . GI bleeding 06/06/2017  . 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 Other dental procedure status 05/22/2017  . Family history of CHF (congestive heart failure) 29-Jan-2017  . Family history of sudden death Jan 29, 2017  . Dyspnea 07-17-2016  . Other disorder of circulatory system 04/12/2012  . Phlebolithiasis 04/12/2012  . Abnormal prostate by palpation 04/11/2012  . Benign neoplasm of colon 04/11/2012  . Degenerative joint disease of pelvic region 04/11/2012  . Disorder of prostate 04/11/2012  . Iron excess 04/11/2012  . Lumbosacral spondylosis without myelopathy 04/11/2012  . Nocturia 04/11/2012  . Orthostatic hypotension 04/11/2012  . 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  . Left sided sciatica 04/11/2012  . History of colonic polyps 10/03/2011     Sigurd Sos, PT 11/26/20 11:02 AM  [facility] [address], [place], [place], [address] Phone: [phone]   Fax:  [phone]  Name: Justin Hampton MRN: [id number] Date of Birth: May 31, 1946

## 2020-11-30 ENCOUNTER — Other Ambulatory Visit: Payer: Self-pay

## 2020-11-30 ENCOUNTER — Ambulatory Visit: Payer: Medicare Other

## 2020-11-30 DIAGNOSIS — M25652 Stiffness of left hip, not elsewhere classified: Secondary | ICD-10-CM

## 2020-11-30 DIAGNOSIS — R2689 Other abnormalities of gait and mobility: Secondary | ICD-10-CM

## 2020-11-30 DIAGNOSIS — M25561 Pain in right knee: Secondary | ICD-10-CM

## 2020-11-30 DIAGNOSIS — M25651 Stiffness of right hip, not elsewhere classified: Secondary | ICD-10-CM

## 2020-11-30 NOTE — Therapy (Signed)
[facility] [address] [place], [place], [address] Phone: [phone]   Fax:  [phone]  Physical Therapy Treatment  Patient Details  Name: Carolos Hampton MRN: [id number] Date of Birth: 1946-08-10 Referring Provider (PT): Arn Medal, MD   Encounter Date: 11/30/2020   PT End of Session - 11/30/20 1055    Visit Number 11    Date for PT Re-Evaluation 01/18/21    Authorization Type UHC medicare    PT Start Time 8242    PT Stop Time 1055    PT Time Calculation (min) 40 min    Activity Tolerance Patient tolerated treatment well;Patient limited by pain    Behavior During Therapy [facility] for tasks assessed/performed           Past Medical History:  Diagnosis Date  . Adenomatous colon polyp   . Arthritis    fingers  . 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 Cirrhosis ([place])   . Diverticulosis   . GERD (gastroesophageal reflux disease)   . 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 Hepatic encephalopathy ([place]) 04/12/2012  . Hepatitis C    treated  . Hypoglycemia   . Hypothyroidism     Past Surgical History:  Procedure Laterality Date  . APPENDECTOMY    . CARPAL TUNNEL RELEASE Bilateral   . COLONOSCOPY    . ESOPHAGOGASTRODUODENOSCOPY     x 2  . ESOPHAGOGASTRODUODENOSCOPY (EGD) WITH PROPOFOL N/A 10/29/2018   Procedure: ESOPHAGOGASTRODUODENOSCOPY (EGD) WITH PROPOFOL;  Surgeon: Rush Landmark [name], MD;  Location: [place];  Service: Gastroenterology;  Laterality: N/A;  . EUS N/A 10/29/2018   Procedure: UPPER ENDOSCOPIC ULTRASOUND (EUS) RADIAL;  Surgeon: Rush Landmark [name], MD;  Location: [place];  Service: Gastroenterology;  Laterality: N/A;    There were no vitals filed for this visit.   Subjective Assessment - 11/30/20 1015    Subjective I'm feeling better in the leg today.  Yesterday the pain was bad.  I am going to see an orthopedic MD today.    Currently in Pain? Yes     Pain Score 5     Pain Location Hip    Pain Orientation Right    Pain Descriptors / Indicators Sore;Aching    Pain Onset More than a month ago    Pain Frequency Intermittent    Aggravating Factors  walking, stairs, after sitting    Pain Relieving Factors Tylenol, exercises, change of position                             [facility] Adult PT Treatment/Exercise - 11/30/20 0001      Knee/Hip Exercises: Stretches   Active Hamstring Stretch Both;2 reps;30 seconds      Knee/Hip Exercises: Aerobic   Nustep Level 2x 10 minutes-PT present to discuss progress      Knee/Hip Exercises: Standing   Rebounder weight shifting 3 ways: 1 minute each      Knee/Hip Exercises: Supine   Short Arc Quad Sets Right;10 reps;AROM    Hip Adduction Isometric Strengthening;Both;2 sets;10 reps    Hip Adduction Isometric Limitations ball squeeze    Other Supine Knee/Hip Exercises march alt LEs x 20 reps    Other Supine Knee/Hip Exercises clamshell- green loop 2x10      Manual Therapy   Manual Therapy Soft tissue mobilization    Manual therapy comments using roller over quads and hip flexor  Passive ROM passive stretch into IR and knee extension with gentle prolonged hold                    PT Short Term Goals - 11/02/20 1153      PT SHORT TERM GOAL #1   Title be independent in initial HEP    Status Achieved      PT SHORT TERM GOAL #2   Title report < or = to 5/10 Rt knee pain with sit to stand transition    Baseline 5/10    Status Achieved             PT Long Term Goals - 11/26/20 1014      PT LONG TERM GOAL #1   Title be independent in advanced HEP    Status On-going      PT LONG TERM GOAL #2   Title improve FOTO to > or = 64%    Baseline baseline 51%, now 47% (worsened by 4 points)    Status On-going      PT LONG TERM GOAL #3   Title perform sit to stand with < or = to 2/10 Rt knee pain    Baseline 5/10    Status On-going      PT LONG TERM GOAL #4    Title improve LE functional strength to perform sit to stand with min to no UE support    Baseline still needs signif UE support    Status On-going                 Plan - 11/30/20 1044    Clinical Impression Statement Pt continues to report Rt hip and knee pain.  Pt reports reduced intensity of pain overall and rates the pain as 5/10 today.  Pt with continued limitation in IR and PT provided P/ROM to pt tolerance.  Pt tolerated exercise in the clinic today without increase in pain.   Pt will benefit from continued skilled PT to address ongoing pain, gait dysfunction, fall risk and need for improved functional strength.    PT Frequency 2x / week    PT Duration 8 weeks    PT Treatment/Interventions ADLs/Self Care Home Management;Cryotherapy;Moist Heat;Electrical Stimulation;Stair training;Gait training;Functional mobility training;Balance training;Neuromuscular re-education;Therapeutic activities;Therapeutic exercise;Patient/family education;Manual techniques;Dry needling;Taping    PT Next Visit Plan continue to advance mobility, strength, flexibility and manual/pain management as needed    PT Home Exercise Plan Access Code: [email or web address]    Consulted and Agree with Plan of Care Patient           Patient will benefit from skilled therapeutic intervention in order to improve the following deficits and impairments:  Abnormal gait,Decreased activity tolerance,Difficulty walking,Impaired flexibility,Decreased range of motion,Postural dysfunction,Pain  Visit Diagnosis: Acute pain of right knee  Other abnormalities of gait and mobility  Stiffness of left hip, not elsewhere classified  Stiffness of right hip, not elsewhere classified     Problem List Patient Active Problem List   Diagnosis Date Noted  . Hypoglycemia 10/31/2018  . Impaired functional mobility, balance, gait, and endurance 10/31/2018  . Dilation of pancreatic duct 10/02/2018  . Hepatitis B core antibody positive  09/28/2018  . 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 History of encephalopathy 09/28/2018  . Vascular dementia without behavioral disturbance ([place])   . History of hepatitis C   . Hypothyroidism (acquired)   . Gastroesophageal reflux disease   . Aneurysm, cerebral, nonruptured   . AMS (altered mental status) 08/17/2018  .  Gait disturbance 08/02/2018  . Mild memory disturbance 08/02/2018  . Hepatic encephalopathy ([place]) 07/13/2018  . 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 CKD (chronic kidney disease), stage III ([place]) 07/13/2018  . Cervical stenosis of spine 05/29/2018  . Degenerative disc disease, cervical 05/29/2018  . Iron deficiency anemia due to chronic blood loss 09/12/2017  . Rectal bleeding 06/06/2017  . Acute renal failure (ARF) ([place]) 06/06/2017  . Hyponatremia 06/06/2017  . Hyperkalemia 06/06/2017  . Anemia 06/06/2017  . GI bleeding 06/06/2017  . 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 Other dental procedure status 05/22/2017  . Family history of CHF (congestive heart failure) 22-Jan-2017  . Family history of sudden death 2017/01/22  . Dyspnea 07/10/2016  . Other disorder of circulatory system 04/12/2012  . Phlebolithiasis 04/12/2012  . Abnormal prostate by palpation 04/11/2012  . Benign neoplasm of colon 04/11/2012  . Degenerative joint disease of pelvic region 04/11/2012  . Disorder of prostate 04/11/2012  . Iron excess 04/11/2012  . Lumbosacral spondylosis without myelopathy 04/11/2012  . Nocturia 04/11/2012  . Orthostatic hypotension 04/11/2012  . 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  . Left sided sciatica 04/11/2012  . History of colonic polyps 10/03/2011     Sigurd Sos, PT 11/30/20 10:57 AM  [facility] [address], [place], [place], [address] Phone: [phone]   Fax:  [phone]  Name: Caitlyn Kleckner MRN:  [id number] Date of Birth: 02-Nov-1945

## 2020-12-03 ENCOUNTER — Other Ambulatory Visit: Payer: Self-pay

## 2020-12-03 ENCOUNTER — Ambulatory Visit: Payer: Medicare Other

## 2020-12-03 DIAGNOSIS — R2689 Other abnormalities of gait and mobility: Secondary | ICD-10-CM

## 2020-12-03 DIAGNOSIS — M25652 Stiffness of left hip, not elsewhere classified: Secondary | ICD-10-CM

## 2020-12-03 DIAGNOSIS — M25561 Pain in right knee: Secondary | ICD-10-CM

## 2020-12-03 DIAGNOSIS — M25651 Stiffness of right hip, not elsewhere classified: Secondary | ICD-10-CM

## 2020-12-03 NOTE — Therapy (Signed)
[facility] [address], [place], [place], [address] Phone: [phone]   Fax:  [phone]  Physical Therapy Treatment  Patient Details  Name: Justin Hampton MRN: [id number] Date of Birth: Apr 28, 1946 Referring Provider (PT): Arn Medal, MD   Encounter Date: 12/03/2020   PT End of Session - 12/03/20 1058    Visit Number 12    Date for PT Re-Evaluation 01/18/21    Authorization Type UHC medicare    PT Start Time 1017    PT Stop Time 1058    PT Time Calculation (min) 41 min    Activity Tolerance Patient tolerated treatment well;Patient limited by pain    Behavior During Therapy [facility] for tasks assessed/performed           Past Medical History:  Diagnosis Date  . Adenomatous colon polyp   . Arthritis    fingers  . 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 Cirrhosis ([place])   . Diverticulosis   . GERD (gastroesophageal reflux disease)   . 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 Hepatic encephalopathy ([place]) 04/12/2012  . Hepatitis C    treated  . Hypoglycemia   . Hypothyroidism     Past Surgical History:  Procedure Laterality Date  . APPENDECTOMY    . CARPAL TUNNEL RELEASE Bilateral   . COLONOSCOPY    . ESOPHAGOGASTRODUODENOSCOPY     x 2  . ESOPHAGOGASTRODUODENOSCOPY (EGD) WITH PROPOFOL N/A 10/29/2018   Procedure: ESOPHAGOGASTRODUODENOSCOPY (EGD) WITH PROPOFOL;  Surgeon: Rush Landmark [name], MD;  Location: [place];  Service: Gastroenterology;  Laterality: N/A;  . EUS N/A 10/29/2018   Procedure: UPPER ENDOSCOPIC ULTRASOUND (EUS) RADIAL;  Surgeon: Rush Landmark [name], MD;  Location: [place];  Service: Gastroenterology;  Laterality: N/A;    There were no vitals filed for this visit.   Subjective Assessment - 12/03/20 1022    Subjective I saw the orthopedic MD yesterday.  He said I didn't need a hip replacement and he is going to do a cortizone shot tomorrow.     Currently in Pain? Yes    Pain Score 5     Pain Location Hip    Pain Orientation Right    Pain Descriptors / Indicators Aching;Sore    Pain Type Acute pain                             [facility] Adult PT Treatment/Exercise - 12/03/20 0001      Knee/Hip Exercises: Stretches   Active Hamstring Stretch Both;2 reps;30 seconds      Knee/Hip Exercises: Aerobic   Nustep Level 2x 10 minutes-PT present to discuss progress      Knee/Hip Exercises: Standing   Rebounder weight shifting 3 ways: 1 minute each      Knee/Hip Exercises: Seated   Clamshell with TheraBand Yellow   loop 2x10   Marching Both;2 sets;15 reps    Marching Limitations holding 5# kettlebell on thigh    Hamstring Curl Strengthening;Both;2 sets;20 reps    Hamstring Limitations using yellow loop      Knee/Hip Exercises: Supine   Short Arc Quad Sets Right;10 reps;AROM    Hip Adduction Isometric Strengthening;Both;2 sets;10 reps    Hip Adduction Isometric Limitations ball squeeze      Manual Therapy   Manual Therapy Soft tissue mobilization    Manual therapy comments using roller over quads and hip flexor  PT Short Term Goals - 11/02/20 1153      PT SHORT TERM GOAL #1   Title be independent in initial HEP    Status Achieved      PT SHORT TERM GOAL #2   Title report < or = to 5/10 Rt knee pain with sit to stand transition    Baseline 5/10    Status Achieved             PT Long Term Goals - 11/26/20 1014      PT LONG TERM GOAL #1   Title be independent in advanced HEP    Status On-going      PT LONG TERM GOAL #2   Title improve FOTO to > or = 64%    Baseline baseline 51%, now 47% (worsened by 4 points)    Status On-going      PT LONG TERM GOAL #3   Title perform sit to stand with < or = to 2/10 Rt knee pain    Baseline 5/10    Status On-going      PT LONG TERM GOAL #4   Title improve LE functional strength to perform sit to stand with min to no UE support     Baseline still needs signif UE support    Status On-going                 Plan - 12/03/20 1100    Clinical Impression Statement Pt continues to report Rt hip and knee pain.  Pt reports reduced intensity of pain overall and rates the pain as 5/10 again today. Pt saw orthopedic MD and will have injection tomorrow.  Pt with continued limitation in IR and PT provided P/ROM to pt tolerance.  Pt tolerated exercise in the clinic today without increase in pain.   Pt will benefit from continued skilled PT to address ongoing pain, gait dysfunction, fall risk and need for improved functional strength.    PT Frequency 2x / week    PT Duration 8 weeks    PT Treatment/Interventions ADLs/Self Care Home Management;Cryotherapy;Moist Heat;Electrical Stimulation;Stair training;Gait training;Functional mobility training;Balance training;Neuromuscular re-education;Therapeutic activities;Therapeutic exercise;Patient/family education;Manual techniques;Dry needling;Taping    PT Next Visit Plan continue to advance mobility, strength, flexibility and manual/pain management as needed.  see how injection went    Consulted and Agree with Plan of Care Patient           Patient will benefit from skilled therapeutic intervention in order to improve the following deficits and impairments:  Abnormal gait,Decreased activity tolerance,Difficulty walking,Impaired flexibility,Decreased range of motion,Postural dysfunction,Pain  Visit Diagnosis: Acute pain of right knee  Other abnormalities of gait and mobility  Stiffness of left hip, not elsewhere classified  Stiffness of right hip, not elsewhere classified     Problem List Patient Active Problem List   Diagnosis Date Noted  . Hypoglycemia 10/31/2018  . Impaired functional mobility, balance, gait, and endurance 10/31/2018  . Dilation of pancreatic duct 10/02/2018  . 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[place])  09/28/2018  . History of encephalopathy 09/28/2018  . Vascular dementia without behavioral disturbance ([place])   . History of hepatitis C   . Hypothyroidism (acquired)   . Gastroesophageal reflux disease   . Aneurysm, cerebral, nonruptured   . AMS (altered mental status) 08/17/2018  . Gait disturbance 08/02/2018  . Mild memory disturbance 08/02/2018  . Hepatic encephalopathy ([place]) 07/13/2018  . 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 CKD (chronic kidney disease), stage III ([place]) 07/13/2018  . Cervical stenosis of spine 05/29/2018  . Degenerative disc disease, cervical 05/29/2018  . Iron deficiency anemia due to chronic blood loss 09/12/2017  . Rectal bleeding 06/06/2017  . Acute renal failure (ARF) ([place]) 06/06/2017  . Hyponatremia 06/06/2017  . Hyperkalemia 06/06/2017  . Anemia 06/06/2017  . GI bleeding 06/06/2017  . 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 Other dental procedure status 05/22/2017  . Family history of CHF (congestive heart failure) 02-07-17  . Family history of sudden death 02-07-17  . Dyspnea 26-Jul-2016  . Other disorder of circulatory system 04/12/2012  . Phlebolithiasis 04/12/2012  . Abnormal prostate by palpation 04/11/2012  . Benign neoplasm of colon 04/11/2012  . Degenerative joint disease of pelvic region 04/11/2012  . Disorder of prostate 04/11/2012  . Iron excess 04/11/2012  . Lumbosacral spondylosis without myelopathy 04/11/2012  . Nocturia 04/11/2012  . Orthostatic hypotension 04/11/2012  . 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  . Left sided sciatica 04/11/2012  . History of colonic polyps 10/03/2011    Sigurd Sos, PT 12/03/20 11:01 AM  [facility] [address], [place], [place], [address] Phone: [phone]   Fax:  [phone]  Name: Justin Hampton MRN: [id number] Date of Birth: May 13, 1946

## 2020-12-22 ENCOUNTER — Other Ambulatory Visit: Payer: Self-pay

## 2020-12-22 ENCOUNTER — Ambulatory Visit: Payer: Medicare Other | Attending: Family Medicine | Admitting: Physical Therapy

## 2020-12-22 DIAGNOSIS — R2689 Other abnormalities of gait and mobility: Secondary | ICD-10-CM | POA: Insufficient documentation

## 2020-12-22 DIAGNOSIS — M25652 Stiffness of left hip, not elsewhere classified: Secondary | ICD-10-CM | POA: Diagnosis present

## 2020-12-22 DIAGNOSIS — M25651 Stiffness of right hip, not elsewhere classified: Secondary | ICD-10-CM | POA: Diagnosis present

## 2020-12-22 DIAGNOSIS — M25561 Pain in right knee: Secondary | ICD-10-CM | POA: Diagnosis not present

## 2020-12-22 NOTE — Therapy (Signed)
[facility] [address] [place], [place], [address] Phone: [phone]   Fax:  [phone]  Physical Therapy Treatment  Patient Details  Name: Justin Hampton MRN: [id number] Date of Birth: 1946-08-24 Referring Provider (PT): Arn Medal, MD   Encounter Date: 12/22/2020   PT End of Session - 12/22/20 1746    Visit Number 13    Date for PT Re-Evaluation 01/18/21    Authorization Type UHC medicare    PT Start Time 1110   pt late   PT Stop Time 1145    PT Time Calculation (min) 35 min    Activity Tolerance Patient tolerated treatment well           Past Medical History:  Diagnosis Date  . Adenomatous colon polyp   . Arthritis    fingers  . 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 Cirrhosis ([place])   . Diverticulosis   . GERD (gastroesophageal reflux disease)   . 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 Hepatic encephalopathy ([place]) 04/12/2012  . Hepatitis C    treated  . Hypoglycemia   . Hypothyroidism     Past Surgical History:  Procedure Laterality Date  . APPENDECTOMY    . CARPAL TUNNEL RELEASE Bilateral   . COLONOSCOPY    . ESOPHAGOGASTRODUODENOSCOPY     x 2  . ESOPHAGOGASTRODUODENOSCOPY (EGD) WITH PROPOFOL N/A 10/29/2018   Procedure: ESOPHAGOGASTRODUODENOSCOPY (EGD) WITH PROPOFOL;  Surgeon: Rush Landmark [name], MD;  Location: [place];  Service: Gastroenterology;  Laterality: N/A;  . EUS N/A 10/29/2018   Procedure: UPPER ENDOSCOPIC ULTRASOUND (EUS) RADIAL;  Surgeon: Rush Landmark [name], MD;  Location: [place];  Service: Gastroenterology;  Laterality: N/A;    There were no vitals filed for this visit.   Subjective Assessment - 12/22/20 1112    Subjective Got a cortisone shot in the hip 2 weeks ago and it's improved considerably.  I haven't gone back to doing my home ex's yet.  I was late b/c I fell asleep in the lazy boy chair.    How long can you sit  comfortably? pain in Rt knee after sitting too long, stairs    Diagnostic tests x-ray: OA of Rt knee, OA of Rt hip    Patient Stated Goals reduce Rt knee pain with standing and with sit to stand transition    Currently in Pain? No/denies    Pain Score 0-No pain    Pain Location Hip    Pain Orientation Right    Pain Type Acute pain                             [facility] Adult PT Treatment/Exercise - 12/22/20 0001      Knee/Hip Exercises: Stretches   Active Hamstring Stretch Both;2 reps;30 seconds    Other Knee/Hip Stretches 2nd step hip flexor stretch on steps 10x right/left      Knee/Hip Exercises: Aerobic   Nustep Level 2x 10 minutes-PT present to discuss progress      Knee/Hip Exercises: Standing   Heel Raises Both;1 set;10 reps    Forward Step Up Right;Left;1 set;10 reps    Forward Step Up Limitations bil UE assist with railing    Rebounder --    Other Standing Knee Exercises step taps 20x with bil UE support on railings      Knee/Hip Exercises: Seated   Clamshell with TheraBand Yellow  loop 2x10   Marching Both;15 reps;1 set    Marching Limitations holding 5# kettlebell on thigh    Hamstring Curl --    Hamstring Limitations --    Sit to Sand 2 sets;5 reps   with cushion in chair                   PT Short Term Goals - 11/02/20 1153      PT SHORT TERM GOAL #1   Title be independent in initial HEP    Status Achieved      PT SHORT TERM GOAL #2   Title report < or = to 5/10 Rt knee pain with sit to stand transition    Baseline 5/10    Status Achieved             PT Long Term Goals - 11/26/20 1014      PT LONG TERM GOAL #1   Title be independent in advanced HEP    Status On-going      PT LONG TERM GOAL #2   Title improve FOTO to > or = 64%    Baseline baseline 51%, now 47% (worsened by 4 points)    Status On-going      PT LONG TERM GOAL #3   Title perform sit to stand with < or = to 2/10 Rt knee pain    Baseline 5/10    Status  On-going      PT LONG TERM GOAL #4   Title improve LE functional strength to perform sit to stand with min to no UE support    Baseline still needs signif UE support    Status On-going                 Plan - 12/22/20 1746    Clinical Impression Statement The patient returns following hip injection 2 weeks ago.  He is able to return to exercise and increase the intensity of exercise to include step ups, sit to stand and more standing.  He reports no hip or knee pain during treatment session but notes his right LE feels weaker than left.  He needs 2 short seated rest breaks during session for general fatigue.  Therapist monitoring response.    Examination-Participation Restrictions Community Activity    Rehab Potential Excellent    PT Frequency 2x / week    PT Duration 8 weeks    PT Treatment/Interventions ADLs/Self Care Home Management;Cryotherapy;Moist Heat;Electrical Stimulation;Stair training;Gait training;Functional mobility training;Balance training;Neuromuscular re-education;Therapeutic activities;Therapeutic exercise;Patient/family education;Manual techniques;Dry needling;Taping    PT Next Visit Plan continue to advance mobility, strength, flexibility and manual/pain management as needed    PT Home Exercise Plan Access Code: [email or web address]           Patient will benefit from skilled therapeutic intervention in order to improve the following deficits and impairments:  Abnormal gait,Decreased activity tolerance,Difficulty walking,Impaired flexibility,Decreased range of motion,Postural dysfunction,Pain  Visit Diagnosis: Acute pain of right knee  Other abnormalities of gait and mobility  Stiffness of left hip, not elsewhere classified  Stiffness of right hip, not elsewhere classified     Problem List Patient Active Problem List   Diagnosis Date Noted  . Hypoglycemia 10/31/2018  . Impaired functional mobility, balance, gait, and endurance 10/31/2018  . Dilation of  pancreatic duct 10/02/2018  . 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 History of encephalopathy 09/28/2018  . Vascular dementia without behavioral disturbance ([place])   .  History of hepatitis C   . Hypothyroidism (acquired)   . Gastroesophageal reflux disease   . Aneurysm, cerebral, nonruptured   . AMS (altered mental status) 08/17/2018  . Gait disturbance 08/02/2018  . Mild memory disturbance 08/02/2018  . Hepatic encephalopathy ([place]) 07/13/2018  . 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 CKD (chronic kidney disease), stage III ([place]) 07/13/2018  . Cervical stenosis of spine 05/29/2018  . Degenerative disc disease, cervical 05/29/2018  . Iron deficiency anemia due to chronic blood loss 09/12/2017  . Rectal bleeding 06/06/2017  . Acute renal failure (ARF) ([place]) 06/06/2017  . Hyponatremia 06/06/2017  . Hyperkalemia 06/06/2017  . Anemia 06/06/2017  . GI bleeding 06/06/2017  . 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 Other dental procedure status 05/22/2017  . Family history of CHF (congestive heart failure) 2017/02/06  . Family history of sudden death 02-06-17  . Dyspnea 2016-07-25  . Other disorder of circulatory system 04/12/2012  . Phlebolithiasis 04/12/2012  . Abnormal prostate by palpation 04/11/2012  . Benign neoplasm of colon 04/11/2012  . Degenerative joint disease of pelvic region 04/11/2012  . Disorder of prostate 04/11/2012  . Iron excess 04/11/2012  . Lumbosacral spondylosis without myelopathy 04/11/2012  . Nocturia 04/11/2012  . Orthostatic hypotension 04/11/2012  . 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  . Left sided sciatica 04/11/2012  . History of colonic polyps 10/03/2011   Ruben Im, PT 12/22/20 5:52 PM Phone: [phone] Fax: [phone] Alvera Singh 12/22/2020, 5:52 PM  [facility] [address], [place], [place], [address] Phone: [phone]   Fax:  [phone]  Name: Khyle Goodell MRN: [id number] Date of Birth: [birth year]

## 2020-12-29 ENCOUNTER — Ambulatory Visit: Payer: Medicare Other | Admitting: Physical Therapy

## 2020-12-29 ENCOUNTER — Other Ambulatory Visit: Payer: Self-pay

## 2020-12-29 DIAGNOSIS — R2689 Other abnormalities of gait and mobility: Secondary | ICD-10-CM

## 2020-12-29 DIAGNOSIS — M25651 Stiffness of right hip, not elsewhere classified: Secondary | ICD-10-CM

## 2020-12-29 DIAGNOSIS — M25561 Pain in right knee: Secondary | ICD-10-CM

## 2020-12-29 DIAGNOSIS — M25652 Stiffness of left hip, not elsewhere classified: Secondary | ICD-10-CM

## 2020-12-29 NOTE — Therapy (Addendum)
[facility] [address] [place], [place], [address] Phone: [phone]   Fax:  [phone]  Physical Therapy Treatment  Patient Details  Name: Justin Hampton MRN: [id number] Date of Birth: [birth year] Referring Provider (PT): Arn Medal, MD   Encounter Date: 12/29/2020   PT End of Session - 12/29/20 1113    Visit Number 14    Date for PT Re-Evaluation 01/18/21    Authorization Type UHC medicare    PT Start Time 1053    PT Stop Time 1138    PT Time Calculation (min) 45 min    Activity Tolerance Patient tolerated treatment well    Behavior During Therapy [facility] for tasks assessed/performed           Past Medical History:  Diagnosis Date  . Adenomatous colon polyp   . Arthritis    fingers  . 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 Cirrhosis ([place])   . Diverticulosis   . GERD (gastroesophageal reflux disease)   . 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 Hepatic encephalopathy ([place]) 04/12/2012  . Hepatitis C    treated  . Hypoglycemia   . Hypothyroidism     Past Surgical History:  Procedure Laterality Date  . APPENDECTOMY    . CARPAL TUNNEL RELEASE Bilateral   . COLONOSCOPY    . ESOPHAGOGASTRODUODENOSCOPY     x 2  . ESOPHAGOGASTRODUODENOSCOPY (EGD) WITH PROPOFOL N/A 10/29/2018   Procedure: ESOPHAGOGASTRODUODENOSCOPY (EGD) WITH PROPOFOL;  Surgeon: Rush Landmark [name], MD;  Location: [place];  Service: Gastroenterology;  Laterality: N/A;  . EUS N/A 10/29/2018   Procedure: UPPER ENDOSCOPIC ULTRASOUND (EUS) RADIAL;  Surgeon: Rush Landmark [name], MD;  Location: [place];  Service: Gastroenterology;  Laterality: N/A;    There were no vitals filed for this visit.   Subjective Assessment - 12/29/20 1103    Subjective Patient reports stiffness upon immediately waking up in the morning. Reports using SPC. Reports decreased decreased R LE pain. Feels that injection was helpful     Pertinent History use of cane    Limitations Standing;Walking;Sitting    Diagnostic tests x-ray: OA of Rt knee, OA of Rt hip    Patient Stated Goals reduce Rt knee pain with standing and with sit to stand transition    Currently in Pain? Yes    Pain Score 5     Pain Location Hip    Pain Orientation Right    Pain Type Acute pain    Pain Radiating Towards R anterior thigh to R knee    Pain Frequency Intermittent    Aggravating Factors  stairs    Pain Relieving Factors rest, heat    Multiple Pain Sites No                             [facility] Adult PT Treatment/Exercise - 12/29/20 0001      Knee/Hip Exercises: Stretches   Active Hamstring Stretch Right;2 reps;30 seconds      Knee/Hip Exercises: Aerobic   Nustep Level 2x 10 minutes-PT present to discuss current functional limitations and patient progression      Knee/Hip Exercises: Standing   Rebounder weight shifting 3 ways: 1 minute each   SBA for safety; CGA to transition down   Other Standing Knee Exercises step taps 20x with bil UE support on railings   VC for decreased hand support; SBA for safety; VC  for erect trunk     Knee/Hip Exercises: Seated   Marching 10 reps;2 sets;Right   increased hip ER   Marching Limitations holding 5# kettlebell on thigh    Abduction/Adduction  Strengthening;Both;2 sets;10 reps    Abd/Adduction Limitations using yellow loop    Sit to Sand 2 sets;5 reps   black foam on mat table; VC for controlled lowering and foot position; CGA for safety     Ankle Exercises: Standing   Rocker Board 2 minutes   2 hand hold; CGA for safety                   PT Short Term Goals - 11/02/20 1153      PT SHORT TERM GOAL #1   Title be independent in initial HEP    Status Achieved      PT SHORT TERM GOAL #2   Title report < or = to 5/10 Rt knee pain with sit to stand transition    Baseline 5/10    Status Achieved             PT Long Term Goals - 11/26/20 1014      PT LONG  TERM GOAL #1   Title be independent in advanced HEP    Status On-going      PT LONG TERM GOAL #2   Title improve FOTO to > or = 64%    Baseline baseline 51%, now 47% (worsened by 4 points)    Status On-going      PT LONG TERM GOAL #3   Title perform sit to stand with < or = to 2/10 Rt knee pain    Baseline 5/10    Status On-going      PT LONG TERM GOAL #4   Title improve LE functional strength to perform sit to stand with min to no UE support    Baseline still needs signif UE support    Status On-going                 Plan - 12/29/20 1105    Clinical Impression Statement Patient reports that R LE pain has decreased and only occurs intermittently when transitioning to weightbearing on R LE. Feels that mobility has improved. Continues to demonstrate significant R hip IR limitations. Noting functional mobility impairments as patient having increased difficulty with controlled descent during sit to stand activity. Patient reports increased resistance in hip joint when performing seated hip flexion against resistance. Patient continues to require intermittent cuing for decreased thoracic kyphosis and to increase visual scanning of environment as patient tends to ambulate while looking down at floor. Would benefit from continued skilled intervention to address impairments for decreased pain and improved functional activity tolerance.    Personal Factors and Comorbidities Age;Comorbidity 1    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    Examination-Activity Limitations Locomotion Level;Sit    Examination-Participation Restrictions Community Activity    Stability/Clinical Decision Making Stable/Uncomplicated    Rehab Potential Excellent    PT Frequency 2x / week    PT Duration 8 weeks    PT Treatment/Interventions ADLs/Self Care Home Management;Cryotherapy;Moist Heat;Electrical Stimulation;Stair training;Gait training;Functional mobility training;Balance training;Neuromuscular re-education;Therapeutic  activities;Therapeutic exercise;Patient/family education;Manual techniques;Dry needling;Taping    PT Next Visit Plan continue functional mobility, strength, and flexibility progessions; pain management as needed    PT Home Exercise Plan Access Code: [email or web address]    Consulted and Agree with Plan of Care Patient           Patient  will benefit from skilled therapeutic intervention in order to improve the following deficits and impairments:  Abnormal gait,Decreased activity tolerance,Difficulty walking,Impaired flexibility,Decreased range of motion,Postural dysfunction,Pain  Visit Diagnosis: Acute pain of right knee  Other abnormalities of gait and mobility  Stiffness of left hip, not elsewhere classified  Stiffness of right hip, not elsewhere classified     Problem List Patient Active Problem List   Diagnosis Date Noted  . Hypoglycemia 10/31/2018  . Impaired functional mobility, balance, gait, and endurance 10/31/2018  . Dilation of pancreatic duct 10/02/2018  . 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 History of encephalopathy 09/28/2018  . Vascular dementia without behavioral disturbance ([place])   . History of hepatitis C   . Hypothyroidism (acquired)   . Gastroesophageal reflux disease   . Aneurysm, cerebral, nonruptured   . AMS (altered mental status) 08/17/2018  . Gait disturbance 08/02/2018  . Mild memory disturbance 08/02/2018  . Hepatic encephalopathy ([place]) 07/13/2018  . 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 CKD (chronic kidney disease), stage III ([place]) 07/13/2018  . Cervical stenosis of spine 05/29/2018  . Degenerative disc disease, cervical 05/29/2018  . Iron deficiency anemia due to chronic blood loss 09/12/2017  . Rectal bleeding 06/06/2017  . Acute renal failure (ARF) ([place]) 06/06/2017  . Hyponatremia 06/06/2017  . Hyperkalemia 06/06/2017  . Anemia 06/06/2017  . GI bleeding  06/06/2017  . 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 Other dental procedure status 05/22/2017  . Family history of CHF (congestive heart failure) 02/18/17  . Family history of sudden death Feb 18, 2017  . Dyspnea 08/06/16  . Other disorder of circulatory system 04/12/2012  . Phlebolithiasis 04/12/2012  . Abnormal prostate by palpation 04/11/2012  . Benign neoplasm of colon 04/11/2012  . Degenerative joint disease of pelvic region 04/11/2012  . Disorder of prostate 04/11/2012  . Iron excess 04/11/2012  . Lumbosacral spondylosis without myelopathy 04/11/2012  . Nocturia 04/11/2012  . Orthostatic hypotension 04/11/2012  . 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  . Left sided sciatica 04/11/2012  . History of colonic polyps 10/03/2011   Everardo All PT, DPT  12/29/20 12:21 PM  [facility] [address] [place], [place], [address] Phone: [phone]   Fax:  [phone]  Name: Justin Hampton MRN: [id number] Date of Birth: 10/13/1946

## 2021-01-04 ENCOUNTER — Other Ambulatory Visit: Payer: Self-pay

## 2021-01-04 ENCOUNTER — Ambulatory Visit: Payer: Medicare Other | Admitting: Physical Therapy

## 2021-01-04 ENCOUNTER — Encounter: Payer: Self-pay | Admitting: Physical Therapy

## 2021-01-04 DIAGNOSIS — M25651 Stiffness of right hip, not elsewhere classified: Secondary | ICD-10-CM

## 2021-01-04 DIAGNOSIS — R2689 Other abnormalities of gait and mobility: Secondary | ICD-10-CM

## 2021-01-04 DIAGNOSIS — M25561 Pain in right knee: Secondary | ICD-10-CM | POA: Diagnosis not present

## 2021-01-04 NOTE — Therapy (Addendum)
[facility] [address] [place], [place], [address] Phone: [phone]   Fax:  [phone]  Physical Therapy Treatment  Patient Details  Name: Justin Hampton MRN: [id number] Date of Birth: 1946/04/14 Referring Provider (PT): Arn Medal, MD   Encounter Date: 01/04/2021   PT End of Session - 01/04/21 1200    Visit Number 15    Date for PT Re-Evaluation 01/18/21    Authorization Type UHC medicare    PT Start Time 1150    PT Stop Time 1228    PT Time Calculation (min) 38 min    Activity Tolerance Patient tolerated treatment well    Behavior During Therapy [facility] for tasks assessed/performed           Past Medical History:  Diagnosis Date  . Adenomatous colon polyp   . Arthritis    fingers  . 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 Cirrhosis ([place])   . Diverticulosis   . GERD (gastroesophageal reflux disease)   . 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 Hepatic encephalopathy ([place]) 04/12/2012  . Hepatitis C    treated  . Hypoglycemia   . Hypothyroidism     Past Surgical History:  Procedure Laterality Date  . APPENDECTOMY    . CARPAL TUNNEL RELEASE Bilateral   . COLONOSCOPY    . ESOPHAGOGASTRODUODENOSCOPY     x 2  . ESOPHAGOGASTRODUODENOSCOPY (EGD) WITH PROPOFOL N/A 10/29/2018   Procedure: ESOPHAGOGASTRODUODENOSCOPY (EGD) WITH PROPOFOL;  Surgeon: Rush Landmark [name], MD;  Location: [place];  Service: Gastroenterology;  Laterality: N/A;  . EUS N/A 10/29/2018   Procedure: UPPER ENDOSCOPIC ULTRASOUND (EUS) RADIAL;  Surgeon: Rush Landmark [name], MD;  Location: [place];  Service: Gastroenterology;  Laterality: N/A;    There were no vitals filed for this visit.   Subjective Assessment - 01/04/21 1155    Subjective Rt hip pain is about 85% better since the injection.  I mostly notice it when getting out of bed.  The knee hasn't had much pain lately.  I carry my cane but  don't feel like I need it too much.    Limitations Standing;Walking;Sitting    How long can you sit comfortably? pain in Rt knee after sitting too long, stairs    Diagnostic tests x-ray: OA of Rt knee, OA of Rt hip    Patient Stated Goals reduce Rt knee pain with standing and with sit to stand transition    Currently in Pain? Yes    Pain Score 2     Pain Location Knee    Pain Orientation Right    Pain Descriptors / Indicators Aching;Sore    Pain Type Chronic pain    Pain Onset More than a month ago    Pain Frequency Intermittent    Aggravating Factors  stairs    Pain Relieving Factors rest, heat    Effect of Pain on Daily Activities grocery shopping, stairs, transfers              [facility] PT Assessment - 01/04/21 0001      Observation/Other Assessments   Focus on Therapeutic Outcomes (FOTO)  53%, improved from 51% at eval and from more recent regressed score of 47%                         [facility] Adult PT Treatment/Exercise - 01/04/21 0001      Knee/Hip Exercises: Aerobic  Nustep L2 x 10' PT present to discuss current status and pain      Knee/Hip Exercises: Standing   Heel Raises Both;1 set;20 reps    Heel Raises Limitations UE support    Rocker Board 2 minutes   bil UE support, VC to use trunk weight shift and ankle control vs UEs to generate rocking on board   SLS 1x10 sec with foot on 6" step, bil UE support, TC/VC to stand tall to engage glut on stance leg    Other Standing Knee Exercises march taps to 1st step bil UE support x 20      Knee/Hip Exercises: Seated   Marching Strengthening;2 sets;10 reps;Weights;Both    Marching Limitations holding 5# kettlebell on thigh    Abduction/Adduction  Strengthening    Abd/Adduction Limitations yellow loop seated clam x 30    Sit to Sand 2 sets;5 reps;with UE support   PT TC/VC to keep COG forward and engage gluts                   PT Short Term Goals - 01/04/21 1157      PT SHORT TERM GOAL #1    Title be independent in initial HEP    Status Achieved      PT SHORT TERM GOAL #2   Title report < or = to 5/10 Rt knee pain with sit to stand transition    Status Achieved             PT Long Term Goals - 01/04/21 1157      PT LONG TERM GOAL #1   Title be independent in advanced HEP    Status On-going      PT LONG TERM GOAL #2   Title improve FOTO to > or = 64%    Baseline 53% on 01/04/21      PT LONG TERM GOAL #3   Title perform sit to stand with < or = to 2/10 Rt knee pain    Status Achieved      PT LONG TERM GOAL #4   Title improve LE functional strength to perform sit to stand with min to no UE support    Baseline with hands on thighs and VC/TC for COG forward, 50% success rate for ind sit to stand from chair    Status On-going                 Plan - 01/04/21 1225    Clinical Impression Statement Pt reports 85% reduction in Rt hip pain since injection and 2/10 pain experience of Rt knee lately.  He is able to perform short distance ambulation without use of cane but uses it for reassurance for balance.  He ambulates with short stride but improved Rt hip advancement and symmetrical weight shifting as compared to prior to hip injection.  PT gave VC/TC for hands on thighs vs chair arm rests with sit to stand for improved COG shift over feet and exaggerated hip hinge for successful sit to stand.  He was able to perform this with intermittent success, about 50% of trials.  He grows fatigued with standing ther ex so intermittent seated breaks or ther ex sprinkled in between standing exercises today.  Pt will continue to benefit from skilled PT for functional strength, balance, gait and pain control as needed.    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    PT Frequency 2x / week    PT Duration 8 weeks  PT Treatment/Interventions ADLs/Self Care Home Management;Cryotherapy;Moist Heat;Electrical Stimulation;Stair training;Gait training;Functional mobility training;Balance  training;Neuromuscular re-education;Therapeutic activities;Therapeutic exercise;Patient/family education;Manual techniques;Dry needling;Taping    PT Next Visit Plan discuss plan to d/c or extend for ERO in 2 visits, work on sit to stand with hands on thighs and COG forward, seated/standing ther ex for functional strength, gait w/ and w/o cane, NuStep L2x10    PT Home Exercise Plan Access Code: [email or web address]    Recommended Other Services check certs for signature    Consulted and Agree with Plan of Care Patient           Patient will benefit from skilled therapeutic intervention in order to improve the following deficits and impairments:     Visit Diagnosis: Acute pain of right knee  Other abnormalities of gait and mobility  Stiffness of right hip, not elsewhere classified     Problem List Patient Active Problem List   Diagnosis Date Noted  . Hypoglycemia 10/31/2018  . Impaired functional mobility, balance, gait, and endurance 10/31/2018  . Dilation of pancreatic duct 10/02/2018  . 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 History of encephalopathy 09/28/2018  . Vascular dementia without behavioral disturbance ([place])   . History of hepatitis C   . Hypothyroidism (acquired)   . Gastroesophageal reflux disease   . Aneurysm, cerebral, nonruptured   . AMS (altered mental status) 08/17/2018  . Gait disturbance 08/02/2018  . Mild memory disturbance 08/02/2018  . Hepatic encephalopathy ([place]) 07/13/2018  . 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 CKD (chronic kidney disease), stage III ([place]) 07/13/2018  . Cervical stenosis of spine 05/29/2018  . Degenerative disc disease, cervical 05/29/2018  . Iron deficiency anemia due to chronic blood loss 09/12/2017  . Rectal bleeding 06/06/2017  . Acute renal failure (ARF) ([place]) 06/06/2017  . Hyponatremia 06/06/2017  . Hyperkalemia 06/06/2017  . Anemia 06/06/2017  .  GI bleeding 06/06/2017  . 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 Other dental procedure status 05/22/2017  . Family history of CHF (congestive heart failure) 2017-01-22  . Family history of sudden death January 22, 2017  . Dyspnea 07-10-2016  . Other disorder of circulatory system 04/12/2012  . Phlebolithiasis 04/12/2012  . Abnormal prostate by palpation 04/11/2012  . Benign neoplasm of colon 04/11/2012  . Degenerative joint disease of pelvic region 04/11/2012  . Disorder of prostate 04/11/2012  . Iron excess 04/11/2012  . Lumbosacral spondylosis without myelopathy 04/11/2012  . Nocturia 04/11/2012  . Orthostatic hypotension 04/11/2012  . 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  . Left sided sciatica 04/11/2012  . History of colonic polyps 10/03/2011    Alene Mires Ashlan Dignan 01/04/2021, 12:48 PM  [facility] [address], [place], [place], [address] Phone: [phone]   Fax:  [phone]  Name: Justin Hampton MRN: [id number] Date of Birth: 08-06-1946

## 2021-01-11 ENCOUNTER — Ambulatory Visit: Payer: Medicare Other

## 2021-01-11 ENCOUNTER — Other Ambulatory Visit: Payer: Self-pay

## 2021-01-11 DIAGNOSIS — M25561 Pain in right knee: Secondary | ICD-10-CM | POA: Diagnosis not present

## 2021-01-11 DIAGNOSIS — R2689 Other abnormalities of gait and mobility: Secondary | ICD-10-CM

## 2021-01-11 DIAGNOSIS — M25651 Stiffness of right hip, not elsewhere classified: Secondary | ICD-10-CM

## 2021-01-11 DIAGNOSIS — M25652 Stiffness of left hip, not elsewhere classified: Secondary | ICD-10-CM

## 2021-01-11 NOTE — Therapy (Signed)
[facility] [address], [place], [place], [address] Phone: [phone]   Fax:  [phone]  Physical Therapy Treatment  Patient Details  Name: Justin Hampton MRN: [id number] Date of Birth: 1946-03-21 Referring Provider (PT): Arn Medal, MD   Encounter Date: 01/11/2021   PT End of Session - 01/11/21 1613    Visit Number 16    Date for PT Re-Evaluation 01/18/21    Authorization Type UHC medicare    Progress Note Due on Visit 20    PT Start Time 1324    PT Stop Time 1611    PT Time Calculation (min) 41 min    Activity Tolerance Patient tolerated treatment well    Behavior During Therapy [facility] for tasks assessed/performed           Past Medical History:  Diagnosis Date  . Adenomatous colon polyp   . Arthritis    fingers  . 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 Cirrhosis ([place])   . Diverticulosis   . GERD (gastroesophageal reflux disease)   . 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 Hepatic encephalopathy ([place]) 04/12/2012  . Hepatitis C    treated  . Hypoglycemia   . Hypothyroidism     Past Surgical History:  Procedure Laterality Date  . APPENDECTOMY    . CARPAL TUNNEL RELEASE Bilateral   . COLONOSCOPY    . ESOPHAGOGASTRODUODENOSCOPY     x 2  . ESOPHAGOGASTRODUODENOSCOPY (EGD) WITH PROPOFOL N/A 10/29/2018   Procedure: ESOPHAGOGASTRODUODENOSCOPY (EGD) WITH PROPOFOL;  Surgeon: Rush Landmark [name], MD;  Location: [facility];  Service: Gastroenterology;  Laterality: N/A;  . EUS N/A 10/29/2018   Procedure: UPPER ENDOSCOPIC ULTRASOUND (EUS) RADIAL;  Surgeon: Rush Landmark [name], MD;  Location: [facility];  Service: Gastroenterology;  Laterality: N/A;    There were no vitals filed for this visit.   Subjective Assessment - 01/11/21 1538    Subjective I am doing much better.  I walk with a cane when outside the house and I don't use the cane.    Patient Stated Goals reduce  Rt knee pain with standing and with sit to stand transition    Currently in Pain? No/denies                             [facility] Adult PT Treatment/Exercise - 01/11/21 0001      Knee/Hip Exercises: Stretches   Active Hamstring Stretch Right;2 reps;30 seconds      Knee/Hip Exercises: Aerobic   Nustep L2 x 10' PT present to discuss current status and pain      Knee/Hip Exercises: Standing   Heel Raises Both;1 set;20 reps    Heel Raises Limitations UE support    Rocker Board 2 minutes   bil UE support, VC to use trunk weight shift and ankle control vs UEs to generate rocking on board   Rebounder --    Other Standing Knee Exercises march taps to 1st step bil UE support x 20- standing on foam pad      Knee/Hip Exercises: Seated   Marching Strengthening;2 sets;10 reps;Weights;Both    Marching Limitations holding 5# kettlebell on thigh    Abduction/Adduction  Strengthening    Abd/Adduction Limitations yellow loop seated clam x 30    Sit to Sand 2 sets;5 reps;with UE support   cues for weight shift and Rt=Lt weightbearing  PT Short Term Goals - 01/04/21 1157      PT SHORT TERM GOAL #1   Title be independent in initial HEP    Status Achieved      PT SHORT TERM GOAL #2   Title report < or = to 5/10 Rt knee pain with sit to stand transition    Status Achieved             PT Long Term Goals - 01/11/21 1540      PT LONG TERM GOAL #2   Title improve FOTO to > or = 64%    Baseline 53% on 01/04/21    Status On-going      PT LONG TERM GOAL #3   Title perform sit to stand with < or = to 2/10 Rt knee pain    Status Achieved                 Plan - 01/11/21 1549    Clinical Impression Statement Pt continues to report 85% reduction in Rt hip pain since injection hasn't had pain over the past few days.   He is able to perform short distance ambulation without use of cane but uses it for reassurance for balance.  Pt reports that he  limits his ambulation to short distances and is trying to increase this as able.  Pt is independent and compliant in HEP for strength, flexibility and endurance.  Pt requires minor tactile and verbal cues for technique with exercise and close supervision due to intermittent instability with activity.  Pt will likely D/C to HEP next visit.    PT Frequency 2x / week    PT Duration 8 weeks    PT Treatment/Interventions ADLs/Self Care Home Management;Cryotherapy;Moist Heat;Electrical Stimulation;Stair training;Gait training;Functional mobility training;Balance training;Neuromuscular re-education;Therapeutic activities;Therapeutic exercise;Patient/family education;Manual techniques;Dry needling;Taping    PT Next Visit Plan probable D/C to HEP.  Finalize HEP and final goal assessment.    PT Home Exercise Plan Access Code: [email or web address]    Consulted and Agree with Plan of Care Patient           Patient will benefit from skilled therapeutic intervention in order to improve the following deficits and impairments:  Abnormal gait,Decreased activity tolerance,Difficulty walking,Impaired flexibility,Decreased range of motion,Postural dysfunction,Pain  Visit Diagnosis: Acute pain of right knee  Other abnormalities of gait and mobility  Stiffness of right hip, not elsewhere classified  Stiffness of left hip, not elsewhere classified     Problem List Patient Active Problem List   Diagnosis Date Noted  . Hypoglycemia 10/31/2018  . Impaired functional mobility, balance, gait, and endurance 10/31/2018  . Dilation of pancreatic duct 10/02/2018  . 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[place]) 09/28/2018  . History of encephalopathy 09/28/2018  . Vascular dementia without behavioral disturbance ([place])   . History of hepatitis C   . Hypothyroidism (acquired)   . Gastroesophageal reflux disease   . Aneurysm, cerebral, nonruptured   . AMS (altered mental status)  08/17/2018  . Gait disturbance 08/02/2018  . Mild memory disturbance 08/02/2018  . Hepatic encephalopathy ([place]) 07/13/2018  . 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 CKD (chronic kidney disease), stage III ([place]) 07/13/2018  . Cervical stenosis of spine 05/29/2018  . Degenerative disc disease, cervical 05/29/2018  . Iron deficiency anemia due to chronic blood loss 09/12/2017  . Rectal bleeding 06/06/2017  . Acute renal failure (ARF) ([place]) 06/06/2017  . Hyponatremia 06/06/2017  .  Hyperkalemia 06/06/2017  . Anemia 06/06/2017  . GI bleeding 06/06/2017  . 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 Other dental procedure status 05/22/2017  . Family history of CHF (congestive heart failure) 02-05-2017  . Family history of sudden death 2017/02/05  . Dyspnea 07-24-16  . Other disorder of circulatory system 04/12/2012  . Phlebolithiasis 04/12/2012  . Abnormal prostate by palpation 04/11/2012  . Benign neoplasm of colon 04/11/2012  . Degenerative joint disease of pelvic region 04/11/2012  . Disorder of prostate 04/11/2012  . Iron excess 04/11/2012  . Lumbosacral spondylosis without myelopathy 04/11/2012  . Nocturia 04/11/2012  . Orthostatic hypotension 04/11/2012  . 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  . Left sided sciatica 04/11/2012  . History of colonic polyps 10/03/2011     Sigurd Sos, PT 01/11/21 4:14 PM  [facility] [address] [place], [place], [address] Phone: [phone]   Fax:  [phone]  Name: Justin Hampton MRN: [id number] Date of Birth: 1946/09/03

## 2021-01-18 ENCOUNTER — Other Ambulatory Visit: Payer: Self-pay

## 2021-01-18 ENCOUNTER — Encounter: Payer: Self-pay | Admitting: Physical Therapy

## 2021-01-18 ENCOUNTER — Ambulatory Visit: Payer: Medicare Other | Admitting: Physical Therapy

## 2021-01-18 DIAGNOSIS — M25561 Pain in right knee: Secondary | ICD-10-CM | POA: Diagnosis not present

## 2021-01-18 DIAGNOSIS — M25651 Stiffness of right hip, not elsewhere classified: Secondary | ICD-10-CM

## 2021-01-18 DIAGNOSIS — R2689 Other abnormalities of gait and mobility: Secondary | ICD-10-CM

## 2021-01-18 NOTE — Therapy (Signed)
[facility] [address] [place], [place], [address] Phone: [phone]   Fax:  [phone]  Physical Therapy Treatment  Patient Details  Name: Justin Hampton MRN: [id number] Date of Birth: 1946/03/29 Referring Provider (PT): Arn Medal, MD   Encounter Date: 01/18/2021   PT End of Session - 01/18/21 1158    Visit Number 17    Date for PT Re-Evaluation 01/18/21    Authorization Type UHC medicare    Progress Note Due on Visit 20    PT Start Time 1152   PT ran late   PT Stop Time 1230    PT Time Calculation (min) 38 min    Activity Tolerance Patient tolerated treatment well    Behavior During Therapy [facility] for tasks assessed/performed           Past Medical History:  Diagnosis Date  . Adenomatous colon polyp   . Arthritis    fingers  . 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 Cirrhosis ([place])   . Diverticulosis   . GERD (gastroesophageal reflux disease)   . 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 Hepatic encephalopathy ([place]) 04/12/2012  . Hepatitis C    treated  . Hypoglycemia   . Hypothyroidism     Past Surgical History:  Procedure Laterality Date  . APPENDECTOMY    . CARPAL TUNNEL RELEASE Bilateral   . COLONOSCOPY    . ESOPHAGOGASTRODUODENOSCOPY     x 2  . ESOPHAGOGASTRODUODENOSCOPY (EGD) WITH PROPOFOL N/A 10/29/2018   Procedure: ESOPHAGOGASTRODUODENOSCOPY (EGD) WITH PROPOFOL;  Surgeon: Rush Landmark [name], MD;  Location: [facility];  Service: Gastroenterology;  Laterality: N/A;  . EUS N/A 10/29/2018   Procedure: UPPER ENDOSCOPIC ULTRASOUND (EUS) RADIAL;  Surgeon: Rush Landmark [name], MD;  Location: [facility];  Service: Gastroenterology;  Laterality: N/A;    There were no vitals filed for this visit.   Subjective Assessment - 01/18/21 1155    Subjective Rt knee pain is 80-90% better.  Remaining pain is in Rt anterior thigh/hip but is intermittent.    Pertinent  History use of cane    Limitations Standing;Walking;Sitting    How long can you sit comfortably? pain in Rt knee after sitting too long, stairs    Diagnostic tests x-ray: OA of Rt knee, OA of Rt hip    Patient Stated Goals reduce Rt knee pain with standing and with sit to stand transition    Currently in Pain? No/denies    Pain Onset More than a month ago    Pain Frequency Intermittent    Aggravating Factors  rising from bed (stiff), stairs    Pain Relieving Factors rest, heat, injection helped              [facility] PT Assessment - 01/18/21 0001      Assessment   Medical Diagnosis primary OA of the Rt knee    Referring Provider (PT) Arn Medal, MD    Onset Date/Surgical Date 09/07/20      Observation/Other Assessments   Focus on Therapeutic Outcomes (FOTO)  60%, improved from 51% at eval      Posture/Postural Control   Postural Limitations Increased thoracic kyphosis;Forward head;Rounded Shoulders    Posture Comments can correct partially with VC      ROM / Strength   AROM / PROM / Strength Strength      AROM   Overall AROM Comments Rt knee ROM [facility]  Strength   Overall Strength Comments Rt hip and knee 5/5 with some mild pain on hip flexion testing      Flexibility   Hamstrings limited 30% bil      Ambulation/Gait   Assistive device Straight cane    Gait Pattern Step-through pattern;Decreased step length - left;Decreased step length - right                         [facility] Adult PT Treatment/Exercise - 01/18/21 0001      Knee/Hip Exercises: Aerobic   Nustep L3 x 10' PT present to review goals/do FOTO      Knee/Hip Exercises: Standing   Heel Raises Both;1 set;20 reps    Heel Raises Limitations UE support    Rocker Board 2 minutes   bil UE support, VC to use trunk weight shift and ankle control vs UEs to generate rocking on board   Other Standing Knee Exercises march taps to 1st step bil UE support x 20- standing on foam pad      Knee/Hip  Exercises: Seated   Long Arc Quad Strengthening;10 reps;1 Advertising copywriter Strengthening;2 sets;10 reps;Weights;Both    Marching Limitations holding 5# kettlebell on thigh    Hamstring Curl Strengthening;1 set;10 reps    Hamstring Limitations yellow loop band    Abduction/Adduction  Strengthening    Abd/Adduction Limitations yellow loop seated clam x 30    Sit to Sand 1 set;10 reps   VC for COG forward then stand tall through tspine                   PT Short Term Goals - 01/04/21 1157      PT SHORT TERM GOAL #1   Title be independent in initial HEP    Status Achieved      PT SHORT TERM GOAL #2   Title report < or = to 5/10 Rt knee pain with sit to stand transition    Status Achieved             PT Long Term Goals - 01/18/21 1157      PT LONG TERM GOAL #1   Title be independent in advanced HEP    Status Achieved      PT LONG TERM GOAL #2   Title improve FOTO to > or = 64%    Baseline 60% on 01/18/21    Status Partially Met      PT LONG TERM GOAL #3   Title perform sit to stand with < or = to 2/10 Rt knee pain    Baseline 2/10    Status Achieved      PT LONG TERM GOAL #4   Title improve LE functional strength to perform sit to stand with min to no UE support    Baseline initial rep back of knees on chair, then able to perform without support    Status Achieved                 Plan - 01/18/21 1212    Clinical Impression Statement Pt reports 80-90% improvement in Rt knee pain with PT.  Rt knee and hip are 5/5 for strength and Rt knee has ROM WFL.  He is able to perform sit to stand w/o UE support with pain reaching </= 2/10, meeting goals.  Gait is ind with flexed trunk and use of straight cane, using shortened stride bil.  FOTO improved from 51% to  60% since initial eval demo'ing improved function and nearly meeting FOTO goal.  He had onset of Rt hip and thigh pain within this episode of care but a hip injection has knocked that down significantly, with  Pt report of mild intermittent pain in Rt thigh with ambulation or functional standing tasks.  PT reviewed HEP and determined he is ind with HEP and is ready to d/c.    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    Examination-Participation Restrictions Community Activity    PT Frequency 2x / week    PT Duration 8 weeks    PT Treatment/Interventions ADLs/Self Care Home Management;Cryotherapy;Moist Heat;Electrical Stimulation;Stair training;Gait training;Functional mobility training;Balance training;Neuromuscular re-education;Therapeutic activities;Therapeutic exercise;Patient/family education;Manual techniques;Dry needling;Taping    PT Next Visit Plan d/c to HEP    PT Home Exercise Plan Access Code: [email or web address]           Patient will benefit from skilled therapeutic intervention in order to improve the following deficits and impairments:     Visit Diagnosis: Acute pain of right knee  Other abnormalities of gait and mobility  Stiffness of right hip, not elsewhere classified     Problem List Patient Active Problem List   Diagnosis Date Noted  . Hypoglycemia 10/31/2018  . Impaired functional mobility, balance, gait, and endurance 10/31/2018  . Dilation of pancreatic duct 10/02/2018  . 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 History of encephalopathy 09/28/2018  . Vascular dementia without behavioral disturbance ([place])   . History of hepatitis C   . Hypothyroidism (acquired)   . Gastroesophageal reflux disease   . Aneurysm, cerebral, nonruptured   . AMS (altered mental status) 08/17/2018  . Gait disturbance 08/02/2018  . Mild memory disturbance 08/02/2018  . Hepatic encephalopathy ([place]) 07/13/2018  . 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 CKD (chronic kidney disease), stage III ([place]) 07/13/2018  . Cervical stenosis of spine 05/29/2018  . Degenerative disc disease, cervical 05/29/2018  . Iron deficiency  anemia due to chronic blood loss 09/12/2017  . Rectal bleeding 06/06/2017  . Acute renal failure (ARF) ([place]) 06/06/2017  . Hyponatremia 06/06/2017  . Hyperkalemia 06/06/2017  . Anemia 06/06/2017  . GI bleeding 06/06/2017  . 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 Other dental procedure status 05/22/2017  . Family history of CHF (congestive heart failure) 02-08-2017  . Family history of sudden death 02-08-2017  . Dyspnea 07-27-2016  . Other disorder of circulatory system 04/12/2012  . Phlebolithiasis 04/12/2012  . Abnormal prostate by palpation 04/11/2012  . Benign neoplasm of colon 04/11/2012  . Degenerative joint disease of pelvic region 04/11/2012  . Disorder of prostate 04/11/2012  . Iron excess 04/11/2012  . Lumbosacral spondylosis without myelopathy 04/11/2012  . Nocturia 04/11/2012  . Orthostatic hypotension 04/11/2012  . 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  . Left sided sciatica 04/11/2012  . History of colonic polyps 10/03/2011    Baruch Merl, PT 01/18/21 12:30 PM   [facility] [address] [place], [place], [address] Phone: [phone]   Fax:  [phone]  Name: Justin Hampton MRN: [id number] Date of Birth: August 14, 1946

## 2021-03-31 ENCOUNTER — Other Ambulatory Visit: Payer: Self-pay

## 2021-03-31 ENCOUNTER — Inpatient Hospital Stay (HOSPITAL_COMMUNITY)
Admission: EM | Admit: 2021-03-31 | Discharge: 2021-04-02 | DRG: 314 | Disposition: A | Discharge status: Other Acute Inpatient Hospital | Payer: Medicare Other | Attending: Internal Medicine | Admitting: Internal Medicine

## 2021-03-31 ENCOUNTER — Emergency Department (HOSPITAL_COMMUNITY): Payer: Medicare Other

## 2021-03-31 ENCOUNTER — Encounter (HOSPITAL_COMMUNITY): Payer: Self-pay

## 2021-03-31 DIAGNOSIS — E860 Dehydration: Secondary | ICD-10-CM | POA: Diagnosis present

## 2021-03-31 DIAGNOSIS — B1920 Unspecified viral hepatitis C without hepatic coma: Secondary | ICD-10-CM | POA: Diagnosis present

## 2021-03-31 DIAGNOSIS — D649 Anemia, unspecified: Secondary | ICD-10-CM | POA: Diagnosis present

## 2021-03-31 DIAGNOSIS — I959 Hypotension, unspecified: Secondary | ICD-10-CM | POA: Diagnosis not present

## 2021-03-31 DIAGNOSIS — R55 Syncope and collapse: Secondary | ICD-10-CM | POA: Diagnosis not present

## 2021-03-31 DIAGNOSIS — R54 Age-related physical debility: Secondary | ICD-10-CM | POA: Diagnosis present

## 2021-03-31 DIAGNOSIS — Z8249 Family history of ischemic heart disease and other diseases of the circulatory system: Secondary | ICD-10-CM

## 2021-03-31 DIAGNOSIS — E872 Acidosis, unspecified: Secondary | ICD-10-CM | POA: Diagnosis present

## 2021-03-31 DIAGNOSIS — R195 Other fecal abnormalities: Secondary | ICD-10-CM | POA: Diagnosis present

## 2021-03-31 DIAGNOSIS — Z823 Family history of stroke: Secondary | ICD-10-CM

## 2021-03-31 DIAGNOSIS — I493 Ventricular premature depolarization: Secondary | ICD-10-CM | POA: Diagnosis present

## 2021-03-31 DIAGNOSIS — Z96641 Presence of right artificial hip joint: Secondary | ICD-10-CM | POA: Diagnosis present

## 2021-03-31 DIAGNOSIS — D67 Hereditary factor IX deficiency: Secondary | ICD-10-CM | POA: Diagnosis present

## 2021-03-31 DIAGNOSIS — I671 Cerebral aneurysm, nonruptured: Secondary | ICD-10-CM | POA: Diagnosis present

## 2021-03-31 DIAGNOSIS — Z833 Family history of diabetes mellitus: Secondary | ICD-10-CM

## 2021-03-31 DIAGNOSIS — E039 Hypothyroidism, unspecified: Secondary | ICD-10-CM | POA: Diagnosis present

## 2021-03-31 DIAGNOSIS — I472 Ventricular tachycardia: Secondary | ICD-10-CM | POA: Diagnosis present

## 2021-03-31 DIAGNOSIS — Z803 Family history of malignant neoplasm of breast: Secondary | ICD-10-CM

## 2021-03-31 DIAGNOSIS — N179 Acute kidney failure, unspecified: Secondary | ICD-10-CM | POA: Diagnosis not present

## 2021-03-31 DIAGNOSIS — K7460 Unspecified cirrhosis of liver: Secondary | ICD-10-CM | POA: Diagnosis present

## 2021-03-31 DIAGNOSIS — Z87891 Personal history of nicotine dependence: Secondary | ICD-10-CM

## 2021-03-31 DIAGNOSIS — B182 Chronic viral hepatitis C: Secondary | ICD-10-CM | POA: Diagnosis present

## 2021-03-31 DIAGNOSIS — K219 Gastro-esophageal reflux disease without esophagitis: Secondary | ICD-10-CM | POA: Diagnosis present

## 2021-03-31 DIAGNOSIS — R197 Diarrhea, unspecified: Secondary | ICD-10-CM | POA: Diagnosis present

## 2021-03-31 DIAGNOSIS — R627 Adult failure to thrive: Secondary | ICD-10-CM | POA: Diagnosis present

## 2021-03-31 DIAGNOSIS — K7469 Other cirrhosis of liver: Secondary | ICD-10-CM | POA: Diagnosis present

## 2021-03-31 DIAGNOSIS — Z20822 Contact with and (suspected) exposure to covid-19: Secondary | ICD-10-CM | POA: Diagnosis present

## 2021-03-31 LAB — CBC WITH DIFFERENTIAL/PLATELET
Abs Immature Granulocytes: 0.05 10*3/uL (ref 0.00–0.07)
Basophils Absolute: 0 10*3/uL (ref 0.0–0.1)
Basophils Relative: 0 %
Eosinophils Absolute: 0.1 10*3/uL (ref 0.0–0.5)
Eosinophils Relative: 1 %
HCT: 32 % — ABNORMAL LOW (ref 39.0–52.0)
Hemoglobin: 10.4 g/dL — ABNORMAL LOW (ref 13.0–17.0)
Immature Granulocytes: 1 %
Lymphocytes Relative: 6 %
Lymphs Abs: 0.6 10*3/uL — ABNORMAL LOW (ref 0.7–4.0)
MCH: 35.7 pg — ABNORMAL HIGH (ref 26.0–34.0)
MCHC: 32.5 g/dL (ref 30.0–36.0)
MCV: 110 fL — ABNORMAL HIGH (ref 80.0–100.0)
Monocytes Absolute: 1 10*3/uL (ref 0.1–1.0)
Monocytes Relative: 10 %
Neutro Abs: 7.8 10*3/uL — ABNORMAL HIGH (ref 1.7–7.7)
Neutrophils Relative %: 82 %
Platelets: 149 10*3/uL — ABNORMAL LOW (ref 150–400)
RBC: 2.91 MIL/uL — ABNORMAL LOW (ref 4.22–5.81)
RDW: 13.1 % (ref 11.5–15.5)
WBC: 9.5 10*3/uL (ref 4.0–10.5)
nRBC: 0 % (ref 0.0–0.2)

## 2021-03-31 LAB — PROTIME-INR
INR: 1.3 — ABNORMAL HIGH (ref 0.8–1.2)
Prothrombin Time: 16.1 seconds — ABNORMAL HIGH (ref 11.4–15.2)

## 2021-03-31 LAB — COMPREHENSIVE METABOLIC PANEL
ALT: 15 U/L (ref 0–44)
AST: 40 U/L (ref 15–41)
Albumin: 2.6 g/dL — ABNORMAL LOW (ref 3.5–5.0)
Alkaline Phosphatase: 68 U/L (ref 38–126)
Anion gap: 7 (ref 5–15)
BUN: 39 mg/dL — ABNORMAL HIGH (ref 8–23)
CO2: 23 mmol/L (ref 22–32)
Calcium: 8.5 mg/dL — ABNORMAL LOW (ref 8.9–10.3)
Chloride: 105 mmol/L (ref 98–111)
Creatinine, Ser: 1.3 mg/dL — ABNORMAL HIGH (ref 0.61–1.24)
GFR, Estimated: 57 mL/min — ABNORMAL LOW (ref >60)
Glucose, Bld: 110 mg/dL — ABNORMAL HIGH (ref 70–99)
Potassium: 4.9 mmol/L (ref 3.5–5.1)
Sodium: 135 mmol/L (ref 135–145)
Total Bilirubin: 1.2 mg/dL (ref 0.3–1.2)
Total Protein: 5.7 g/dL — ABNORMAL LOW (ref 6.5–8.1)

## 2021-03-31 LAB — URINALYSIS, ROUTINE W REFLEX MICROSCOPIC
Bilirubin Urine: NEGATIVE
Glucose, UA: NEGATIVE mg/dL
Ketones, ur: NEGATIVE mg/dL
Leukocytes,Ua: NEGATIVE
Nitrite: NEGATIVE
Protein, ur: NEGATIVE mg/dL
Specific Gravity, Urine: 1.019 (ref 1.005–1.030)
pH: 6 (ref 5.0–8.0)

## 2021-03-31 LAB — RESP PANEL BY RT-PCR (FLU A&B, COVID) ARPGX2
Influenza A by PCR: NEGATIVE
Influenza B by PCR: NEGATIVE
SARS Coronavirus 2 by RT PCR: NEGATIVE

## 2021-03-31 LAB — LACTIC ACID, PLASMA
Lactic Acid, Venous: 2.4 mmol/L (ref 0.5–1.9)
Lactic Acid, Venous: 2 mmol/L (ref 0.5–1.9)

## 2021-03-31 LAB — MAGNESIUM: Magnesium: 2.2 mg/dL (ref 1.7–2.4)

## 2021-03-31 LAB — TSH: TSH: 0.74 u[IU]/mL (ref 0.350–4.500)

## 2021-03-31 LAB — SODIUM, URINE, RANDOM: Sodium, Ur: 50 mmol/L

## 2021-03-31 LAB — AMMONIA: Ammonia: 34 umol/L (ref 9–35)

## 2021-03-31 LAB — CREATININE, URINE, RANDOM: Creatinine, Urine: 114.57 mg/dL

## 2021-03-31 MED ORDER — SODIUM CHLORIDE 0.9 % IV BOLUS (SEPSIS)
1000.0000 mL | Freq: Once | INTRAVENOUS | Status: AC
  Administered 2021-03-31: 1000 mL via INTRAVENOUS

## 2021-03-31 MED ORDER — SODIUM CHLORIDE 0.9 % IV SOLN
1000.0000 mL | INTRAVENOUS | Status: DC
  Administered 2021-03-31 – 2021-04-02 (×3): 1000 mL via INTRAVENOUS

## 2021-03-31 MED ORDER — ALBUTEROL SULFATE HFA 108 (90 BASE) MCG/ACT IN AERS: 1.0000 | INHALATION_SPRAY | Freq: Four times a day (QID) | RESPIRATORY_TRACT | Status: DC | PRN

## 2021-03-31 MED ORDER — ALBUTEROL SULFATE (2.5 MG/3ML) 0.083% IN NEBU: 2.5000 mg | INHALATION_SOLUTION | Freq: Four times a day (QID) | RESPIRATORY_TRACT | Status: DC | PRN

## 2021-03-31 MED ORDER — ACETAMINOPHEN 325 MG PO TABS
650.0000 mg | ORAL_TABLET | Freq: Four times a day (QID) | ORAL | Status: DC | PRN
  Administered 2021-04-01 – 2021-04-02 (×2): 650 mg via ORAL
  Filled 2021-03-31 (×2): qty 2

## 2021-03-31 MED ORDER — LEVOTHYROXINE SODIUM 50 MCG PO TABS
50.0000 ug | ORAL_TABLET | Freq: Every day | ORAL | Status: DC
  Administered 2021-04-01: 50 ug via ORAL
  Filled 2021-03-31: qty 1

## 2021-03-31 MED ORDER — TRAMADOL HCL 50 MG PO TABS
50.0000 mg | ORAL_TABLET | Freq: Three times a day (TID) | ORAL | Status: DC | PRN
Start: 2021-03-31 — End: 2021-04-03

## 2021-03-31 MED ORDER — ACETAMINOPHEN 650 MG RE SUPP: 650.0000 mg | Freq: Four times a day (QID) | RECTAL | Status: DC | PRN

## 2021-03-31 MED ORDER — ENOXAPARIN SODIUM 40 MG/0.4ML IJ SOSY
40.0000 mg | PREFILLED_SYRINGE | INTRAMUSCULAR | Status: DC
  Administered 2021-04-01: 40 mg via SUBCUTANEOUS
  Filled 2021-03-31: qty 0.4

## 2021-03-31 MED ORDER — CHLORHEXIDINE GLUCONATE CLOTH 2 % EX PADS
6.0000 | MEDICATED_PAD | Freq: Every day | CUTANEOUS | Status: DC
  Administered 2021-04-02: 6 via TOPICAL

## 2021-03-31 MED ORDER — SODIUM CHLORIDE 0.9% FLUSH: 10.0000 mL | INTRAVENOUS | Status: DC | PRN

## 2021-03-31 MED ORDER — SODIUM CHLORIDE 0.9 % IV SOLN
1000.0000 mL | INTRAVENOUS | Status: DC
  Administered 2021-03-31 (×2): 1000 mL via INTRAVENOUS

## 2021-03-31 NOTE — ED Triage Notes (Signed)
Pt BIB EMS. Pt had right hip surgery on Monday. Pt was d/c today from [place] wife called EMS to help get pt into house. On EMS arrival pt was weak, lethargic, hypotensive at 52/30. EMS gave 300 cc fluids, pressure increased to 96/52. Pt is on lactulose and was given a stool softener to take yesterday and is now having diarrhea.

## 2021-03-31 NOTE — Progress Notes (Signed)
Brief note regarding plan, with full H&P to follow:  75 year old male with history of cirrhosis, recent total hip arthroplasty in the setting osteoarthritis, who is admitted for overnight observation for further evaluation management of single episode of near syncope earlier today.  Suspect contribution from dehydration in the setting of recent resumption/escalation of oral bowel regimen, including resumption of lactulose, resulting in several episodes of loose stool over the course of the last day in the absence of any associated abdominal discomfort.  No evidence to suggest underlying infectious process at this time.  Patient initially mildly hypotensive, but has been responsive to IV fluids, with ensuing normotensive blood pressures, further suggesting element of presenting dehydration.  Will provide additional continuous IV fluids overnight, will monitoring for evidence of acute volume overload in the setting of underlying cirrhosis, with close monitoring of ensuing blood pressure.  We will also check orthostatic vital signs.  Close monitor on telemetry. hold home lactulose for the remainder of today given at least 5-6 bowel movements earlier today.  Not associate with any recent chest pain, shortness of breath.  Presentation initially also appears less suggestive of acute pulmonary embolism.    Babs Bertin, DO Hospitalist

## 2021-03-31 NOTE — ED Notes (Signed)
ED TO INPATIENT HANDOFF REPORT  Name/Age/Gender Justin Hampton 75 y.o. male  Code Status    Code Status Orders  (From admission, onward)         Start     Ordered   03/31/21 2114  Full code  Continuous        03/31/21 2114        Code Status History    Date Active Date Inactive Code Status Order ID Comments User Context   08/17/2018 2356 08/20/2018 2045 Full Code [id number]  Justin Bing, DO ED   07/13/2018 2123 07/15/2018 1704 Full Code [id number]  Justin Roche, MD ED   06/06/2017 0745 06/07/2017 0242 Full Code [id number]  Justin Gravel, MD ED   Advance Care Planning Activity      Home/SNF/Other Home  Chief Complaint Near syncope [R55]  Level of Care/Admitting Diagnosis ED Disposition    ED Disposition Condition [facility]: [facility] [[id number]]  Level of Care: Telemetry [5]  Admit to tele based on following criteria: Monitor for Ischemic changes  May place patient in observation at [facility] or [place] if equivalent level of care is available:: No  Covid Evaluation: Confirmed COVID Negative  Diagnosis: Near syncope [568127]  Admitting Physician: Justin Hampton [[id number]]  Attending Physician: Justin Hampton [[id number]]       Medical History Past Medical History:  Diagnosis Date  . Adenomatous colon polyp   . Arthritis    fingers  . 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 Cirrhosis ([place])   . Diverticulosis   . GERD (gastroesophageal reflux disease)   . 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 Hepatic encephalopathy ([place]) 04/12/2012  . Hepatitis C    treated  . Hypoglycemia   . Hypothyroidism     Allergies Allergies  Allergen Reactions  . Morphine Other (See Comments)    Caused confusion/During appendectomy  . Zolpidem Other (See Comments)    Confusion     IV Location/Drains/Wounds Patient Lines/Drains/Airways Status    Active Line/Drains/Airways    Name  Placement date Placement time Site Days   Peripheral IV 03/31/21 20 G 1" Left;Proximal;Posterior Forearm 03/31/21  --  Forearm  less than 1          Labs/Imaging Results for orders placed or performed during the hospital encounter of 03/31/21 (from the past 48 hour(s))  CBC with Differential/Platelet     Status: Abnormal   Collection Time: 03/31/21  5:52 PM  Result Value Ref Range   WBC 9.5 4.0 - 10.5 K/uL   RBC 2.91 (L) 4.22 - 5.81 MIL/uL   Hemoglobin 10.4 (L) 13.0 - 17.0 g/dL   HCT 32.0 (L) 39.0 - 52.0 %   MCV 110.0 (H) 80.0 - 100.0 fL   MCH 35.7 (H) 26.0 - 34.0 pg   MCHC 32.5 30.0 - 36.0 g/dL   RDW 13.1 11.5 - 15.5 %   Platelets 149 (L) 150 - 400 K/uL   nRBC 0.0 0.0 - 0.2 %   Neutrophils Relative % 82 %   Neutro Abs 7.8 (H) 1.7 - 7.7 K/uL   Lymphocytes Relative 6 %   Lymphs Abs 0.6 (L) 0.7 - 4.0 K/uL   Monocytes Relative 10 %   Monocytes Absolute 1.0 0.1 - 1.0 K/uL   Eosinophils Relative 1 %   Eosinophils Absolute 0.1 0.0 - 0.5 K/uL   Basophils Relative 0 %   Basophils  Absolute 0.0 0.0 - 0.1 K/uL   Immature Granulocytes 1 %   Abs Immature Granulocytes 0.05 0.00 - 0.07 K/uL    Comment: Performed at [facility], [place] [address] [place], [place] [address]  Comprehensive metabolic panel     Status: Abnormal   Collection Time: 03/31/21  5:52 PM  Result Value Ref Range   Sodium 135 135 - 145 mmol/L   Potassium 4.9 3.5 - 5.1 mmol/L   Chloride 105 98 - 111 mmol/L   CO2 23 22 - 32 mmol/L   Glucose, Bld 110 (H) 70 - 99 mg/dL    Comment: Glucose reference range applies only to samples taken after fasting for at least 8 hours.   BUN 39 (H) 8 - 23 mg/dL   Creatinine, Ser 1.30 (H) 0.61 - 1.24 mg/dL   Calcium 8.5 (L) 8.9 - 10.3 mg/dL   Total Protein 5.7 (L) 6.5 - 8.1 g/dL   Albumin 2.6 (L) 3.5 - 5.0 g/dL   AST 40 15 - 41 U/L   ALT 15 0 - 44 U/L   Alkaline Phosphatase 68 38 - 126 U/L   Total Bilirubin 1.2 0.3 - 1.2 mg/dL   GFR, Estimated 57 (L) >60 mL/min     Comment: (NOTE) Calculated using the CKD-EPI Creatinine Equation (2021)    Anion gap 7 5 - 15    Comment: Performed at [facility], [place] [address] [place], [place] [address]  Lactic acid, plasma     Status: Abnormal   Collection Time: 03/31/21  5:52 PM  Result Value Ref Range   Lactic Acid, Venous 2.4 (HH) 0.5 - 1.9 mmol/L    Comment: CRITICAL RESULT CALLED TO, READ BACK BY AND VERIFIED WITH: Justin Hampton @ 1948 ON 03/31/21 C VARNER Performed at [facility], [place] [address], [place], [place] [address]   Ammonia     Status: None   Collection Time: 03/31/21  5:52 PM  Result Value Ref Range   Ammonia 34 9 - 35 umol/L    Comment: Performed at [facility], [place] [address], [place], [place] [address]  Type and screen [place]     Status: None (Preliminary result)   Collection Time: 03/31/21  5:52 PM  Result Value Ref Range   ABO/RH(D) O POS    Antibody Screen      NEG Performed at [facility], [place] [address] [place], [place] [address]    Sample Expiration PENDING   Resp Panel by RT-PCR (Flu A&B, Covid) Nasopharyngeal Swab     Status: None   Collection Time: 03/31/21  7:00 PM   Specimen: Nasopharyngeal Swab; Nasopharyngeal(NP) swabs in vial transport medium  Result Value Ref Range   SARS Coronavirus 2 by RT PCR NEGATIVE NEGATIVE    Comment: (NOTE) SARS-CoV-2 target nucleic acids are NOT DETECTED.  The SARS-CoV-2 RNA is generally detectable in upper respiratory specimens during the acute phase of infection. The lowest concentration of SARS-CoV-2 viral copies this assay can detect is 138 copies/mL. A negative result does not preclude SARS-Cov-2 infection and should not be used as the sole basis for treatment or other patient management decisions. A negative result may occur with  improper specimen collection/handling, submission of specimen other than nasopharyngeal swab,  presence of viral mutation(s) within the areas targeted by this assay, and inadequate number of viral copies(<138 copies/mL). A negative result must be combined with clinical observations, patient history, and epidemiological information. The  expected result is Negative.  Fact Sheet for Patients:  EntrepreneurPulse.com.au  Fact Sheet for Healthcare Providers:  IncredibleEmployment.be  This test is no t yet approved or cleared by the [place] FDA and  has been authorized for detection and/or diagnosis of SARS-CoV-2 by FDA under an Emergency Use Authorization (EUA). This EUA will remain  in effect (meaning this test can be used) for the duration of the COVID-19 declaration under Section 564(b)(1) of the Act, 21 U.S.C.section 360bbb-3(b)(1), unless the authorization is terminated  or revoked sooner.       Influenza A by PCR NEGATIVE NEGATIVE   Influenza B by PCR NEGATIVE NEGATIVE    Comment: (NOTE) The Xpert Xpress SARS-CoV-2/FLU/RSV plus assay is intended as an aid in the diagnosis of influenza from Nasopharyngeal swab specimens and should not be used as a sole basis for treatment. Nasal washings and aspirates are unacceptable for Xpert Xpress SARS-CoV-2/FLU/RSV testing.  Fact Sheet for Patients: EntrepreneurPulse.com.au  Fact Sheet for Healthcare Providers: IncredibleEmployment.be  This test is not yet approved or cleared by the [place] FDA and has been authorized for detection and/or diagnosis of SARS-CoV-2 by FDA under an Emergency Use Authorization (EUA). This EUA will remain in effect (meaning this test can be used) for the duration of the COVID-19 declaration under Section 564(b)(1) of the Act, 21 U.S.C. section 360bbb-3(b)(1), unless the authorization is terminated or revoked.  Performed at [facility], [place] [address] [place], [place] [address]   Urinalysis,  Routine w reflex microscopic     Status: Abnormal   Collection Time: 03/31/21  7:23 PM  Result Value Ref Range   Color, Urine YELLOW YELLOW   APPearance CLEAR CLEAR   Specific Gravity, Urine 1.019 1.005 - 1.030   pH 6.0 5.0 - 8.0   Glucose, UA NEGATIVE NEGATIVE mg/dL   Hgb urine dipstick SMALL (A) NEGATIVE   Bilirubin Urine NEGATIVE NEGATIVE   Ketones, ur NEGATIVE NEGATIVE mg/dL   Protein, ur NEGATIVE NEGATIVE mg/dL   Nitrite NEGATIVE NEGATIVE   Leukocytes,Ua NEGATIVE NEGATIVE    Comment: Performed at [place] [address] [place], [place] [address]  Lactic acid, plasma     Status: Abnormal   Collection Time: 03/31/21  8:08 PM  Result Value Ref Range   Lactic Acid, Venous 2.0 (HH) 0.5 - 1.9 mmol/L    Comment: CRITICAL VALUE NOTED.  VALUE IS CONSISTENT WITH PREVIOUSLY REPORTED AND CALLED VALUE. Performed at [facility], [place] [address], [place], [place] [address]    DG Chest Portable 1 View  Result Date: 03/31/2021 CLINICAL DATA:  Weakness. EXAM: PORTABLE CHEST 1 VIEW COMPARISON:  07/13/2018 FINDINGS: Right upper extremity PICC tip in the upper SVC. Chronic hyperinflation. No focal airspace disease. Stable heart size and mediastinal contours. No pleural fluid or pneumothorax. No pulmonary edema. No acute osseous abnormalities are seen. IMPRESSION: 1. Chronic hyperinflation without acute abnormality. 2. Right upper extremity PICC tip in the upper SVC. Electronically Signed   By: Keith Rake M.D.   On: 03/31/2021 18:06    Pending Labs Unresulted Labs (From admission, onward)          Start     Ordered   03/31/21 1718  Urine Culture  Once,   STAT        03/31/21 1719   03/31/21 1718  Blood culture (routine x 2)  BLOOD CULTURE X 2,   STAT      03/31/21 1719  Vitals/Pain Today's Vitals   03/31/21 2030 03/31/21 2045 03/31/21 2100 03/31/21 2115  BP: 121/77 117/74 119/83 99/64  Pulse: (!) 102 (!) 104 (!) 103 (!) 105   Resp: 13 18 18 16   Temp:      TempSrc:      SpO2: 98% 93% 98% 99%  Weight:      Height:      PainSc:        Isolation Precautions No active isolations  Medications Medications  sodium chloride 0.9 % bolus 1,000 mL (0 mLs Intravenous Stopped 03/31/21 2008)    Followed by  0.9 %  sodium chloride infusion (1,000 mLs Intravenous New Bag/Given 03/31/21 1751)  acetaminophen (TYLENOL) tablet 650 mg (has no administration in time range)    Or  acetaminophen (TYLENOL) suppository 650 mg (has no administration in time range)    Mobility non-ambulatory (recent right hip surgery)

## 2021-03-31 NOTE — ED Provider Notes (Signed)
[facility] DEPT Provider Note   CSN: [id number] Arrival date & time: 03/31/21  1637     History Chief Complaint  Patient presents with  . Hypotension    Justin Hampton is a 75 y.o. male.  HPI   Patient presents to the ED for evaluation of weakness and hypotension.  Patient recently was admitted to the hospital at [facility] in [place] for right hip surgery.  Patient states he was released from the hospital today.  He was trying to walk up to his house after getting home and he was feeling very weak.  Patient states each time he attempted to do so he felt like he was getting weaker.  He had to call EMS.  When EMS arrived they noted his blood pressure was low at 52/30.  He was given 300 cc of fluids and his blood pressure is now increased.  Patient states he does have history of chronic liver disease and is on lactulose.  He states when he was in the hospital however they add an additional stool softener and he has been having multiple loose stools.  He denies any blood in his stool.  He denies any fevers.  He is not have any chest pain or shortness of breath.  No abdominal pain.  Past Medical History:  Diagnosis Date  . Adenomatous colon polyp   . Arthritis    fingers  . 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 Cirrhosis ([place])   . Diverticulosis   . GERD (gastroesophageal reflux disease)   . 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 Hepatitis C    treated  . Hypoglycemia   . Hypothyroidism     Patient Active Problem List   Diagnosis Date Noted  . Hypoglycemia 10/31/2018  . Impaired functional mobility, balance, gait, and endurance 10/31/2018  . Dilation of pancreatic duct 10/02/2018  . 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 History of encephalopathy 09/28/2018  . Vascular dementia without behavioral  disturbance ([place])   . History of hepatitis C   . Hypothyroidism (acquired)   . Gastroesophageal reflux disease   . Aneurysm, cerebral, nonruptured   . AMS (altered mental status) 08/17/2018  . Gait disturbance 08/02/2018  . Mild memory disturbance 08/02/2018  . Hepatic encephalopathy ([place]) 07/13/2018  . 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 CKD (chronic kidney disease), stage III ([place]) 07/13/2018  . Cervical stenosis of spine 05/29/2018  . Degenerative disc disease, cervical 05/29/2018  . Iron deficiency anemia due to chronic blood loss 09/12/2017  . Rectal bleeding 06/06/2017  . Acute renal failure (ARF) ([place]) 06/06/2017  . Hyponatremia 06/06/2017  . Hyperkalemia 06/06/2017  . Anemia 06/06/2017  . GI bleeding 06/06/2017  . 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 Other dental procedure status 05/22/2017  . Family history of CHF (congestive heart failure) 05-Feb-2017  . Family history of sudden death 02/05/17  . Dyspnea 07/24/16  . Other disorder of circulatory system 04/12/2012  . Phlebolithiasis 04/12/2012  . Abnormal prostate by palpation 04/11/2012  . Benign neoplasm of colon 04/11/2012  . Degenerative joint disease of pelvic region 04/11/2012  . Disorder of prostate 04/11/2012  . Iron excess 04/11/2012  . Lumbosacral spondylosis without myelopathy 04/11/2012  . Nocturia 04/11/2012  . Orthostatic hypotension 04/11/2012  . 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 Osteoarthritis of  lumbar spine 04/11/2012  . Left sided sciatica 04/11/2012  . History of colonic polyps 10/03/2011    Past Surgical History:  Procedure Laterality Date  . APPENDECTOMY    . CARPAL TUNNEL RELEASE Bilateral   . COLONOSCOPY    . ESOPHAGOGASTRODUODENOSCOPY     x 2  . ESOPHAGOGASTRODUODENOSCOPY (EGD) WITH PROPOFOL N/A 10/29/2018   Procedure: ESOPHAGOGASTRODUODENOSCOPY (EGD) WITH PROPOFOL;  Surgeon: Rush Landmark [name], MD;  Location: [facility];  Service: Gastroenterology;   Laterality: N/A;  . EUS N/A 10/29/2018   Procedure: UPPER ENDOSCOPIC ULTRASOUND (EUS) RADIAL;  Surgeon: Rush Landmark [name], MD;  Location: [facility];  Service: Gastroenterology;  Laterality: N/A;       Family History  Problem Relation Age of Onset  . Dementia Mother 43  . Heart failure Mother   . Osteoarthritis Mother   . Hypertension Mother   . Hypertension Father   . Heart attack Father   . Breast cancer Sister   . Uterine cancer Sister   . Hypertension Sister   . Hemophilia Brother   . Heart disease Brother   . Hypertension Brother   . Hypothyroidism Sister   . Osteoarthritis Sister   . Heart failure Maternal Grandmother   . Stroke Maternal Grandmother   . Hypertension Maternal Grandmother   . Heart failure Maternal Grandfather   . Diabetes Paternal Grandfather   . Colon cancer Neg Hx   . Esophageal cancer Neg Hx   . Inflammatory bowel disease Neg Hx   . Liver disease Neg Hx   . Pancreatic cancer Neg Hx   . Rectal cancer Neg Hx   . Stomach cancer Neg Hx     Social History   Tobacco Use  . Smoking status: Former Smoker    Years: 20.00    Quit date: 08/1995    Years since quitting: 25.6  . Smokeless tobacco: Never Used  Vaping Use  . Vaping Use: Never used  Substance Use Topics  . Alcohol use: Not Currently  . Drug use: No    Home Medications Prior to Admission medications   Medication Sig Start Date End Date Taking? Authorizing Provider  acetaminophen (TYLENOL) 500 MG tablet Take 500 mg by mouth every 6 (six) hours as needed for mild pain.   Yes [provider]  albuterol (PROVENTIL HFA;VENTOLIN HFA) 108 (90 Base) MCG/ACT inhaler Inhale 1-2 puffs into the lungs every 6 (six) hours as needed for wheezing or shortness of breath.   Yes [provider]  coagulation factor IX, recomb, (BENEFIX) 1000 units injection Inject 4,000 Units into the vein See admin instructions. Whenever having surgery  As directed by hematologist 07/28/16  Yes  [provider]  Emollient (AQUAPHOR EX) Apply 1 application topically 2 (two) times daily as needed (for dry skin).  12/08/17  Yes [provider]  enoxaparin (LOVENOX) 40 MG/0.4ML injection Inject 40 mg into the skin daily. 03/31/21  Yes [provider]  HYDROcodone-acetaminophen (NORCO/VICODIN) 5-325 MG tablet Take 1 tablet by mouth every 6 (six) hours as needed for moderate pain or severe pain. 03/31/21  Yes [provider]  lactulose, encephalopathy, (CHRONULAC) 10 GM/15ML SOLN Take 30 g by mouth in the morning, at noon, and at bedtime. 11/30/20  Yes [provider]  levothyroxine (SYNTHROID, LEVOTHROID) 50 MCG tablet Take 50 mcg by mouth daily.   Yes [provider]  magnesium oxide (MAG-OX) 400 (241.3 Mg) MG tablet Take 400 mg by mouth daily.   Yes [provider]  pantoprazole (  PROTONIX) 40 MG tablet Take 40 mg by mouth daily. 01/03/18  Yes [provider]  spironolactone (ALDACTONE) 25 MG tablet Take 25 mg by mouth daily.   Yes [provider]  traMADol (ULTRAM) 50 MG tablet Take 50 mg by mouth every 8 (eight) hours as needed for moderate pain or severe pain. 03/25/21  Yes [provider]  lactulose (CHRONULAC) 10 GM/15ML solution Take 45 mLs (30 g total) by mouth 3 (three) times daily. Patient not taking: No sig reported 08/20/18   Meccariello, Bernita Raisin, DO    Allergies    Morphine and Zolpidem  Review of Systems   Review of Systems  All other systems reviewed and are negative.   Physical Exam Updated Vital Signs BP 117/74   Pulse (!) 104   Temp 98.7 F (37.1 C) (Oral)   Resp 18   Ht 1.702 m (5\' 7" )   Wt 84.4 kg   SpO2 93%   BMI 29.13 kg/m   Physical Exam Vitals and nursing note reviewed.  Constitutional:      General: He is not in acute distress.    Appearance: He is well-developed.  HENT:     Head: Normocephalic and atraumatic.     Right Ear: External ear normal.     Left Ear:  External ear normal.  Eyes:     General: No scleral icterus.       Right eye: No discharge.        Left eye: No discharge.     Conjunctiva/sclera: Conjunctivae normal.  Neck:     Trachea: No tracheal deviation.  Cardiovascular:     Rate and Rhythm: Normal rate and regular rhythm.  Pulmonary:     Effort: Pulmonary effort is normal. No respiratory distress.     Breath sounds: Normal breath sounds. No stridor. No wheezing or rales.  Abdominal:     General: Bowel sounds are normal. There is no distension.     Palpations: Abdomen is soft.     Tenderness: There is no abdominal tenderness. There is no guarding or rebound.  Musculoskeletal:        General: No tenderness.     Cervical back: Neck supple.     Right lower leg: Edema present.  Skin:    General: Skin is warm and dry.     Coloration: Skin is pale.     Findings: No rash.  Neurological:     Mental Status: He is alert.     Cranial Nerves: No cranial nerve deficit (no facial droop, extraocular movements intact, no slurred speech).     Sensory: No sensory deficit.     Motor: No abnormal muscle tone or seizure activity.     Coordination: Coordination normal.     ED Results / Procedures / Treatments   Labs (all labs ordered are listed, but only abnormal results are displayed) Labs Reviewed  CBC WITH DIFFERENTIAL/PLATELET - Abnormal; Notable for the following components:      Result Value   RBC 2.91 (*)    Hemoglobin 10.4 (*)    HCT 32.0 (*)    MCV 110.0 (*)    MCH 35.7 (*)    Platelets 149 (*)    Neutro Abs 7.8 (*)    Lymphs Abs 0.6 (*)    All other components within normal limits  COMPREHENSIVE METABOLIC PANEL - Abnormal; Notable for the following components:   Glucose, Bld 110 (*)    BUN 39 (*)    Creatinine, Ser 1.30 (*)  Calcium 8.5 (*)    Total Protein 5.7 (*)    Albumin 2.6 (*)    GFR, Estimated 57 (*)    All other components within normal limits  LACTIC ACID, PLASMA - Abnormal; Notable for the following  components:   Lactic Acid, Venous 2.4 (*)    All other components within normal limits  LACTIC ACID, PLASMA - Abnormal; Notable for the following components:   Lactic Acid, Venous 2.0 (*)    All other components within normal limits  URINALYSIS, ROUTINE W REFLEX MICROSCOPIC - Abnormal; Notable for the following components:   Hgb urine dipstick SMALL (*)    All other components within normal limits  RESP PANEL BY RT-PCR (FLU A&B, COVID) [id number]  URINE CULTURE  CULTURE, BLOOD (ROUTINE X 2)  CULTURE, BLOOD (ROUTINE X 2)  AMMONIA  TYPE AND SCREEN    EKG EKG Interpretation  Date/Time:  Wednesday March 31 2021 17:56:52 EDT Ventricular Rate:  91 PR Interval:  177 QRS Duration: 94 QT Interval:  376 QTC Calculation: 463 R Axis:   60 Text Interpretation: Sinus rhythm Ventricular premature complex , new since last tracing Low voltage, precordial leads No significant change since last tracing Confirmed by Dorie Rank [phone]) on 03/31/2021 8:18:20 PM   Radiology DG Chest Portable 1 View  Result Date: 03/31/2021 CLINICAL DATA:  Weakness. EXAM: PORTABLE CHEST 1 VIEW COMPARISON:  07/13/2018 FINDINGS: Right upper extremity PICC tip in the upper SVC. Chronic hyperinflation. No focal airspace disease. Stable heart size and mediastinal contours. No pleural fluid or pneumothorax. No pulmonary edema. No acute osseous abnormalities are seen. IMPRESSION: 1. Chronic hyperinflation without acute abnormality. 2. Right upper extremity PICC tip in the upper SVC. Electronically Signed   By: Keith Rake M.D.   On: 03/31/2021 18:06    Procedures .Critical Care Performed by: Dorie Rank, MD Authorized by: Dorie Rank, MD   Critical care provider statement:    Critical care time (minutes):  45   Critical care was time spent personally by me on the following activities:  Discussions with consultants, evaluation of patient's response to treatment, examination of patient, ordering and performing treatments and  interventions, ordering and review of laboratory studies, ordering and review of radiographic studies, pulse oximetry, re-evaluation of patient's condition, obtaining history from patient or surrogate and review of old charts     Medications Ordered in ED Medications  sodium chloride 0.9 % bolus 1,000 mL (0 mLs Intravenous Stopped 03/31/21 2008)    Followed by  0.9 %  sodium chloride infusion (1,000 mLs Intravenous New Bag/Given 03/31/21 1751)    ED Course  I have reviewed the triage vital signs and the nursing notes.  Pertinent labs & imaging results that were available during my care of the patient were reviewed by me and considered in my medical decision making (see chart for details).  Clinical Course as of 03/31/21 2047  Wed Mar 31, 2021  1900 Pressure has improved.  Now up to 191 systolic [JK]  4782 Hemoglobin decreased to 10.4 but not surprising considering his recent hip surgery.  Baseline appears to be around 11 [JK]  2017 Initial lactic acid elevated at 2.4 [JK]  2017 Creatinine elevated compared to previous values [JK]  2025 Urinalysis negative [JK]  2026 Chest x-ray without acute findings [JK]  2045 Lactic acid level is improving [JK]    Clinical Course User Index [JK] Dorie Rank, MD   MDM Rules/Calculators/A&P  Patient presented to the ED for evaluation of a near syncopal episode after recently being in the hospital for hip surgery.  Patient started having a lot of loose stools last evening.  When he went home he felt weak and EMS did note that his blood pressure was low in the 60s and 70s.  On arrival he did have a blood pressure of 83/51.  Was concerned about the possibility of sepsis versus dehydration versus acute cardiac issues.  Patient's blood pressure has improved with hydration.  His laboratory test do show evidence of mild acute kidney injury.  Lactic acid level was initially elevated but it has decreased.  I doubt acute infection at this  time.  Considering his degree of hypotension I think it is reasonable to bring him into the hospital for IV hydration overnight and monitoring.  I will consult medical service. Final Clinical Impression(s) / ED Diagnoses Final diagnoses:  Near syncope  Hypotension, unspecified hypotension type  Dehydration      Dorie Rank, MD 03/31/21 2050

## 2021-03-31 NOTE — H&P (Signed)
History and Physical    PLEASE NOTE THAT DRAGON DICTATION SOFTWARE WAS USED IN THE CONSTRUCTION OF THIS NOTE.   Justin Hampton TGG:[id number] DOB: [birth year] DOA: 03/31/2021  PCP: Bartholome Bill, MD Patient coming from: home   I have personally briefly reviewed patient's old medical records in [place]  Chief Complaint: Dizziness  HPI: Justin Hampton is a 75 y.o. 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   The following history is provided via my discussions with the patient, my discussions with the patient's sister Justin Hampton, who is the patient's medical power of attorney), in addition to my discussions with the emergency department physician and via chart review.  In the setting of a history of severe osteoarthritis, the patient underwent elective total right hip arthroplasty earlier in the week at [facility], before being discharged to home earlier today.  Following postoperative resumption of home lactulose, at which time the patient reports that he was also started on a stool softener that is typically not a component of his outpatient oral bowel regimen, the patient reports development of 5-6 daily episodes of loose stool starting 2 days ago, while still at [facility].  He reports that this is in excess of his baseline frequency of bowel movements, which she notes to be 2-3 daily episodes on his home lactulose 30 g p.o. 3 times daily.  Denies any associated abdominal discomfort, melena, or hematochezia.  He also denies any associated subjective fever, chills, rigors, or generalized  myalgias.  He was subsequently discharged to home earlier today from [facility].  Upon arriving at home, the patient reports that he attempted to get out the car, at which time, upon moving from a prolonged seated position to a standing position, felt dizzy, lightheaded, saw dark spots, and felt as if he would lose consciousness.  The patient's sister was able to provide stabilizing support to the patient, and the patient confirms no associated loss of consciousness or fall.  He denies any associated or ensuing chest pain, shortness of breath, nausea, vomiting.  However, in the setting of this episode of dizziness, which is subsequently resolved, EMS was contacted, and the patient was brought to [facility] emergency department for further evaluation of such.  The patient denies any recent or associated headache, neck stiffness, sore throat, cough, or rash.  No recent traveling or known COVID-19 exposures.  Denies any associated dysuria, gross hematuria, or change in urinary urgency/frequency.  Denies any recent worsening of peripheral edema, denies any recent acute calf tenderness, or new onset erythema in the lower extremities.  Denies any recent hemoptysis.   He reports a history of orthostatic hypotension, stating that he previously underwent tilt table testing several years ago which was reportedly suggestive of orthostatic hypotension.  Outpatient antihypertensive/diuretic regimen consists of spironolactone.  Medical history notable for a history of hepatitis C, which the patient reports was transfusion associated.  He notes that he follows with hepatology, and following course of antiviral medications, reports serial hepatitis C viral loads.  However, he reports that several years elapsed following initial diagnosis of hepatitis C before the above antiviral intervention.  In this setting, he reports  that he developed cirrhosis.  Denies any recent or regular alcohol consumption, no  history of alcohol abuse.  He also reports a history of hemophilia B.  In the setting of recent total right hip arthroplasty, the patient has been instructed to take Lovenox 40 mg subcu daily over the next 4 weeks, with most recent dose administered on the morning of 03/31/2021 prior to discharge from [facility].  He notes that his next dose of Lovenox is scheduled to occur on the morning of 04/01/2021.  Denies any recent melena or hematochezia.    ED Course:  Vital signs in the ED were notable for the following: Tetramex 98.7; heart rate 84-1 04; initial blood pressure noted to be 83/51, with ensuing improvement to 123/77 following administration of IV fluids, as further detailed below; respiratory rate 16-21, oxygen saturation 97 to 99% on room air.  Labs were notable for the following: CMP was notable for the following: Sodium 135, bicarbonate 23, anion gap 7, BUN 39, creatinine 1.3, relative to most recent prior serum creatinine data point 0.85 when checked earlier this morning at [facility], glucose 110, albumin 2.6, otherwise liver enzymes were found to be within normal limits, including total bilirubin of 1.2.  Ammonia 34.  Initial lactic acid noted to be 2.4, with repeat value trending down to 2.0.  CBC notable for white blood cell count of 9500, hemoglobin 10.4 relative to most recent prior value of 10.3 when checked earlier this morning at [facility].  Urinalysis without microscopy was notable for nitrate negative, leukocyte Estrace negative, and specific gravity 1.019.  Nasopharyngeal COVID-19/influenza PCR were checked in the ED today and found to be negative.  EKG shows sinus rhythm with 1 PVC, ventricular rate 91, normal intervals, and no evidence of T wave or ST changes, including no evidence of ST elevation.  Chest x-ray showed no evidence of acute cardiopulmonary process, including no evidence of infiltrate, edema, effusion, or pneumothorax, while  showing evidence of right upper extremity PICC tip in the upper SVC.  While in the ED, the following were administered: Normal saline x1 L bolus followed by initiation of continuous normal saline at 125 cc/h.  Subsequently, the patient was admitted for overnight observation to the med telemetry floor for further evaluation and management of presenting presyncopal episode in the setting of initial hypotension in the setting of acute kidney injury.     Review of Systems: As per HPI otherwise 10 point review of systems negative.   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   Cirrhosis (HCC)    Diverticulosis    GERD (gastroesophageal reflux disease)    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[place])    Hemophilia B   Hepatic encephalopathy (HCC) 04/12/2012   Hepatitis C    treated   Hypoglycemia    Hypothyroidism     Past Surgical History:  Procedure Laterality Date   APPENDECTOMY     CARPAL TUNNEL RELEASE Bilateral    COLONOSCOPY     ESOPHAGOGASTRODUODENOSCOPY     x 2   ESOPHAGOGASTRODUODENOSCOPY (EGD) WITH PROPOFOL N/A 10/29/2018   Procedure: ESOPHAGOGASTRODUODENOSCOPY (EGD) WITH PROPOFOL;  Surgeon: Irving Copas., MD;  Location: [place];  Service: Gastroenterology;  Laterality: N/A;   EUS N/A 10/29/2018   Procedure: UPPER ENDOSCOPIC ULTRASOUND (EUS) RADIAL;  Surgeon: Rush Landmark [name], MD;  Location: [facility]  ENDOSCOPY;  Service: Gastroenterology;  Laterality: N/A;    Social History:  reports that he quit smoking about 25 years ago. He quit after 20.00 years of use. He has never used smokeless tobacco. He reports previous alcohol use. He reports that he does not use drugs.   Allergies  Allergen Reactions   Morphine Other (See Comments)    Caused confusion/During appendectomy   Zolpidem Other (See Comments)    Confusion     Family History  Problem Relation Age of Onset    Dementia Mother 62   Heart failure Mother    Osteoarthritis Mother    Hypertension Mother    Hypertension Father    Heart attack Father    Breast cancer Sister    Uterine cancer Sister    Hypertension Sister    Hemophilia Brother    Heart disease Brother    Hypertension Brother    Hypothyroidism Sister    Osteoarthritis Sister    Heart failure Maternal Grandmother    Stroke Maternal Grandmother    Hypertension Maternal Grandmother    Heart failure Maternal Grandfather    Diabetes Paternal Grandfather    Colon cancer Neg Hx    Esophageal cancer Neg Hx    Inflammatory bowel disease Neg Hx    Liver disease Neg Hx    Pancreatic cancer Neg Hx    Rectal cancer Neg Hx    Stomach cancer Neg Hx     Family history reviewed and not pertinent    Prior to Admission medications   Medication Sig Start Date End Date Taking? Authorizing Provider  acetaminophen (TYLENOL) 500 MG tablet Take 500 mg by mouth every 6 (six) hours as needed for mild pain.   Yes [provider]  albuterol (PROVENTIL HFA;VENTOLIN HFA) 108 (90 Base) MCG/ACT inhaler Inhale 1-2 puffs into the lungs every 6 (six) hours as needed for wheezing or shortness of breath.   Yes [provider]  coagulation factor IX, recomb, (BENEFIX) 1000 units injection Inject 4,000 Units into the vein See admin instructions. Whenever having surgery  As directed by hematologist 07/28/16  Yes [provider]  Emollient (AQUAPHOR EX) Apply 1 application topically 2 (two) times daily as needed (for dry skin).  12/08/17  Yes [provider]  enoxaparin (LOVENOX) 40 MG/0.4ML injection Inject 40 mg into the skin daily. 03/31/21  Yes [provider]  HYDROcodone-acetaminophen (NORCO/VICODIN) 5-325 MG tablet Take 1 tablet by mouth every 6 (six) hours as needed for moderate pain or severe pain. 03/31/21  Yes [provider]  lactulose, encephalopathy, (CHRONULAC) 10 GM/15ML SOLN Take 30 g by mouth in the  morning, at noon, and at bedtime. 11/30/20  Yes [provider]  levothyroxine (SYNTHROID, LEVOTHROID) 50 MCG tablet Take 50 mcg by mouth daily.   Yes [provider]  magnesium oxide (MAG-OX) 400 (241.3 Mg) MG tablet Take 400 mg by mouth daily.   Yes [provider]  pantoprazole (PROTONIX) 40 MG tablet Take 40 mg by mouth daily. 01/03/18  Yes [provider]  spironolactone (ALDACTONE) 25 MG tablet Take 25 mg by mouth daily.   Yes [provider]  traMADol (ULTRAM) 50 MG tablet Take 50 mg by mouth every 8 (eight) hours as needed for moderate pain or severe pain. 03/25/21  Yes [provider]  lactulose (CHRONULAC) 10 GM/15ML solution Take 45 mLs (30 g total) by mouth 3 (three) times daily. Patient not taking: No sig reported 08/20/18   Meccariello, Bernita Raisin, DO  Objective    Physical Exam: Vitals:   03/31/21 2000 03/31/21 2015 03/31/21 2030 03/31/21 2045  BP: 121/76 123/74 121/77 117/74  Pulse: (!) 104 (!) 101 (!) 102 (!) 104  Resp: 19 18 13 18   Temp:      TempSrc:      SpO2: 98% 99% 98% 93%  Weight:      Height:        General: appears to be stated age; alert, oriented Skin: warm, dry, no rash Head:  AT/[place] Mouth:  Oral mucosa membranes appear dry, normal dentition Neck: supple; trachea midline Heart:  RRR; did not appreciate any M/R/G Lungs: CTAB, did not appreciate any wheezes, rales, or rhonchi Abdomen: + BS; soft, ND, NT Vascular: 2+ pedal pulses b/l; 2+ radial pulses b/l Extremities: no peripheral edema, no muscle wasting Neuro: strength and sensation intact in upper and lower extremities b/l    Labs on Admission: I have personally reviewed following labs and imaging studies  CBC: Recent Labs  Lab 03/31/21 1752  WBC 9.5  NEUTROABS 7.8*  HGB 10.4*  HCT 32.0*  MCV 110.0*  PLT 591*   Basic Metabolic Panel: Recent Labs  Lab 03/31/21 1752  NA 135  K 4.9  CL 105  CO2 23  GLUCOSE 110*  BUN 39*   CREATININE 1.30*  CALCIUM 8.5*   GFR: Estimated Creatinine Clearance: 51 mL/min (A) (by C-G formula based on SCr of 1.3 mg/dL (H)). Liver Function Tests: Recent Labs  Lab 03/31/21 1752  AST 40  ALT 15  ALKPHOS 68  BILITOT 1.2  PROT 5.7*  ALBUMIN 2.6*   No results for input(s): LIPASE, AMYLASE in the last 168 hours. Recent Labs  Lab 03/31/21 1752  AMMONIA 34   Coagulation Profile: No results for input(s): INR, PROTIME in the last 168 hours. Cardiac Enzymes: No results for input(s): CKTOTAL, CKMB, CKMBINDEX, TROPONINI in the last 168 hours. BNP (last 3 results) No results for input(s): PROBNP in the last 8760 hours. HbA1C: No results for input(s): HGBA1C in the last 72 hours. CBG: No results for input(s): GLUCAP in the last 168 hours. Lipid Profile: No results for input(s): CHOL, HDL, LDLCALC, TRIG, CHOLHDL, LDLDIRECT in the last 72 hours. Thyroid Function Tests: No results for input(s): TSH, T4TOTAL, FREET4, T3FREE, THYROIDAB in the last 72 hours. Anemia Panel: No results for input(s): VITAMINB12, FOLATE, FERRITIN, TIBC, IRON, RETICCTPCT in the last 72 hours. Urine analysis:    Component Value Date/Time   COLORURINE YELLOW 03/31/2021 1923   APPEARANCEUR CLEAR 03/31/2021 1923   LABSPEC 1.019 03/31/2021 [place] 6.0 03/31/2021 1923   GLUCOSEU NEGATIVE 03/31/2021 1923   HGBUR SMALL (A) 03/31/2021 1923   BILIRUBINUR NEGATIVE 03/31/2021 [place] NEGATIVE 03/31/2021 1923   PROTEINUR NEGATIVE 03/31/2021 1923   UROBILINOGEN 1.0 07/02/2015 1850   NITRITE NEGATIVE 03/31/2021 1923   LEUKOCYTESUR NEGATIVE 03/31/2021 1923    Radiological Exams on Admission: DG Chest Portable 1 View  Result Date: 03/31/2021 CLINICAL DATA:  Weakness. EXAM: PORTABLE CHEST 1 VIEW COMPARISON:  07/13/2018 FINDINGS: Right upper extremity PICC tip in the upper SVC. Chronic hyperinflation. No focal airspace disease. Stable heart size and mediastinal contours. No pleural fluid or  pneumothorax. No pulmonary edema. No acute osseous abnormalities are seen. IMPRESSION: 1. Chronic hyperinflation without acute abnormality. 2. Right upper extremity PICC tip in the upper SVC. Electronically Signed   By: Keith Rake M.D.   On: 03/31/2021 18:06     EKG: Independently reviewed, with result  as described above.    Assessment/Plan   Royston Bekele is a 75 y.o. 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)   Hypothyroidism (acquired)   Gastroesophageal reflux disease   Hypotension   Lactic acidosis   Loose stools   AKI (acute kidney injury) ([place])     #) Near syncope: Single episode of dizziness, lightheadedness, without overt syncope that occurred when rising from a prolonged seated position to standing in the context of recent elective total right hip arthroplasty, with this presentation suggestive of a differential that favors orthostatic hypotension, in the context of a documented prior history thereof with exacerbation stemming from clinical evidence of dehydration due to recent increase in GI losses versus vasovagal syncope, particularly in the setting of recent hip surgery and associated residual right hip discomfort. Not associated with any overt acute focal neurologic deficits. Clinically, acute ischemic stroke versus seizures appear less likely at this time. Presentation appears less consistent with ACS, given the complete absence of any recent chest pain, presenting EKG shows no evidence of acute ischemic  changes.Will check orthostatic vital signs, but with the caveat that the patient has already received IVF's in the ED, potentially altering the results of this evaluation.  Given perceived intravascular depletion stemming from recent increase in GI losses following resumption of lactulose as well as escalation of oral bowel rest noted above, will hold home lactulose this evening, with close monitoring of ensuing volume status.  In this setting, we will also hold home spironolactone for now.     Plan: I have placed a nursing communication order requesting that orthostatic vital signs x 1 set be checked and documented, following which will continue gentle IV fluids in the form of normal saline at 100 cc/h. Monitor on telemetry. Hold home spironolactone and lactulose, as further detailed above, plan to reevaluate volume status in the morning to assist with guidance of timing of resumption of these outpatient medications. Monitor strict I's and O's.  Check CMP, CBC, serum magnesium level in the morning. Fall precautions.       #) Hypotension: Presenting hypotensive blood pressure of 83/51, which is subsequently improved to 123/77 following interval IV fluids, consistent assistant with clinical suspicion for dehydration in the setting of recent increase in GI losses following resumption of lactulose as well as addition of stool softeners to this existing regimen, as further detailed above.  This is all relative to history of cirrhosis, with the patient reporting that his baseline systolic blood pressure runs in the low 100's mmHg. clinically, acute pulmonary embolism appears less likely, particularly given that the patient is anticoagulated on daily Lovenox, as above.  Presentation is suggestive of sepsis.  Additionally, cardiogenic sources are felt to be less likely as well, although ensuing hypotension IV fluid resuscitation may warrant pursuit of echocardiogram to further evaluate.  No evidence of acute  blood loss anemia, with presenting hemoglobin at baseline following recent right hip surgery.   Plan: Continue IV fluids, as above.  Monitor for results of orthostatic vital signs, as ordered above.  Repeat CMP and CBC in  the morning.  Monitor on continuous pulse oximetry.  Monitor telemetry.  Check INR.  Add on microscopy to initial urinalysis.  Monitor strict I's and O's and daily weights.       #) Severe osteoarthritis of the right hip: status post elective total right hip arthroplasty plasty via posterior approach within the last week at [facility], as further detailed above.  On Lovenox 40 mg subcu daily for the next 1 month.  Patient reports adequate pain control at this time.   Plan: Continue home daily Lovenox, as above.  Continue home as needed tramadol.  Repeat CBC in the morning.      #) Lactic acidosis: Initial lactate noted to be elevated 2.4, with interval trend down to 2.0 following IV fluids, as further detailed above.  Suspect that this is multifactorial nature, with contribution from dehydration in the setting of recent increase in GI losses, with additional potential contribution from presenting acute kidney injury, as detailed further below, in addition to possibility of chronic mild elevation of lactate level in the setting of documented history of cirrhosis.  Overall, presentation appears less suggestive of sepsis at this time, as further detailed above, including chest x-ray which shows no evidence of acute cardiopulmonary process, while nasopharyngeal COVID-19/influenza PCR performed in the ED today were found to be negative.  We will add on microscopy to existing urinalysis to further evaluate for any underlying infectious process.  Plan: Continuous IV fluids, as above.  Monitor strict I's and O's and daily weights.  Repeat CMP and CBC in the morning.  Further evaluation management presenting AKI, as above.  Check INR.  Add on microscopy to existing  urinalysis.       #) Loose stool: 2 days of experiencing 5-6 daily episodes of loose stool following resumption of home lactulose as well as escalation of oral bowel regimen with addition of stool softener.  Suspect that this is osmotic in nature given these pharmacologic factors.  Appears less likely to be infectious in nature, with improving frequency of loose stools with increased time relative to most recent dose of lactulose appearing to be consistent with this not infectious train of thought.  Consequently, we will not pursue C. difficile or additional stool studies at this time unless stool output increases and spite of plan for interval holding of home lactulose, as above.  Refrain from stool softeners as well.  In terms of other possible pharmacologic contributions, will hold home Protonix for now.  Plan: Repeat CBC in the morning.  Refraining from stool studies, as above, for now.  Repeat CMP in the morning.  IV fluids as above.  Monitor strict I's and O's and daily weights.  Hold home Protonix for now, as above.  Check TSH.       #) Acute kidney injury: Presenting serum creatinine noted to be 1.3 relative to serum creatinine of 0.85 when checked earlier this morning at [facility], as above.  In the setting of acute prerenal azotemia, suspect prerenal etiology in the setting of dehydration stemming from recent increase in GI losses, as further detailed above.  We will further evaluate by adding on microscopic evaluation to preceding urinalysis, while providing continuous IV fluids, as further detailed above.  Plan: Continuous IV fluids, as above.  Add on microscopic evaluation and urinalysis, with attention for the presence of urinary casts.  Add on random urine sodium as well as random urine creatinine.  Monitor strict I's and O's and daily weights.  Tempt avoid nephrotoxic agents.  Repeat CMP in the morning.  Hold home lactulose and spironolactone for  now.       #) Cirrhosis: In the setting of a history of transfusion acquired hepatitis C, with the latter managed via prior antiviral therapy is mediated by hematology, with patient reporting ensuing hep C viral load of 0.  Patient denies any history of alcohol abuse.  Unable to calculate meld score at this time in the absence of INR.  The patient reports that he is not a candidate for liver transplant given comorbidity of hemophilia B.  Plan: Repeat CMP in the morning.  Add on INR and recheck this value in the morning as well.  Monitor strict I's and O's and daily weights.  Holding home lactulose and spironolactone for the remainder of this evening, as further detailed above.      #) Acquired hypothyroidism: Documented history of such on Synthroid as an outpatient.  In the setting of presenting worsening of loose stool over the last 2 days, will also check TSH.  Plan: Check TSH, as above.  Continue home Synthroid.      #) GERD: On Protonix as an outpatient.  In the setting of recent increase in loose stool output, will hold home PPI for now.  Plan: Hold PPI for now, as above.      DVT prophylaxis: scd's + Lovenox 40 mg subcu daily (per discharge medications from [facility] hospital in context of recent right hip arthroplasty) Code Status: Full code Family Communication: Patient's case was discussed with his sister, Justin Hampton, who is the patient's POA Disposition Plan: Per Rounding Team Consults called: none  Admission status: Observation; med telemetry.     Of note, this patient was added by me to the following Admit List/Treatment Team: [email or web address].      PLEASE NOTE THAT DRAGON DICTATION SOFTWARE WAS USED IN THE CONSTRUCTION OF THIS NOTE.   Person [facility] Pager [phone] From [place]  Otherwise, please contact night-coverage  [email or web address] Password [email or web address]   03/31/2021, 9:15 PM

## 2021-04-01 ENCOUNTER — Inpatient Hospital Stay (HOSPITAL_COMMUNITY): Payer: Medicare Other

## 2021-04-01 DIAGNOSIS — Z87891 Personal history of nicotine dependence: Secondary | ICD-10-CM | POA: Diagnosis not present

## 2021-04-01 DIAGNOSIS — K219 Gastro-esophageal reflux disease without esophagitis: Secondary | ICD-10-CM | POA: Diagnosis present

## 2021-04-01 DIAGNOSIS — E860 Dehydration: Secondary | ICD-10-CM | POA: Diagnosis present

## 2021-04-01 DIAGNOSIS — I671 Cerebral aneurysm, nonruptured: Secondary | ICD-10-CM | POA: Diagnosis present

## 2021-04-01 DIAGNOSIS — Z833 Family history of diabetes mellitus: Secondary | ICD-10-CM | POA: Diagnosis not present

## 2021-04-01 DIAGNOSIS — R55 Syncope and collapse: Secondary | ICD-10-CM | POA: Diagnosis present

## 2021-04-01 DIAGNOSIS — R195 Other fecal abnormalities: Secondary | ICD-10-CM | POA: Diagnosis present

## 2021-04-01 DIAGNOSIS — Z803 Family history of malignant neoplasm of breast: Secondary | ICD-10-CM | POA: Diagnosis not present

## 2021-04-01 DIAGNOSIS — D67 Hereditary factor IX deficiency: Secondary | ICD-10-CM | POA: Diagnosis present

## 2021-04-01 DIAGNOSIS — Z823 Family history of stroke: Secondary | ICD-10-CM | POA: Diagnosis not present

## 2021-04-01 DIAGNOSIS — R197 Diarrhea, unspecified: Secondary | ICD-10-CM | POA: Diagnosis present

## 2021-04-01 DIAGNOSIS — Z20822 Contact with and (suspected) exposure to covid-19: Secondary | ICD-10-CM | POA: Diagnosis present

## 2021-04-01 DIAGNOSIS — N179 Acute kidney failure, unspecified: Secondary | ICD-10-CM | POA: Diagnosis present

## 2021-04-01 DIAGNOSIS — E872 Acidosis, unspecified: Secondary | ICD-10-CM | POA: Diagnosis present

## 2021-04-01 DIAGNOSIS — B182 Chronic viral hepatitis C: Secondary | ICD-10-CM | POA: Diagnosis present

## 2021-04-01 DIAGNOSIS — I959 Hypotension, unspecified: Secondary | ICD-10-CM | POA: Diagnosis present

## 2021-04-01 DIAGNOSIS — I472 Ventricular tachycardia: Secondary | ICD-10-CM | POA: Diagnosis present

## 2021-04-01 DIAGNOSIS — Z8249 Family history of ischemic heart disease and other diseases of the circulatory system: Secondary | ICD-10-CM | POA: Diagnosis not present

## 2021-04-01 DIAGNOSIS — I493 Ventricular premature depolarization: Secondary | ICD-10-CM | POA: Diagnosis present

## 2021-04-01 DIAGNOSIS — B1920 Unspecified viral hepatitis C without hepatic coma: Secondary | ICD-10-CM | POA: Diagnosis present

## 2021-04-01 DIAGNOSIS — K7469 Other cirrhosis of liver: Secondary | ICD-10-CM | POA: Diagnosis present

## 2021-04-01 DIAGNOSIS — E039 Hypothyroidism, unspecified: Secondary | ICD-10-CM | POA: Diagnosis present

## 2021-04-01 DIAGNOSIS — D649 Anemia, unspecified: Secondary | ICD-10-CM | POA: Diagnosis present

## 2021-04-01 DIAGNOSIS — Z96641 Presence of right artificial hip joint: Secondary | ICD-10-CM | POA: Diagnosis present

## 2021-04-01 DIAGNOSIS — R627 Adult failure to thrive: Secondary | ICD-10-CM | POA: Diagnosis present

## 2021-04-01 LAB — CBC WITH DIFFERENTIAL/PLATELET
Abs Immature Granulocytes: 0.02 10*3/uL (ref 0.00–0.07)
Basophils Absolute: 0 10*3/uL (ref 0.0–0.1)
Basophils Relative: 1 %
Eosinophils Absolute: 0.1 10*3/uL (ref 0.0–0.5)
Eosinophils Relative: 2 %
HCT: 26.3 % — ABNORMAL LOW (ref 39.0–52.0)
Hemoglobin: 8.7 g/dL — ABNORMAL LOW (ref 13.0–17.0)
Immature Granulocytes: 0 %
Lymphocytes Relative: 10 %
Lymphs Abs: 0.7 10*3/uL (ref 0.7–4.0)
MCH: 36.1 pg — ABNORMAL HIGH (ref 26.0–34.0)
MCHC: 33.1 g/dL (ref 30.0–36.0)
MCV: 109.1 fL — ABNORMAL HIGH (ref 80.0–100.0)
Monocytes Absolute: 0.7 10*3/uL (ref 0.1–1.0)
Monocytes Relative: 10 %
Neutro Abs: 5.1 10*3/uL (ref 1.7–7.7)
Neutrophils Relative %: 77 %
Platelets: 125 10*3/uL — ABNORMAL LOW (ref 150–400)
RBC: 2.41 MIL/uL — ABNORMAL LOW (ref 4.22–5.81)
RDW: 12.8 % (ref 11.5–15.5)
WBC: 6.6 10*3/uL (ref 4.0–10.5)
nRBC: 0 % (ref 0.0–0.2)

## 2021-04-01 LAB — HEMOGLOBIN AND HEMATOCRIT, BLOOD
HCT: 24.5 % — ABNORMAL LOW (ref 39.0–52.0)
HCT: 27.7 % — ABNORMAL LOW (ref 39.0–52.0)
Hemoglobin: 8.2 g/dL — ABNORMAL LOW (ref 13.0–17.0)
Hemoglobin: 9 g/dL — ABNORMAL LOW (ref 13.0–17.0)

## 2021-04-01 LAB — COMPREHENSIVE METABOLIC PANEL
ALT: 14 U/L (ref 0–44)
AST: 37 U/L (ref 15–41)
Albumin: 2.2 g/dL — ABNORMAL LOW (ref 3.5–5.0)
Alkaline Phosphatase: 58 U/L (ref 38–126)
Anion gap: 2 — ABNORMAL LOW (ref 5–15)
BUN: 30 mg/dL — ABNORMAL HIGH (ref 8–23)
CO2: 23 mmol/L (ref 22–32)
Calcium: 7.6 mg/dL — ABNORMAL LOW (ref 8.9–10.3)
Chloride: 111 mmol/L (ref 98–111)
Creatinine, Ser: 0.83 mg/dL (ref 0.61–1.24)
GFR, Estimated: >60 mL/min (ref >60)
Glucose, Bld: 89 mg/dL (ref 70–99)
Potassium: 4.3 mmol/L (ref 3.5–5.1)
Sodium: 136 mmol/L (ref 135–145)
Total Bilirubin: 1 mg/dL (ref 0.3–1.2)
Total Protein: 4.9 g/dL — ABNORMAL LOW (ref 6.5–8.1)

## 2021-04-01 LAB — PROTIME-INR
INR: 1.4 — ABNORMAL HIGH (ref 0.8–1.2)
Prothrombin Time: 16.7 seconds — ABNORMAL HIGH (ref 11.4–15.2)

## 2021-04-01 LAB — LACTIC ACID, PLASMA: Lactic Acid, Venous: 1.2 mmol/L (ref 0.5–1.9)

## 2021-04-01 LAB — MAGNESIUM: Magnesium: 1.7 mg/dL (ref 1.7–2.4)

## 2021-04-01 LAB — PREPARE RBC (CROSSMATCH)

## 2021-04-01 LAB — OCCULT BLOOD X 1 CARD TO LAB, STOOL: Fecal Occult Bld: NEGATIVE

## 2021-04-01 MED ORDER — COAGULATION FACTOR IX (RECOMB) 1000 UNITS IV KIT
7960.0000 [IU] | PACK | Freq: Once | INTRAVENOUS | Status: AC
  Administered 2021-04-01: 7960 [IU] via INTRAVENOUS
  Filled 2021-04-01: qty 7960

## 2021-04-01 MED ORDER — SODIUM CHLORIDE 0.9 % IV BOLUS
1000.0000 mL | Freq: Once | INTRAVENOUS | Status: AC
  Administered 2021-04-01: 1000 mL via INTRAVENOUS

## 2021-04-01 MED ORDER — SENNOSIDES-DOCUSATE SODIUM 8.6-50 MG PO TABS
1.0000 | ORAL_TABLET | Freq: Every day | ORAL | Status: DC
  Filled 2021-04-01: qty 1

## 2021-04-01 MED ORDER — MAGNESIUM SULFATE 2 GM/50ML IV SOLN
2.0000 g | Freq: Once | INTRAVENOUS | Status: AC
  Administered 2021-04-01: 2 g via INTRAVENOUS
  Filled 2021-04-01: qty 50

## 2021-04-01 MED ORDER — SODIUM CHLORIDE 0.9% IV SOLUTION: Freq: Once | INTRAVENOUS | Status: AC

## 2021-04-01 NOTE — Progress Notes (Signed)
PROGRESS NOTE    Justin Hampton  KWI:[id number] DOB: September 25, 1946 DOA: 03/31/2021 PCP: Bartholome Bill, MD    Chief Complaint  Patient presents with   Hypotension    Brief Narrative:  h/o brain aneurysm, hypothyroidism, transfusion acquired hepatitis C infection (treated), hepc cirrhosis,  h/o hemophilia B, most recently started on factor IX injection, for periopeative management  , total hip replacement on 6/6 at [facility]. He was instructed to take lovenox injection daily for 4 weeks.  He had a PICC line placed before discharge for home BeneFIX infusion.  Pt was d[facility]c on 6/[facility] from [facility] , Pts wife called EMS to help get pt into house. On EMS arrival pt was weak, lethargic, hypotensive at 52/30. EMS gave 300 cc fluids, pressure increased to 96/52.    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)   Hypothyroidism (acquired)   Gastroesophageal reflux disease   Hypotension   Lactic acidosis   Loose stools   AKI (acute kidney injury) ([place])  Hypotension, lactic acidosis, AKI -No apparent source of infection, blood culture no growth, chest x-ray no acute infiltrate, UA no suggestion of infection, COVID screening negative -he does appear dehydrated, no overt sign of external bleeding , no significant current edema right hip area , no diarrhea since in the hospital  -Hold home medication spironolactone -renal function and lactic acid has improved on repeat, however persistent hypotension despite multiple fluids  -he is getting hydration, check a.m. cortisol level  Acute anemia No overt sign of external bleeding, reports stool is brown, surgical site does not appear to be swollen  Check fobt Hgb 10.4-8.7-8.2 - will get CT abdomen  pelvis without contrast to rule out retroperitoneal hematoma 1 units PRBC transfusion Monitor hgb  Hemophilia B With acute anemia/hypotension, 3 days postop He was sent home on Lovenox injection Case discussed with hematology at [facility] hospital Dr Hart Robinsons and associated Lauren who recommend game factor IX 8000 unit x 1, agreed with PRBC transfusion x1, patient is accepted transfer to Transfer to [facility] hematology service A Accepting physician Dr Cyril Loosen    NSVT 5 minutes of NSVT on monitor, he denies symptom keep potassium above 4, magnesium above 2 Mag 1.7 today, will give IV mag 2 g x 1 now, keep on telemetry  Hep C cirrhosis Transfusion acquired hep C, treated in the past Appear to be spironolactone, lactulose at home There is no confusion He denies abdominal pain He appears dehydrated Hold spironolactone Hold lactulose for now due to report of diarrhea, though no BM since in the hospital Likely able to resume lactulose tomorrow  Hypothyroidism Continue Synthroid   Body mass index is 29.13 kg/m.Marland Kitchen     Unresulted Labs (From admission, onward)     Start     Ordered   04/02/21 0500  Cortisol  Tomorrow morning,   R       Question:  Specimen collection method  Answer:  IV Team=IV Team collect   04/01/21 1038   03/31/21 2157  Urinalysis, Complete w Microscopic  Add-on,   AD        03/31/21 2156   03/31/21 1718  Urine Culture  Once,   STAT  03/31/21 1719              DVT prophylaxis: enoxaparin (LOVENOX) injection 40 mg Start: 04/01/21 0900 SCDs Start: 03/31/21 2114   Code Status: Full Family Communication: sister over  the phone Disposition:   Status is: inpatient     Dispo: The patient is from: home              Anticipated d/c is to: transfer to higher level of care               Consultants:  Hematology Dr. Hart Robinsons at [facility] hospital  Procedures:  PRBC transfusion x1  Antimicrobials:    Anti-infectives (From admission, onward)    None           Objective: Vitals:   04/01/21 0902 04/01/21 0905 04/01/21 0910 04/01/21 1026  BP: (!) 86/70 (!) 84/48 (!) 85/50 (!) 89/54  Pulse: (!) 122 (!) 108 93 91  Resp:   18 16  Temp:   99.4 F (37.4 C) 98.2 F (36.8 C)  TempSrc:   Oral   SpO2:   96% 97%  Weight:      Height:        Intake/Output Summary (Last 24 hours) at 04/01/2021 1039 Last data filed at 04/01/2021 0600 Gross per 24 hour  Intake 1800 ml  Output --  Net 1800 ml   Filed Weights   03/31/21 1656  Weight: 84.4 kg    Examination:  General exam: Pale, frail , does not appear in acute distress, +right upper extremity PICC line Respiratory system: Clear to auscultation. Respiratory effort normal. Cardiovascular system: S1 & S2 heard, RRR. No JVD, no murmur, No pedal edema. Gastrointestinal system: Abdomen is nondistended, soft and nontender.  Normal bowel sounds heard. Central nervous system: Alert and oriented. No focal neurological deficits. Extremities: Generalized weakness, no focal deficit, right hip postop changes, no significant edema, no erythema, does not appear to be tender Skin: No rashes, lesions or ulcers Psychiatry: Judgement and insight appear normal. Mood & affect appropriate.     Data Reviewed: I have personally reviewed following labs and imaging studies  CBC: Recent Labs  Lab 03/31/21 1752 04/01/21 0423  WBC 9.5 6.6  NEUTROABS 7.8* 5.1  HGB 10.4* 8.7*  HCT 32.0* 26.3*  MCV 110.0* 109.1*  PLT 149* 125*    Basic Metabolic Panel: Recent Labs  Lab 03/31/21 1752 04/01/21 0423  NA 135 136  K 4.9 4.3  CL 105 111  CO2 23 23  GLUCOSE 110* 89  BUN 39* 30*  CREATININE 1.30* 0.83  CALCIUM 8.5* 7.6*  MG 2.2 1.7    GFR: Estimated Creatinine Clearance: 79.8 mL/min (by C-G formula based on SCr of 0.83 mg/dL).  Liver Function Tests: Recent Labs  Lab 03/31/21 1752 04/01/21 0423  AST 40 37  ALT 15 14  ALKPHOS 68 58   BILITOT 1.2 1.0  PROT 5.7* 4.9*  ALBUMIN 2.6* 2.2*    CBG: No results for input(s): GLUCAP in the last 168 hours.   Recent Results (from the past 240 hour(s))  Blood culture (routine x 2)     Status: None (Preliminary result)   Collection Time: 03/31/21  6:00 PM   Specimen: BLOOD  Result Value Ref Range Status   Specimen Description   Final    BLOOD LEFT ANTECUBITAL Performed at [facility] [address] [place], [place] [address]    Special Requests   Final    BOTTLES  DRAWN AEROBIC AND ANAEROBIC Blood Culture adequate volume Performed at [facility] [address] [place], [place] [address]    Culture   Final    NO GROWTH < 12 HOURS Performed at [facility] [address], [place], [place] [address]    Report Status PENDING  Incomplete  Blood culture (routine x 2)     Status: None (Preliminary result)   Collection Time: 03/31/21  6:00 PM   Specimen: Left Antecubital; Blood  Result Value Ref Range Status   Specimen Description   Final    LEFT ANTECUBITAL Performed at [facility] [address] [place], [place] [address]    Special Requests   Final    BOTTLES DRAWN AEROBIC AND ANAEROBIC Blood Culture adequate volume Performed at [facility] [address] [place], [place] [address]    Culture   Final    NO GROWTH < 12 HOURS Performed at [facility] [address] [place], [place] [address]    Report Status PENDING  Incomplete  Resp Panel by RT-PCR (Flu A&B, Covid) Nasopharyngeal Swab     Status: None   Collection Time: 03/31/21  7:00 PM   Specimen: Nasopharyngeal Swab; Nasopharyngeal(NP) swabs in vial transport medium  Result Value Ref Range Status   SARS Coronavirus 2 by RT PCR NEGATIVE NEGATIVE Final    Comment: (NOTE) SARS-CoV-2 target nucleic acids are NOT DETECTED.  The SARS-CoV-2 RNA is generally detectable in upper respiratory specimens during the  acute phase of infection. The lowest concentration of SARS-CoV-2 viral copies this assay can detect is 138 copies/mL. A negative result does not preclude SARS-Cov-2 infection and should not be used as the sole basis for treatment or other patient management decisions. A negative result may occur with  improper specimen collection/handling, submission of specimen other than nasopharyngeal swab, presence of viral mutation(s) within the areas targeted by this assay, and inadequate number of viral copies(<138 copies/mL). A negative result must be combined with clinical observations, patient history, and epidemiological information. The expected result is Negative.  Fact Sheet for Patients:  EntrepreneurPulse.com.au  Fact Sheet for Healthcare Providers:  IncredibleEmployment.be  This test is no t yet approved or cleared by the [place] FDA and  has been authorized for detection and/or diagnosis of SARS-CoV-2 by FDA under an Emergency Use Authorization (EUA). This EUA will remain  in effect (meaning this test can be used) for the duration of the COVID-19 declaration under Section 564(b)(1) of the Act, 21 U.S.C.section 360bbb-3(b)(1), unless the authorization is terminated  or revoked sooner.       Influenza A by PCR NEGATIVE NEGATIVE Final   Influenza B by PCR NEGATIVE NEGATIVE Final    Comment: (NOTE) The Xpert Xpress SARS-CoV-2/FLU/RSV plus assay is intended as an aid in the diagnosis of influenza from Nasopharyngeal swab specimens and should not be used as a sole basis for treatment. Nasal washings and aspirates are unacceptable for Xpert Xpress SARS-CoV-2/FLU/RSV testing.  Fact Sheet for Patients: EntrepreneurPulse.com.au  Fact Sheet for Healthcare Providers: IncredibleEmployment.be  This test is not yet approved or cleared by the [place] FDA and has been authorized for detection and/or  diagnosis of SARS-CoV-2 by FDA under an Emergency Use Authorization (EUA). This EUA will remain in effect (meaning this test can be used) for the duration of the COVID-19 declaration under Section 564(b)(1) of the Act, 21 U.S.C. section 360bbb-3(b)(1), unless the authorization is terminated or revoked.  Performed at  [facility], [place] [address], [place], [place] [address]          Radiology Studies: DG Chest Portable 1 View  Result Date: 03/31/2021 CLINICAL DATA:  Weakness. EXAM: PORTABLE CHEST 1 VIEW COMPARISON:  07/13/2018 FINDINGS: Right upper extremity PICC tip in the upper SVC. Chronic hyperinflation. No focal airspace disease. Stable heart size and mediastinal contours. No pleural fluid or pneumothorax. No pulmonary edema. No acute osseous abnormalities are seen. IMPRESSION: 1. Chronic hyperinflation without acute abnormality. 2. Right upper extremity PICC tip in the upper SVC. Electronically Signed   By: Keith Rake M.D.   On: 03/31/2021 18:06        Scheduled Meds:  Chlorhexidine Gluconate Cloth  6 each Topical Daily   enoxaparin  40 mg Subcutaneous Q24H   levothyroxine  50 mcg Oral Q0600   Continuous Infusions:  sodium chloride 1,000 mL (04/01/21 1013)   sodium chloride       LOS: 0 days   Time spent: 76mins Greater than 50% of this time was spent in counseling, explanation of diagnosis, planning of further management, and coordination of care.   Voice Recognition Viviann Spare dictation system was used to create this note, attempts have been made to correct errors. Please contact the author with questions and/or clarifications.   Florencia Reasons, MD PhD FACP [facility]  Available via Epic secure chat 7am-7pm for nonurgent issues Please page for urgent issues To page the attending provider between 7A-7P or the covering provider during after hours 7P-7A, please log into the web site [email or web address] and access using universal  password  for that web site. If you do not have the password, please call the hospital operator.    04/01/2021, 10:39 AM

## 2021-04-01 NOTE — Plan of Care (Signed)

## 2021-04-02 LAB — CBC
HCT: 27.1 % — ABNORMAL LOW (ref 39.0–52.0)
Hemoglobin: 8.9 g/dL — ABNORMAL LOW (ref 13.0–17.0)
MCH: 34.6 pg — ABNORMAL HIGH (ref 26.0–34.0)
MCHC: 32.8 g/dL (ref 30.0–36.0)
MCV: 105.4 fL — ABNORMAL HIGH (ref 80.0–100.0)
Platelets: 125 10*3/uL — ABNORMAL LOW (ref 150–400)
RBC: 2.57 MIL/uL — ABNORMAL LOW (ref 4.22–5.81)
RDW: 14.8 % (ref 11.5–15.5)
WBC: 6.2 10*3/uL (ref 4.0–10.5)
nRBC: 0 % (ref 0.0–0.2)

## 2021-04-02 LAB — BPAM RBC
Blood Product Expiration Date: 202207112359
ISSUE DATE / TIME: 202206091613
Unit Type and Rh: 5100

## 2021-04-02 LAB — TYPE AND SCREEN
ABO/RH(D): O POS
Antibody Screen: NEGATIVE
Unit division: 0

## 2021-04-02 LAB — BASIC METABOLIC PANEL
Anion gap: 3 — ABNORMAL LOW (ref 5–15)
BUN: 25 mg/dL — ABNORMAL HIGH (ref 8–23)
CO2: 22 mmol/L (ref 22–32)
Calcium: 7.4 mg/dL — ABNORMAL LOW (ref 8.9–10.3)
Chloride: 109 mmol/L (ref 98–111)
Creatinine, Ser: 0.73 mg/dL (ref 0.61–1.24)
GFR, Estimated: >60 mL/min (ref >60)
Glucose, Bld: 108 mg/dL — ABNORMAL HIGH (ref 70–99)
Potassium: 4.1 mmol/L (ref 3.5–5.1)
Sodium: 134 mmol/L — ABNORMAL LOW (ref 135–145)

## 2021-04-02 LAB — URINE CULTURE: Culture: NO GROWTH

## 2021-04-02 LAB — CORTISOL
Cortisol, Plasma: 28 ug/dL
Cortisol, Plasma: 27.9 ug/dL

## 2021-04-02 LAB — MAGNESIUM: Magnesium: 2 mg/dL (ref 1.7–2.4)

## 2021-04-02 LAB — HEMOGLOBIN AND HEMATOCRIT, BLOOD
HCT: 28.8 % — ABNORMAL LOW (ref 39.0–52.0)
Hemoglobin: 9.5 g/dL — ABNORMAL LOW (ref 13.0–17.0)

## 2021-04-02 MED ORDER — COAGULATION FACTOR IX (RECOMB) 1000 UNITS IV KIT
4200.0000 [IU] | PACK | Freq: Once | INTRAVENOUS | Status: DC
  Filled 2021-04-02: qty 4200

## 2021-04-02 MED ORDER — LACTULOSE 10 GM/15ML PO SOLN
10.0000 g | Freq: Two times a day (BID) | ORAL | Status: DC
  Administered 2021-04-02: 10 g via ORAL
  Filled 2021-04-02: qty 15

## 2021-04-02 MED ORDER — COAGULATION FACTOR IX (RECOMB) 1000 UNITS IV KIT: 4000.0000 [IU] | PACK | Freq: Once | INTRAVENOUS | Status: DC

## 2021-04-02 MED ORDER — COAGULATION FACTOR IX (RECOMB) 1000 UNITS IV KIT
4200.0000 [IU] | PACK | Freq: Once | INTRAVENOUS | Status: AC
  Administered 2021-04-02: 4200 [IU] via INTRAVENOUS
  Filled 2021-04-02 (×2): qty 4200

## 2021-04-02 MED ORDER — HEPARIN SOD (PORK) LOCK FLUSH 100 UNIT/ML IV SOLN
250.0000 [IU] | INTRAVENOUS | Status: AC | PRN
  Administered 2021-04-02: 250 [IU]
  Filled 2021-04-02: qty 2.5

## 2021-04-02 MED ORDER — LEVOTHYROXINE SODIUM 50 MCG PO TABS: 50.0000 ug | ORAL_TABLET | Freq: Every day | ORAL | Status: DC

## 2021-04-02 NOTE — Progress Notes (Signed)
Physical Therapy Treatment Patient Details Name: Justin Hampton MRN: [id number] DOB: 27-Nov-1945 Today's Date: 04/02/2021    History of Present Illness Pt admitted with near syncopal episode 2* hypotension while attempting to enter home after release from [facility] 03/31/21.  Pt s/p R THR by posterior approach at [facility] 03/29/21.  Pt with hx of Hemophilia, Cirhossis, hepatic encephalopathy, vascular dementia, and cervical spinal stenosis    PT Comments    Pt assisted up from commode seat and requiring assist to complete perianal hygiene.  Pt to sink with steady assist to wash hands and then ambulated limited distance to bedside with one episode of balance loss noted.   Follow Up Recommendations  Home health PT;SNF     Equipment Recommendations  None recommended by PT    Recommendations for Other Services OT consult     Precautions / Restrictions Precautions Precautions: Posterior Hip Precaution Comments: Pt s/p posterior THR on 03/29/21 at [facility] but states "oh they are not using those rules anymore" Restrictions Weight Bearing Restrictions: No Other Position/Activity Restrictions: WBAT per pt following THR 03/29/21    Mobility  Bed Mobility Overal bed mobility: Needs Assistance Bed Mobility: Sit to Supine     Supine to sit: Min guard     General bed mobility comments: increased time and use of bed rails    Transfers Overall transfer level: Needs assistance Equipment used: Rolling walker (2 wheeled) Transfers: Sit to/from Stand Sit to Stand: Min assist;From elevated surface         General transfer comment: cues for LE management and use of UEs to self assist.  Physical assist to bring wt up and fwd and to balance in standing  Ambulation/Gait Ambulation/Gait assistance: Mod assist Gait Distance (Feet): 18 Feet Assistive device: Rolling walker (2 wheeled) Gait Pattern/deviations: Step-to pattern;Decreased step length - right;Decreased step length -  left;Shuffle;Trunk flexed Gait velocity: decr   General Gait Details: cues for sequence, posture and position from RW; noted instability and LE buckling intermittently and LOB requiring assist to assist to avoid fall most notably on turning   Stairs             Wheelchair Mobility    Modified Rankin (Stroke Patients Only)       Balance Overall balance assessment: Needs assistance Sitting-balance support: No upper extremity supported;Feet supported Sitting balance-Leahy Scale: Good     Standing balance support: Bilateral upper extremity supported Standing balance-Leahy Scale: Poor                              Cognition Arousal/Alertness: Awake/alert Behavior During Therapy: WFL for tasks assessed/performed Overall Cognitive Status: Within Functional Limits for tasks assessed                                 General Comments: Pt with limited insight into limitations      Exercises      General Comments        Pertinent Vitals/Pain Pain Assessment: 0-10 Pain Score: 3  Pain Location: R HIP Pain Descriptors / Indicators: Aching;Sore Pain Intervention(s): Limited activity within patient's tolerance;Monitored during session    [place] expects to be discharged to:: Private residence Living Arrangements: Alone Available Help at Discharge: Family;Available 24 hours/day Type of Home: House Home Access: Stairs to enter Entrance Stairs-Rails: Right Home Layout: Able to live on main level with bedroom/bathroom;Two level Home  Equipment: Gilford Rile - 2 wheels;Bedside commode;Hospital bed Additional Comments: Pt states his sister will be assisting for a few weeks.  Pt has hospital bed on main level to eliminate need to get to second floor    Prior Function Level of Independence: Independent          PT Goals (current goals can now be found in the care plan section) Acute Rehab PT Goals Patient Stated Goal: HOME PT Goal  Formulation: With patient Time For Goal Achievement: 04/16/21 Potential to Achieve Goals: Good Progress towards PT goals: Progressing toward goals    Frequency    Min 5X/week      PT Plan Current plan remains appropriate    Co-evaluation              AM-PAC PT "6 Clicks" Mobility   Outcome Measure  Help needed turning from your back to your side while in a flat bed without using bedrails?: A Little Help needed moving from lying on your back to sitting on the side of a flat bed without using bedrails?: A Little Help needed moving to and from a bed to a chair (including a wheelchair)?: A Little Help needed standing up from a chair using your arms (e.g., wheelchair or bedside chair)?: A Little Help needed to walk in hospital room?: A Little Help needed climbing 3-5 steps with a railing? : A Lot 6 Click Score: 17    End of Session Equipment Utilized During Treatment: Gait belt Activity Tolerance: Patient tolerated treatment well;Patient limited by fatigue Patient left: in bed;with call bell/phone within reach;with bed alarm set Nurse Communication: Mobility status PT Visit Diagnosis: Difficulty in walking, not elsewhere classified (R26.2);Unsteadiness on feet (R26.81);Pain Pain - Right/Left: Right Pain - part of body: Hip     Time: 7106-2694 PT Time Calculation (min) (ACUTE ONLY): 16 min  Charges:  $Gait Training: 8-22 mins $Therapeutic Activity: 8-22 mins                     Echo Pager [phone] Office [phone]    Makesha Belitz 04/02/2021, 4:54 PM

## 2021-04-02 NOTE — Progress Notes (Signed)
Discharged patient to [facility], VS taken. Transported via [place].

## 2021-04-02 NOTE — Progress Notes (Signed)
Page sent to PT department.

## 2021-04-02 NOTE — Plan of Care (Signed)

## 2021-04-02 NOTE — Progress Notes (Signed)
Patient for discharge to [facility]. Bed available, report given to receiving RN, Jearld Shines. [facility] transport set up by Financial controller.

## 2021-04-02 NOTE — Discharge Summary (Signed)
Discharge Summary  Justin Hampton GMW:[id number] DOB: [birth year]  PCP: Bartholome Bill, MD  Admit date: 03/31/2021 Discharge date: 04/02/2021  Time spent: 97mins, more than 50% time spent on coordination of care.   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    Discharge Condition: stable  Diet recommendation: Regular diet  Filed Weights   03/31/21 1656 04/02/21 0500  Weight: 84.4 kg 93.5 kg    History of present illness:  h/o brain aneurysm, hypothyroidism, transfusion acquired hepatitis C infection (treated), hepc cirrhosis,  h/o hemophilia B, most recently started on factor IX injection, for periopeative management  , total hip replacement on 6/6 at [facility]. He was instructed to take lovenox injection daily for 4 weeks.  He had a PICC line placed before discharge for home BeneFIX infusion.   Pt was d[facility]c on 6[facility] from [facility] , Pts wife called EMS to help get pt into house. On EMS arrival pt was weak, lethargic, hypotensive at 52/30. EMS gave 300 cc fluids, pressure increased to 96/52.  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)   Hypothyroidism (acquired)   Gastroesophageal reflux disease   Hypotension   Lactic acidosis   Loose stools   AKI (acute kidney injury) ([place])   Syncope   Hypotension, lactic acidosis, AKI -No apparent  source of infection, blood culture no growth, chest x-ray no acute infiltrate, UA no suggestion of infection, COVID screening negative -he does appear dehydrated, no overt sign of external bleeding , no significant current edema right hip area , no diarrhea since in the hospital -Hold home medication spironolactone -renal function and lactic acid has improved on repeat -he has been hypotensive most of day on 6/9 , blood pressure has finally improved 6/9 evening after fluids bolus x4 liters and continuous hydration and PRBC transfusion -A.m. cortisol level unremarkable     Acute anemia No overt sign of external bleeding, reports stool is brown, surgical site does not appear to be swollen  -fobt negative  -CT abdomen pelvis without contrast negative for retroperitoneal hematoma -Hgb prior to prbc transfusion :10.4-8.7-8.2 -S/p 1 units PRBC transfusion on 6/9 , hgb post transfusion around 9   Hemophilia B With acute anemia/hypotension, 3 days postop, He was sent home on Lovenox injection Case discussed with hematology at [facility] hospital Dr Hart Robinsons and associated Lauren who recommend factor IX 8000 unit x 1 on 6/9 , agreed with PRBC transfusion x1, patient was  accepted to Transfer to [facility]  on 6/9 to hematology service A, Accepting physician Dr Cyril Loosen Clinically improved on 6/10,  plan to discharge home on 6/10 however he is too weak to work with physical therapy, discharge canceled Discussed with Dr. Sherral Hammers and Mickel Baas again today recommend gave factor IX 4000 unit today,on 6/10 , continue hold Lovenox Continue to be very [facility],  not able to safely discharge home, not able to discharge to snf due to factor IX infusion daily, I was told there is a bed ready to him today at [facility], will proceed with transferred ,patient and sister agreed to transfer, Dr Sherral Hammers made aware.       NSVT  x2 on tele he denies symptom keep potassium above 4, magnesium above 2 keep on  telemetry   Hep C cirrhosis Transfusion acquired hep C, treated in the past Appear to be spironolactone, lactulose at home There is no confusion He denies abdominal pain He appears dehydrated Hold spironolactone Hold lactulose for now due to report of diarrhea, though no BM since in the hospital  resume lactulose at lower dose, monitor    Hypothyroidism Continue Synthroid   FTT: Pt eval    Body mass index is 32.28 kg/m.Marland Kitchen   DVT prophylaxis: SCDs Start: 03/31/21 2114     Code Status: Full Family Communication: sister over  the phone on 6/9 and 6/10   Consultants:  Hematology Dr. Hart Robinsons at [facility] hospital   Procedures:  PRBC transfusion x1   Antimicrobials:  Anti-infectives (From admission, onward)    None         Discharge Exam: BP 137/76   Pulse 85   Temp 97.6 F (36.4 C)   Resp 18   Ht 5\' 7"  (1.702 m)   Wt 93.5 kg   SpO2 99%   BMI 32.28 kg/m   General exam: Pale, frail , does not appear in acute distress, +right upper extremity PICC line Respiratory system: Clear to auscultation. Respiratory effort normal. Cardiovascular system: S1 & S2 heard, RRR. No JVD, no murmur, No pedal edema. Gastrointestinal system: Abdomen is nondistended, soft and nontender.  Normal bowel sounds heard. Central nervous system: Alert and oriented. No focal neurological deficits. Extremities: Generalized weakness, no focal deficit, right hip postop changes, no significant edema, no erythema, does not appear to be tender Skin: No rashes, lesions or ulcers Psychiatry: Judgement and insight appear normal. Mood & affect appropriate.   Discharge Instructions You were cared for by a hospitalist during your hospital stay. If you have any questions about your discharge medications or the care you received while you were in the hospital after you are discharged, you can call the unit and asked to speak with the hospitalist on call if the hospitalist that took care of  you is not available. Once you are discharged, your primary care physician will handle any further medical issues. Please note that NO REFILLS for any discharge medications will be authorized once you are discharged, as it is imperative that you return to your primary care physician (or establish a relationship with a primary care physician if you do not have one) for your aftercare needs so that they can reassess your need for medications and monitor your lab values.  Discharge Instructions     Diet general   Complete by: As directed    Increase activity slowly   Complete by: As directed       Allergies as of 04/02/2021       Reactions   Morphine Other (See Comments)   Caused confusion/During appendectomy   Zolpidem Other (See Comments)   Confusion        Medication List     STOP taking these medications    enoxaparin 40 MG/0.4ML injection Commonly known as: LOVENOX   lactulose 10 GM/15ML solution Commonly known as: CHRONULAC  TAKE these medications    acetaminophen 500 MG tablet Commonly known as: TYLENOL Take 500 mg by mouth every 6 (six) hours as needed for mild pain.   albuterol 108 (90 Base) MCG/ACT inhaler Commonly known as: VENTOLIN HFA Inhale 1-2 puffs into the lungs every 6 (six) hours as needed for wheezing or shortness of breath.   AQUAPHOR EX Apply 1 application topically 2 (two) times daily as needed (for dry skin).   coagulation factor IX (recomb) 1000 units injection Commonly known as: BENEFIX Inject 4,000 Units into the vein See admin instructions. Whenever having surgery  As directed by hematologist   HYDROcodone-acetaminophen 5-325 MG tablet Commonly known as: NORCO/VICODIN Take 1 tablet by mouth every 6 (six) hours as needed for moderate pain or severe pain.   lactulose (encephalopathy) 10 GM/15ML Soln Commonly known as: CHRONULAC Take 30 g by mouth in the morning, at noon, and at bedtime.   levothyroxine 50 MCG tablet Commonly  known as: SYNTHROID Take 50 mcg by mouth daily.   magnesium oxide 400 (241.3 Mg) MG tablet Commonly known as: MAG-OX Take 400 mg by mouth daily.   pantoprazole 40 MG tablet Commonly known as: PROTONIX Take 40 mg by mouth daily.   spironolactone 25 MG tablet Commonly known as: ALDACTONE Take 25 mg by mouth daily.   traMADol 50 MG tablet Commonly known as: ULTRAM Take 50 mg by mouth every 8 (eight) hours as needed for moderate pain or severe pain.       Allergies  Allergen Reactions   Morphine Other (See Comments)    Caused confusion/During appendectomy   Zolpidem Other (See Comments)    Confusion     Follow-up Information     Bartholome Bill, MD Follow up.   Specialty: Family Medicine Contact information: [place] [address] [phone]         Hart Robinsons, MD Follow up in 1 week(s).   Specialty: Hematology and Oncology Contact information: [address] [place] [address] [phone]                  The results of significant diagnostics from this hospitalization (including imaging, microbiology, ancillary and laboratory) are listed below for reference.    Significant Diagnostic Studies: CT ABDOMEN PELVIS WO CONTRAST  Result Date: 04/01/2021 CLINICAL DATA:  Evaluate for retroperitoneal hematoma. History of hepatitis C and cirrhosis. Gastroesophageal reflux disease. EXAM: CT ABDOMEN AND PELVIS WITHOUT CONTRAST TECHNIQUE: Multidetector CT imaging of the abdomen and pelvis was performed following the standard protocol without IV contrast. COMPARISON:  10/02/2018 abdominal ultrasound. No prior CT. FINDINGS: Lower chest: Clear lung bases. Normal heart size with trace left larger than right pleural effusions. Hypoattenuation in the intravascular space suggests anemia. Incompletely imaged moderate to marked right-sided gynecomastia. Hepatobiliary: Mild cirrhosis, as evidenced by enlargement of the  lateral segment left and caudate lobes. Scattered hepatic cysts and too small to characterize lesions. Abnormal appearance of the hepatic dome with an area of hypoattenuation on the order of 3.7 x 2.3 cm appearing to contact both the middle hepatic vein and a branch of the left portal vein. The left portal vein is prominent including on 17/2. Normal gallbladder, without biliary ductal dilatation. Pancreas: Normal pancreas for age, without duct dilatation or acute inflammation. Spleen: Normal in size, without focal abnormality. Adrenals/Urinary Tract: Normal adrenal glands. No renal calculi or hydronephrosis. Interpolar right renal 2.9 cm fluid density lesion is likely a cyst. Degraded evaluation  of the pelvis, secondary to beam hardening artifact from right hip arthroplasty. Foci of hyperattenuation within the dependent bladder including on 68/2 of up to 11 mm. Stomach/Bowel: Normal stomach, without wall thickening. Scattered colonic diverticula. Normal terminal ileum. Appendix not visualized and surgically absent by history. Normal small bowel. Vascular/Lymphatic: Aortic atherosclerosis. Please see discussion of the left portal vein above. No abdominopelvic adenopathy. Reproductive: Grossly normal prostate. Other: No free intraperitoneal air. Trace pelvic fluid. Diminutive fat containing ventral abdominal wall hernia. Musculoskeletal: Right hip arthroplasty. Left hip osteoarthritis. Lumbosacral spondylosis. IMPRESSION: 1. No evidence of retroperitoneal hemorrhage. 2. Degraded exam, secondary to lack of oral or IV contrast. 3. Mild cirrhosis. Abnormal appearance of the hepatic dome and left portal vein for which portal vein thrombus and underlying tumor are considerations. Consider further evaluation with liver protocol pre and postcontrast CT. Alternatively, pre and post contrast MRI could be performed, but should be withheld until the patient can breath hold and follow directions. 4. Tiny bilateral pleural  effusions. 5. Right-sided gynecomastia. 6. Dependent hyperattenuating foci in the bladder are likely stones. 7. Degraded evaluation of the pelvis, secondary to beam hardening artifact from right hip arthroplasty. Electronically Signed   By: Abigail Miyamoto M.D.   On: 04/01/2021 18:20   DG Chest Portable 1 View  Result Date: 03/31/2021 CLINICAL DATA:  Weakness. EXAM: PORTABLE CHEST 1 VIEW COMPARISON:  07/13/2018 FINDINGS: Right upper extremity PICC tip in the upper SVC. Chronic hyperinflation. No focal airspace disease. Stable heart size and mediastinal contours. No pleural fluid or pneumothorax. No pulmonary edema. No acute osseous abnormalities are seen. IMPRESSION: 1. Chronic hyperinflation without acute abnormality. 2. Right upper extremity PICC tip in the upper SVC. Electronically Signed   By: Keith Rake M.D.   On: 03/31/2021 18:06    Microbiology: Recent Results (from the past 240 hour(s))  Blood culture (routine x 2)     Status: None (Preliminary result)   Collection Time: 03/31/21  6:00 PM   Specimen: BLOOD  Result Value Ref Range Status   Specimen Description   Final    BLOOD LEFT ANTECUBITAL Performed at [facility] [address], [place], [place] [address]    Special Requests   Final    BOTTLES DRAWN AEROBIC AND ANAEROBIC Blood Culture adequate volume Performed at [facility] [address], [place], [place] [address]    Culture   Final    NO GROWTH 2 DAYS Performed at [facility] [address] [place], [place] [address]    Report Status PENDING  Incomplete  Blood culture (routine x 2)     Status: None (Preliminary result)   Collection Time: 03/31/21  6:00 PM   Specimen: Left Antecubital; Blood  Result Value Ref Range Status   Specimen Description   Final    LEFT ANTECUBITAL Performed at [facility] [address], [place], [place] [address]    Special Requests   Final    BOTTLES DRAWN AEROBIC AND  ANAEROBIC Blood Culture adequate volume Performed at [facility] [address], [place], [place] [address]    Culture   Final    NO GROWTH 2 DAYS Performed at [facility] [address] [place], [place] [address]    Report Status PENDING  Incomplete  Resp Panel by RT-PCR (Flu A&B, Covid) Nasopharyngeal Swab     Status: None   Collection Time: 03/31/21  7:00 PM   Specimen: Nasopharyngeal Swab; Nasopharyngeal(NP) swabs  in vial transport medium  Result Value Ref Range Status   SARS Coronavirus 2 by RT PCR NEGATIVE NEGATIVE Final    Comment: (NOTE) SARS-CoV-2 target nucleic acids are NOT DETECTED.  The SARS-CoV-2 RNA is generally detectable in upper respiratory specimens during the acute phase of infection. The lowest concentration of SARS-CoV-2 viral copies this assay can detect is 138 copies/mL. A negative result does not preclude SARS-Cov-2 infection and should not be used as the sole basis for treatment or other patient management decisions. A negative result may occur with  improper specimen collection/handling, submission of specimen other than nasopharyngeal swab, presence of viral mutation(s) within the areas targeted by this assay, and inadequate number of viral copies(<138 copies/mL). A negative result must be combined with clinical observations, patient history, and epidemiological information. The expected result is Negative.  Fact Sheet for Patients:  EntrepreneurPulse.com.au  Fact Sheet for Healthcare Providers:  IncredibleEmployment.be  This test is no t yet approved or cleared by the [place] FDA and  has been authorized for detection and/or diagnosis of SARS-CoV-2 by FDA under an Emergency Use Authorization (EUA). This EUA will remain  in effect (meaning this test can be used) for the duration of the COVID-19 declaration under Section 564(b)(1) of the Act, 21 U.S.C.section 360bbb-3(b)(1),  unless the authorization is terminated  or revoked sooner.       Influenza A by PCR NEGATIVE NEGATIVE Final   Influenza B by PCR NEGATIVE NEGATIVE Final    Comment: (NOTE) The Xpert Xpress SARS-CoV-2/FLU/RSV plus assay is intended as an aid in the diagnosis of influenza from Nasopharyngeal swab specimens and should not be used as a sole basis for treatment. Nasal washings and aspirates are unacceptable for Xpert Xpress SARS-CoV-2/FLU/RSV testing.  Fact Sheet for Patients: EntrepreneurPulse.com.au  Fact Sheet for Healthcare Providers: IncredibleEmployment.be  This test is not yet approved or cleared by the [place] FDA and has been authorized for detection and/or diagnosis of SARS-CoV-2 by FDA under an Emergency Use Authorization (EUA). This EUA will remain in effect (meaning this test can be used) for the duration of the COVID-19 declaration under Section 564(b)(1) of the Act, 21 U.S.C. section 360bbb-3(b)(1), unless the authorization is terminated or revoked.  Performed at [facility], [place] [address] [place], [place] [address]   Urine Culture     Status: None   Collection Time: 03/31/21  7:23 PM   Specimen: Urine, Random  Result Value Ref Range Status   Specimen Description   Final    URINE, RANDOM Performed at [facility] [address] [place], [place] [address]    Special Requests   Final    NONE Performed at [facility], [place] [address] [place], [place] [address]    Culture   Final    NO GROWTH Performed at [facility], [place] [address] [place],  [address]    Report Status 04/02/2021 FINAL  Final     Labs: Basic Metabolic Panel: Recent Labs  Lab 03/31/21 1752 04/01/21 0423 04/02/21 0230  NA 135 136 134*  K 4.9 4.3 4.1  CL 105 111 109  CO2 23 23 22   GLUCOSE 950* 89 108*  BUN 39* 30* 25*  CREATININE 1.30* 0.83 0.73  CALCIUM 8.5* 7.6*  7.4*  MG 2.2 1.7 2.0   Liver Function Tests: Recent Labs  Lab 03/31/21 1752 04/01/21 0423  AST 40 37  ALT 15 14  ALKPHOS 68 58  BILITOT 1.2 1.0  PROT 5.7* 4.9*  ALBUMIN 2.6* 2.2*   No results for input(s): LIPASE, AMYLASE in the last 168 hours. Recent Labs  Lab 03/31/21 1752  AMMONIA 34   CBC: Recent Labs  Lab 03/31/21 1752 04/01/21 0423 04/01/21 1141 04/01/21 1959 04/02/21 0230 04/02/21 0800  WBC 9.5 6.6  --   --  6.2  --   NEUTROABS 7.8* 5.1  --   --   --   --   HGB 10.4* 8.7* 8.2* 9.0* 8.9* 9.5*  HCT 32.0* 26.3* 24.5* 27.7* 27.1* 28.8*  MCV 110.0* 109.1*  --   --  105.4*  --   PLT 149* 125*  --   --  125*  --    Cardiac Enzymes: No results for input(s): CKTOTAL, CKMB, CKMBINDEX, TROPONINI in the last 168 hours. BNP: BNP (last 3 results) No results for input(s): BNP in the last 8760 hours.  ProBNP (last 3 results) No results for input(s): PROBNP in the last 8760 hours.  CBG: No results for input(s): GLUCAP in the last 168 hours.     Signed:  Florencia Reasons MD, PhD, FACP  [facility] 04/02/2021, 7:24 PM

## 2021-04-02 NOTE — Progress Notes (Signed)
PROGRESS NOTE    Justin Hampton  MEQ:[id number] DOB: 1945-12-01 DOA: 03/31/2021 PCP: Bartholome Bill, MD    Chief Complaint  Patient presents with   Hypotension    Brief Narrative:  h/o brain aneurysm, hypothyroidism, transfusion acquired hepatitis C infection (treated), hepc cirrhosis,  h/o hemophilia B, most recently started on factor IX injection, for periopeative management  , total hip replacement on 6/6 at [facility]. He was instructed to take lovenox injection daily for 4 weeks.  He had a PICC line placed before discharge for home BeneFIX infusion.  Pt [facility] , Pts wife called EMS to help get pt into house. On EMS arrival pt was weak, lethargic, hypotensive at 52/30. EMS gave 300 cc fluids, pressure increased to 96/52.    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)   Hypothyroidism (acquired)   Gastroesophageal reflux disease   Hypotension   Lactic acidosis   Loose stools   AKI (acute kidney injury) ([place])   Syncope  Hypotension, lactic acidosis, AKI -No apparent source of infection, blood culture no growth, chest x-ray no acute infiltrate, UA no suggestion of infection, COVID screening negative -he does appear dehydrated, no overt sign of external bleeding , no significant current edema right hip area , no diarrhea since in the hospital  -Hold home medication spironolactone -renal function and lactic acid has improved on repeat -Blood pressure has finally improved after multiple fluids bolus and PRBC transfusion -A.m. cortisol level unremarkable   Acute anemia No  overt sign of external bleeding, reports stool is brown, surgical site does not appear to be swollen  fobt negative  -CT abdomen pelvis without contrast negative for retroperitoneal hematoma Hgb prior to prbc transfusion :10.4-8.7-8.2 -S/p 1 units PRBC transfusion on 6/9 , hgb post transfusion around 9  Hemophilia B With acute anemia/hypotension, 3 days postop He was sent home on Lovenox injection Case discussed with hematology at [facility] Dr Hart Robinsons and associated Lauren who recommend factor IX 8000 unit x 1, agreed with PRBC transfusion x1, patient was  accepted to Transfer to [facility]  on 6/9 to hematology service A Accepting physician Dr Cyril Loosen Clinically improved on 6/10,  plan to discharge home on 6/10 however he is too weak to work for his physical therapy, discharge canceled Discussed with Dr. Sherral Hammers and Mickel Baas again today recommend gave factor IX 4000 unit today, continue hold Lovenox Hopefully can go home with home health tomorrow if he were to become stronger, otherwise he might have to be transferred to [facility]    NSVT  x2 on tele he denies symptom keep potassium above 4, magnesium above 2 keep on telemetry  Hep C cirrhosis Transfusion acquired hep C, treated in the past Appear to be spironolactone, lactulose at home There is no confusion He denies abdominal pain He appears dehydrated Hold spironolactone Hold lactulose for now due to report of diarrhea, though no BM since in the hospital  resume lactulose at lower dose, monitor   Hypothyroidism Continue Synthroid  FTT: Pt eval   Body mass index is 32.28  kg/m..     Unresulted Labs (From admission, onward)     Start     Ordered   03/31/21 2157  Urinalysis, Complete w Microscopic  Add-on,   AD        03/31/21 2156   Unscheduled  Occult blood card to lab, stool  As needed,   R      04/01/21 1041              DVT prophylaxis: SCDs Start: 03/31/21 2114   Code  Status: Full Family Communication: sister over  the phone on 6/9 and 6/10 Disposition:   Status is: inpatient     Dispo: The patient is from: home              Anticipated d/c is to: home tomorrow or transfer to higher level of care pending PT reeval               Consultants:  Hematology Dr. Hart Robinsons at [facility] hospital  Procedures:  PRBC transfusion x1  Antimicrobials:   Anti-infectives (From admission, onward)    None           Objective: Vitals:   04/02/21 0010 04/02/21 0500 04/02/21 0901 04/02/21 1500  BP: 119/70  117/68 123/71  Pulse: 82  70 70  Resp: 18   18  Temp: 97.8 F (36.6 C)   98.2 F (36.8 C)  TempSrc: Oral   Oral  SpO2: 90%   96%  Weight:  93.5 kg    Height:        Intake/Output Summary (Last 24 hours) at 04/02/2021 1725 Last data filed at 04/02/2021 1500 Gross per 24 hour  Intake 1412.71 ml  Output 900 ml  Net 512.71 ml   Filed Weights   03/31/21 1656 04/02/21 0500  Weight: 84.4 kg 93.5 kg    Examination:  General exam: Pale, frail , does not appear in acute distress, +right upper extremity PICC line Respiratory system: Clear to auscultation. Respiratory effort normal. Cardiovascular system: S1 & S2 heard, RRR. No JVD, no murmur, No pedal edema. Gastrointestinal system: Abdomen is nondistended, soft and nontender.  Normal bowel sounds heard. Central nervous system: Alert and oriented. No focal neurological deficits. Extremities: Generalized weakness, no focal deficit, right hip postop changes, no significant edema, no erythema, does not appear to be tender Skin: No rashes, lesions or ulcers Psychiatry: Judgement and insight appear normal. Mood & affect appropriate.     Data Reviewed: I have personally reviewed following labs and imaging studies  CBC: Recent Labs  Lab 03/31/21 1752 04/01/21 0423 04/01/21 1141 04/01/21 1959 04/02/21 0230 04/02/21 0800  WBC 9.5 6.6  --   --  6.2  --   NEUTROABS 7.8* 5.1  --   --    --   --   HGB 10.4* 8.7* 8.2* 9.0* 8.9* 9.5*  HCT 32.0* 26.3* 24.5* 27.7* 27.1* 28.8*  MCV 110.0* 109.1*  --   --  105.4*  --   PLT 149* 125*  --   --  125*  --     Basic Metabolic Panel: Recent Labs  Lab 03/31/21 1752 04/01/21 0423 04/02/21 0230  NA 135 136 134*  K 4.9 4.3 4.1  CL 105 111 109  CO2 23 23 22   GLUCOSE 110* 89 108*  BUN 39* 30* 25*  CREATININE 1.30* 0.83 0.73  CALCIUM 8.5* 7.6* 7.4*  MG 2.2 1.7 2.0    GFR: Estimated Creatinine Clearance: 87 mL/min (by C-G formula  based on SCr of 0.73 mg/dL).  Liver Function Tests: Recent Labs  Lab 03/31/21 1752 04/01/21 0423  AST 40 37  ALT 15 14  ALKPHOS 68 58  BILITOT 1.2 1.0  PROT 5.7* 4.9*  ALBUMIN 2.6* 2.2*    CBG: No results for input(s): GLUCAP in the last 168 hours.   Recent Results (from the past 240 hour(s))  Blood culture (routine x 2)     Status: None (Preliminary result)   Collection Time: 03/31/21  6:00 PM   Specimen: BLOOD  Result Value Ref Range Status   Specimen Description   Final    BLOOD LEFT ANTECUBITAL Performed at [facility] [address] [place], [place] [address]    Special Requests   Final    BOTTLES DRAWN AEROBIC AND ANAEROBIC Blood Culture adequate volume Performed at [facility] [address], [place], [place] [address]    Culture   Final    NO GROWTH 2 DAYS Performed at [facility] [address] [place], [place] [address]    Report Status PENDING  Incomplete  Blood culture (routine x 2)     Status: None (Preliminary result)   Collection Time: 03/31/21  6:00 PM   Specimen: Left Antecubital; Blood  Result Value Ref Range Status   Specimen Description   Final    LEFT ANTECUBITAL Performed at [facility] [address] [place], [place] [address]    Special Requests   Final    BOTTLES DRAWN AEROBIC AND ANAEROBIC Blood Culture adequate volume Performed at [facility]  [address], [place], [place] [address]    Culture   Final    NO GROWTH 2 DAYS Performed at [facility] [address] [place], [place] [address]    Report Status PENDING  Incomplete  Resp Panel by RT-PCR (Flu A&B, Covid) Nasopharyngeal Swab     Status: None   Collection Time: 03/31/21  7:00 PM   Specimen: Nasopharyngeal Swab; Nasopharyngeal(NP) swabs in vial transport medium  Result Value Ref Range Status   SARS Coronavirus 2 by RT PCR NEGATIVE NEGATIVE Final    Comment: (NOTE) SARS-CoV-2 target nucleic acids are NOT DETECTED.  The SARS-CoV-2 RNA is generally detectable in upper respiratory specimens during the acute phase of infection. The lowest concentration of SARS-CoV-2 viral copies this assay can detect is 138 copies/mL. A negative result does not preclude SARS-Cov-2 infection and should not be used as the sole basis for treatment or other patient management decisions. A negative result may occur with  improper specimen collection/handling, submission of specimen other than nasopharyngeal swab, presence of viral mutation(s) within the areas targeted by this assay, and inadequate number of viral copies(<138 copies/mL). A negative result must be combined with clinical observations, patient history, and epidemiological information. The expected result is Negative.  Fact Sheet for Patients:  EntrepreneurPulse.com.au  Fact Sheet for Healthcare Providers:  IncredibleEmployment.be  This test is no t yet approved or cleared by the [place] FDA and  has been authorized for detection and/or diagnosis of SARS-CoV-2 by FDA under an Emergency Use Authorization (EUA). This EUA will remain  in effect (meaning this test can be used) for the duration of the COVID-19 declaration under Section 564(b)(1) of the Act, 21 U.S.C.section 360bbb-3(b)(1), unless the authorization is terminated  or revoked sooner.       Influenza A by PCR NEGATIVE  NEGATIVE Final   Influenza B by PCR NEGATIVE  NEGATIVE Final    Comment: (NOTE) The Xpert Xpress SARS-CoV-2/FLU/RSV plus assay is intended as an aid in the diagnosis of influenza from Nasopharyngeal swab specimens and should not be used as a sole basis for treatment. Nasal washings and aspirates are unacceptable for Xpert Xpress SARS-CoV-2/FLU/RSV testing.  Fact Sheet for Patients: EntrepreneurPulse.com.au  Fact Sheet for Healthcare Providers: IncredibleEmployment.be  This test is not yet approved or cleared by the [place] FDA and has been authorized for detection and/or diagnosis of SARS-CoV-2 by FDA under an Emergency Use Authorization (EUA). This EUA will remain in effect (meaning this test can be used) for the duration of the COVID-19 declaration under Section 564(b)(1) of the Act, 21 U.S.C. section 360bbb-3(b)(1), unless the authorization is terminated or revoked.  Performed at [facility], [place] [address], [place],  [place] [address]   Urine Culture     Status: None   Collection Time: 03/31/21  7:23 PM   Specimen: Urine, Random  Result Value Ref Range Status   Specimen Description   Final    URINE, RANDOM Performed at [facility] [address] [place], [place] [address]    Special Requests   Final    NONE Performed at [facility], [place] [address] [place], [place] [address]    Culture   Final    NO GROWTH Performed at [facility], [place] [address], [place], [place] [address]    Report Status 04/02/2021 FINAL  Final         Radiology Studies: CT ABDOMEN PELVIS WO CONTRAST  Result Date: 04/01/2021 CLINICAL DATA:  Evaluate for retroperitoneal hematoma. History of hepatitis C and cirrhosis. Gastroesophageal reflux disease. EXAM: CT ABDOMEN AND PELVIS WITHOUT CONTRAST TECHNIQUE: Multidetector CT imaging of the abdomen and pelvis was performed following the  standard protocol without IV contrast. COMPARISON:  10/02/2018 abdominal ultrasound. No prior CT. FINDINGS: Lower chest: Clear lung bases. Normal heart size with trace left larger than right pleural effusions. Hypoattenuation in the intravascular space suggests anemia. Incompletely imaged moderate to marked right-sided gynecomastia. Hepatobiliary: Mild cirrhosis, as evidenced by enlargement of the lateral segment left and caudate lobes. Scattered hepatic cysts and too small to characterize lesions. Abnormal appearance of the hepatic dome with an area of hypoattenuation on the order of 3.7 x 2.3 cm appearing to contact both the middle hepatic vein and a branch of the left portal vein. The left portal vein is prominent including on 17/2. Normal gallbladder, without biliary ductal dilatation. Pancreas: Normal pancreas for age, without duct dilatation or acute inflammation. Spleen: Normal in size, without focal abnormality. Adrenals/Urinary Tract: Normal adrenal glands. No renal calculi or hydronephrosis. Interpolar right renal 2.9 cm fluid density lesion is likely a cyst. Degraded evaluation of the pelvis, secondary to beam hardening artifact from right hip arthroplasty. Foci of hyperattenuation within the dependent bladder including on 68/2 of up to 11 mm. Stomach/Bowel: Normal stomach, without wall thickening. Scattered colonic diverticula. Normal terminal ileum. Appendix not visualized and surgically absent by history. Normal small bowel. Vascular/Lymphatic: Aortic atherosclerosis. Please see discussion of the left portal vein above. No abdominopelvic adenopathy. Reproductive: Grossly normal prostate. Other: No free intraperitoneal air. Trace pelvic fluid. Diminutive fat containing ventral abdominal wall hernia. Musculoskeletal: Right hip arthroplasty. Left hip osteoarthritis. Lumbosacral spondylosis. IMPRESSION: 1. No evidence of retroperitoneal hemorrhage. 2. Degraded exam, secondary to lack of oral or IV  contrast. 3. Mild cirrhosis. 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 Consider further evaluation with liver protocol pre and postcontrast CT. Alternatively, pre and post contrast MRI could be performed, but should be withheld until the patient can breath hold and follow directions. 4. Tiny bilateral pleural effusions. 5. Right-sided gynecomastia. 6. Dependent hyperattenuating foci in the bladder are likely stones. 7. Degraded evaluation of the pelvis, secondary to beam hardening artifact from right hip arthroplasty. Electronically Signed   By: Abigail Miyamoto M.D.   On: 04/01/2021 18:20   DG Chest Portable 1 View  Result Date: 03/31/2021 CLINICAL DATA:  Weakness. EXAM: PORTABLE CHEST 1 VIEW COMPARISON:  07/13/2018 FINDINGS: Right upper extremity PICC tip in the upper SVC. Chronic hyperinflation. No focal airspace disease. Stable heart size and mediastinal contours. No pleural fluid or pneumothorax. No pulmonary edema. No acute osseous abnormalities are seen. IMPRESSION: 1. Chronic hyperinflation without acute abnormality. 2. Right upper extremity PICC tip in the upper SVC. Electronically Signed   By: Keith Rake M.D.   On: 03/31/2021 18:06        Scheduled Meds:  Chlorhexidine Gluconate Cloth  6 each Topical Daily   lactulose  10 g Oral BID   [START ON 04/03/2021] levothyroxine  50 mcg Oral Q0600   senna-docusate  1 tablet Oral QHS   Continuous Infusions:     LOS: 1 day   Time spent: 23mins Greater than 50% of this time was spent in counseling, explanation of diagnosis, planning of further management, and coordination of care.   Voice Recognition Viviann Spare dictation system was used to create this note, attempts have been made to correct errors. Please contact the author with questions and/or clarifications.   Florencia Reasons, MD PhD FACP [facility]  Available via Epic secure chat 7am-7pm for  nonurgent issues Please page for urgent issues To page the attending provider between 7A-7P or the covering provider during after hours 7P-7A, please log into the web site [email or web address] and access using universal [place] password for that web site. If you do not have the password, please call the hospital operator.    04/02/2021, 5:25 PM

## 2021-04-02 NOTE — Evaluation (Addendum)
Physical Therapy Evaluation Patient Details Name: Justin Hampton MRN: [id number] DOB: 1946-09-22 Today's Date: 04/02/2021   History of Present Illness  Pt admitted with near syncopal episode 2* hypotension while attempting to enter home after release from [facility] 03/31/21.  Pt s/p R THR by posterior approach at [facility] 03/29/21.  Pt with hx of Hemophilia, Cirhossis, hepatic encephalopathy, vascular dementia, and cervical spinal stenosis  Clinical Impression  Pt admitted as above and presenting with functional mobility limitations 2* decreased R LE strength/ROM, post op pain, posterior THP, balance deficits and questionable safety awareness this date, pt up to ambulate in hall but with multiple episodes of balance loss requiring assist to avoid falls - when pt questioned about near fall he states "I'll be ok at home, I have my walker".  Orthostatic BP performed and numbers provided to RN.  Pt hopes to progress to dc home with follow up HHPT as originally planned with recent release from [facility].    Follow Up Recommendations Home health PT;SNF (Dependent on acute stay progress and ability of family to provide assist)    Equipment Recommendations  None recommended by PT    Recommendations for Other Services OT consult     Precautions / Restrictions Precautions Precautions: Posterior Hip Precaution Comments: Pt s/p posterior THR on 03/29/21 at [facility] but states "oh they are not using those rules anymore" Restrictions Weight Bearing Restrictions: No Other Position/Activity Restrictions: WBAT per pt following THR 03/29/21      Mobility  Bed Mobility Overal bed mobility: Needs Assistance Bed Mobility: Supine to Sit     Supine to sit: Min guard     General bed mobility comments: increased time and use of bed rails    Transfers Overall transfer level: Needs assistance Equipment used: Rolling walker (2 wheeled) Transfers: Sit to/from Stand Sit to Stand: Min assist;From elevated  surface         General transfer comment: cues for LE management and use of UEs to self assist.  Physical assist to bring wt up and fwd and to balance in standing  Ambulation/Gait Ambulation/Gait assistance: Min assist;Mod assist Gait Distance (Feet): 75 Feet Assistive device: Rolling walker (2 wheeled) Gait Pattern/deviations: Step-to pattern;Decreased step length - right;Decreased step length - left;Shuffle;Trunk flexed Gait velocity: decr   General Gait Details: cues for sequence, posture and position from RW; noted instability and LE buckling intermittently and LOB requiring assist to assist to avoid fall multiple times most notably on turning  Stairs            Wheelchair Mobility    Modified Rankin (Stroke Patients Only)       Balance Overall balance assessment: Needs assistance Sitting-balance support: No upper extremity supported;Feet supported Sitting balance-Leahy Scale: Good     Standing balance support: Bilateral upper extremity supported Standing balance-Leahy Scale: Poor                               Pertinent Vitals/Pain Pain Assessment: 0-10 Pain Score: 3  Pain Location: Rip Pain Descriptors / Indicators: Aching;Sore Pain Intervention(s): Limited activity within patient's tolerance;Monitored during session    [place] expects to be discharged to:: Private residence Living Arrangements: Alone Available Help at Discharge: Family;Available 24 hours/day Type of Home: House Home Access: Stairs to enter Entrance Stairs-Rails: Right Entrance Stairs-Number of Steps: 4 Home Layout: Able to live on main level with bedroom/bathroom;Two level Home Equipment: Walker - 2 wheels;Bedside commode;Hospital bed Additional  Comments: Pt states his sister will be assisting for a few weeks.  Pt has hospital bed on main level to eliminate need to get to second floor    Prior Function Level of Independence: Independent                Hand Dominance   Dominant Hand: Right    Extremity/Trunk Assessment   Upper Extremity Assessment Upper Extremity Assessment: Generalized weakness    Lower Extremity Assessment Lower Extremity Assessment: Generalized weakness    Cervical / Trunk Assessment Cervical / Trunk Assessment: Kyphotic  Communication   Communication: No difficulties  Cognition Arousal/Alertness: Awake/alert Behavior During Therapy: WFL for tasks assessed/performed Overall Cognitive Status: Within Functional Limits for tasks assessed                                 General Comments: Pt with limited insight into limitations      General Comments      Exercises     Assessment/Plan    PT Assessment Patient needs continued PT services  PT Problem List Decreased strength;Decreased range of motion;Decreased activity tolerance;Decreased mobility;Pain;Obesity;Decreased knowledge of use of DME;Decreased knowledge of precautions       PT Treatment Interventions DME instruction;Gait training;Stair training;Functional mobility training;Therapeutic activities;Therapeutic exercise;Patient/family education;Balance training    PT Goals (Current goals can be found in the Care Plan section)  Acute Rehab PT Goals Patient Stated Goal: HOME PT Goal Formulation: With patient Time For Goal Achievement: 04/16/21 Potential to Achieve Goals: Good    Frequency Min 5X/week   Barriers to discharge        Co-evaluation               AM-PAC PT "6 Clicks" Mobility  Outcome Measure Help needed turning from your back to your side while in a flat bed without using bedrails?: A Little Help needed moving from lying on your back to sitting on the side of a flat bed without using bedrails?: A Little Help needed moving to and from a bed to a chair (including a wheelchair)?: A Little Help needed standing up from a chair using your arms (e.g., wheelchair or bedside chair)?: A Little Help needed to  walk in hospital room?: A Little Help needed climbing 3-5 steps with a railing? : A Lot 6 Click Score: 17    End of Session Equipment Utilized During Treatment: Gait belt Activity Tolerance: Patient tolerated treatment well;Patient limited by fatigue Patient left: Other (comment) (bathroom) Nurse Communication: Mobility status PT Visit Diagnosis: Difficulty in walking, not elsewhere classified (R26.2);Unsteadiness on feet (R26.81);Pain Pain - Right/Left: Right Pain - part of body: Hip    Time: 6578-4696 PT Time Calculation (min) (ACUTE ONLY): 26 min   Charges:   PT Evaluation $PT Eval Low Complexity: 1 Low PT Treatments $Gait Training: 8-22 mins        Debe Coder PT Acute Rehabilitation Services Pager [phone] Office [phone]   Saraih Lorton 04/02/2021, 4:44 PM

## 2021-04-02 NOTE — Plan of Care (Signed)
  Problem: Education: Goal: Knowledge of General Education information will improve Description: Including pain rating scale, medication(s)/side effects and non-pharmacologic comfort measures 04/02/2021 2136 by Normand Sloop, RN Outcome: Adequate for Discharge 04/02/2021 2135 by Normand Sloop, RN Outcome: Adequate for Discharge   Problem: Health Behavior/Discharge Planning: Goal: Ability to manage health-related needs will improve 04/02/2021 2136 by Normand Sloop, RN Outcome: Adequate for Discharge 04/02/2021 2135 by Normand Sloop, RN Outcome: Adequate for Discharge   Problem: Clinical Measurements: Goal: Ability to maintain clinical measurements within normal limits will improve 04/02/2021 2136 by Normand Sloop, RN Outcome: Adequate for Discharge 04/02/2021 2135 by Normand Sloop, RN Outcome: Adequate for Discharge Goal: Will remain free from infection 04/02/2021 2136 by Normand Sloop, RN Outcome: Adequate for Discharge 04/02/2021 2135 by Normand Sloop, RN Outcome: Adequate for Discharge Goal: Diagnostic test results will improve 04/02/2021 2136 by Normand Sloop, RN Outcome: Adequate for Discharge 04/02/2021 2135 by Normand Sloop, RN Outcome: Adequate for Discharge Goal: Respiratory complications will improve 04/02/2021 2136 by Normand Sloop, RN Outcome: Adequate for Discharge 04/02/2021 2135 by Normand Sloop, RN Outcome: Adequate for Discharge Goal: Cardiovascular complication will be avoided 04/02/2021 2136 by Normand Sloop, RN Outcome: Adequate for Discharge 04/02/2021 2135 by Normand Sloop, RN Outcome: Adequate for Discharge   Problem: Activity: Goal: Risk for activity intolerance will decrease 04/02/2021 2136 by Normand Sloop, RN Outcome: Adequate for Discharge 04/02/2021 2135 by Normand Sloop, RN Outcome: Adequate for Discharge   Problem: Nutrition: Goal: Adequate nutrition will be maintained 04/02/2021 2136 by Normand Sloop, RN Outcome:  Adequate for Discharge 04/02/2021 2135 by Normand Sloop, RN Outcome: Adequate for Discharge   Problem: Coping: Goal: Level of anxiety will decrease 04/02/2021 2136 by Normand Sloop, RN Outcome: Adequate for Discharge 04/02/2021 2135 by Normand Sloop, RN Outcome: Adequate for Discharge   Problem: Elimination: Goal: Will not experience complications related to bowel motility 04/02/2021 2136 by Normand Sloop, RN Outcome: Adequate for Discharge 04/02/2021 2135 by Normand Sloop, RN Outcome: Adequate for Discharge Goal: Will not experience complications related to urinary retention 04/02/2021 2136 by Normand Sloop, RN Outcome: Adequate for Discharge 04/02/2021 2135 by Normand Sloop, RN Outcome: Adequate for Discharge   Problem: Pain Managment: Goal: General experience of comfort will improve 04/02/2021 2136 by Normand Sloop, RN Outcome: Adequate for Discharge 04/02/2021 2135 by Normand Sloop, RN Outcome: Adequate for Discharge   Problem: Safety: Goal: Ability to remain free from injury will improve 04/02/2021 2136 by Normand Sloop, RN Outcome: Adequate for Discharge 04/02/2021 2135 by Normand Sloop, RN Outcome: Adequate for Discharge   Problem: Skin Integrity: Goal: Risk for impaired skin integrity will decrease 04/02/2021 2136 by Normand Sloop, RN Outcome: Adequate for Discharge 04/02/2021 2135 by Normand Sloop, RN Outcome: Adequate for Discharge

## 2021-04-05 LAB — CULTURE, BLOOD (ROUTINE X 2)
Culture: NO GROWTH
Culture: NO GROWTH
Special Requests: ADEQUATE
Special Requests: ADEQUATE

## 2021-04-12 ENCOUNTER — Encounter (HOSPITAL_COMMUNITY): Payer: Self-pay

## 2021-04-12 ENCOUNTER — Other Ambulatory Visit: Payer: Self-pay

## 2021-04-12 ENCOUNTER — Emergency Department (HOSPITAL_COMMUNITY)
Admission: EM | Admit: 2021-04-12 | Discharge: 2021-04-13 | Disposition: A | Discharge status: Home / Self Care | Payer: Medicare Other | Attending: Emergency Medicine | Admitting: Emergency Medicine

## 2021-04-12 DIAGNOSIS — Z87891 Personal history of nicotine dependence: Secondary | ICD-10-CM | POA: Insufficient documentation

## 2021-04-12 DIAGNOSIS — Z79899 Other long term (current) drug therapy: Secondary | ICD-10-CM | POA: Diagnosis not present

## 2021-04-12 DIAGNOSIS — N183 Chronic kidney disease, stage 3 unspecified: Secondary | ICD-10-CM | POA: Insufficient documentation

## 2021-04-12 DIAGNOSIS — Z85038 Personal history of other malignant neoplasm of large intestine: Secondary | ICD-10-CM | POA: Diagnosis not present

## 2021-04-12 DIAGNOSIS — E039 Hypothyroidism, unspecified: Secondary | ICD-10-CM | POA: Diagnosis not present

## 2021-04-12 DIAGNOSIS — R531 Weakness: Secondary | ICD-10-CM | POA: Insufficient documentation

## 2021-04-12 DIAGNOSIS — F0390 Unspecified dementia without behavioral disturbance: Secondary | ICD-10-CM | POA: Diagnosis not present

## 2021-04-12 DIAGNOSIS — I951 Orthostatic hypotension: Secondary | ICD-10-CM

## 2021-04-12 LAB — CBC WITH DIFFERENTIAL/PLATELET
Abs Immature Granulocytes: 0.07 10*3/uL (ref 0.00–0.07)
Basophils Absolute: 0.1 10*3/uL (ref 0.0–0.1)
Basophils Relative: 1 %
Eosinophils Absolute: 0.1 10*3/uL (ref 0.0–0.5)
Eosinophils Relative: 1 %
HCT: 31.5 % — ABNORMAL LOW (ref 39.0–52.0)
Hemoglobin: 10.1 g/dL — ABNORMAL LOW (ref 13.0–17.0)
Immature Granulocytes: 1 %
Lymphocytes Relative: 5 %
Lymphs Abs: 0.6 10*3/uL — ABNORMAL LOW (ref 0.7–4.0)
MCH: 35.3 pg — ABNORMAL HIGH (ref 26.0–34.0)
MCHC: 32.1 g/dL (ref 30.0–36.0)
MCV: 110.1 fL — ABNORMAL HIGH (ref 80.0–100.0)
Monocytes Absolute: 0.5 10*3/uL (ref 0.1–1.0)
Monocytes Relative: 5 %
Neutro Abs: 9.1 10*3/uL — ABNORMAL HIGH (ref 1.7–7.7)
Neutrophils Relative %: 87 %
Platelets: 201 10*3/uL (ref 150–400)
RBC: 2.86 MIL/uL — ABNORMAL LOW (ref 4.22–5.81)
RDW: 14.1 % (ref 11.5–15.5)
WBC: 10.3 10*3/uL (ref 4.0–10.5)
nRBC: 0 % (ref 0.0–0.2)

## 2021-04-12 LAB — COMPREHENSIVE METABOLIC PANEL
ALT: 17 U/L (ref 0–44)
AST: 24 U/L (ref 15–41)
Albumin: 2.5 g/dL — ABNORMAL LOW (ref 3.5–5.0)
Alkaline Phosphatase: 85 U/L (ref 38–126)
Anion gap: 7 (ref 5–15)
BUN: 29 mg/dL — ABNORMAL HIGH (ref 8–23)
CO2: 23 mmol/L (ref 22–32)
Calcium: 8.5 mg/dL — ABNORMAL LOW (ref 8.9–10.3)
Chloride: 106 mmol/L (ref 98–111)
Creatinine, Ser: 1.23 mg/dL (ref 0.61–1.24)
GFR, Estimated: >60 mL/min (ref >60)
Glucose, Bld: 130 mg/dL — ABNORMAL HIGH (ref 70–99)
Potassium: 4.9 mmol/L (ref 3.5–5.1)
Sodium: 136 mmol/L (ref 135–145)
Total Bilirubin: 0.9 mg/dL (ref 0.3–1.2)
Total Protein: 5.7 g/dL — ABNORMAL LOW (ref 6.5–8.1)

## 2021-04-12 LAB — LACTIC ACID, PLASMA: Lactic Acid, Venous: 1.9 mmol/L (ref 0.5–1.9)

## 2021-04-12 MED ORDER — LACTATED RINGERS IV SOLN: INTRAVENOUS | Status: DC

## 2021-04-12 MED ORDER — LACTATED RINGERS IV BOLUS
1000.0000 mL | Freq: Once | INTRAVENOUS | Status: AC
  Administered 2021-04-12: 1000 mL via INTRAVENOUS

## 2021-04-12 NOTE — ED Provider Notes (Addendum)
[facility] DEPT Provider Note   CSN: [id number] Arrival date & time: 04/12/21  1826     History Chief Complaint  Patient presents with   Weakness    Justin Hampton is a 75 y.o. male.  75 year old male presents with increasing weakness.  Patient states that he walked to the bathroom and sat down on the commode moved his bowels and when he stood up he felt very lightheaded.  Had recent admission to [facility] for hypotension secondary to AKI.  States he has had no recent illnesses currently.  Denies any fever, vomiting, diarrhea.  Did have recent hip surgery.  Denies any issues with that.  Denies any falls.  Did not have any chest pain or shortness of breath prior to the event.  EMS was called and patient's blood pressure was 84/50.  Given fluids and feels better at this time.  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   Hepatitis C    treated   Hypoglycemia    Hypothyroidism     Patient Active Problem List   Diagnosis Date Noted   Hypotension 04/01/2021   Lactic acidosis 04/01/2021   Loose stools 04/01/2021   AKI (acute kidney injury) ([place]) 04/01/2021   Syncope 04/01/2021   Near syncope 03/31/2021   Hypoglycemia 10/31/2018   Impaired functional mobility, balance, gait, and endurance 10/31/2018   Dilation of pancreatic duct 10/02/2018   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   History of encephalopathy 09/28/2018   Vascular dementia without behavioral disturbance ([place])    History of hepatitis C    Hypothyroidism (acquired)    Gastroesophageal reflux disease    Aneurysm,  cerebral, nonruptured    AMS (altered mental status) 08/17/2018   Gait disturbance 08/02/2018   Mild memory disturbance 08/02/2018   Hepatic encephalopathy ([place]) 07/13/2018   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   CKD (chronic kidney disease), stage III ([place]) 07/13/2018   Cervical stenosis of spine 05/29/2018   Degenerative disc disease, cervical 05/29/2018   Iron deficiency anemia due to chronic blood loss 09/12/2017   Rectal bleeding 06/06/2017   Acute renal failure (ARF) ([place]) 06/06/2017   Hyponatremia 06/06/2017   Hyperkalemia 06/06/2017   Anemia 06/06/2017   GI bleeding 06/06/2017   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[place])    Other dental procedure status 05/22/2017   Family history of CHF (congestive heart failure) Feb 04, 2017   Family history of sudden death 04-Feb-2017   Dyspnea 07-23-2016   Other disorder of circulatory system 04/12/2012   Phlebolithiasis 04/12/2012   Abnormal prostate by palpation 04/11/2012   Benign neoplasm of colon 04/11/2012   Degenerative joint disease of pelvic region 04/11/2012   Disorder of prostate 04/11/2012   Iron excess 04/11/2012   Lumbosacral spondylosis without myelopathy 04/11/2012   Nocturia 04/11/2012   Orthostatic hypotension 04/11/2012   Osteoarthritis of both hips 04/11/2012   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   Left sided sciatica 04/11/2012   History of colonic polyps 10/03/2011    Past Surgical History:  Procedure Laterality Date  APPENDECTOMY     CARPAL TUNNEL RELEASE Bilateral    COLONOSCOPY     ESOPHAGOGASTRODUODENOSCOPY     x 2   ESOPHAGOGASTRODUODENOSCOPY (EGD) WITH PROPOFOL N/A 10/29/2018   Procedure: ESOPHAGOGASTRODUODENOSCOPY (EGD) WITH PROPOFOL;  Surgeon: Rush Landmark [name], MD;  Location: [facility];  Service: Gastroenterology;  Laterality: N/A;   EUS N/A 10/29/2018   Procedure: UPPER ENDOSCOPIC ULTRASOUND (EUS) RADIAL;  Surgeon: Irving Copas., MD;  Location: [place] [facility];  Service:  Gastroenterology;  Laterality: N/A;       Family History  Problem Relation Age of Onset   Dementia Mother 52   Heart failure Mother    Osteoarthritis Mother    Hypertension Mother    Hypertension Father    Heart attack Father    Breast cancer Sister    Uterine cancer Sister    Hypertension Sister    Hemophilia Brother    Heart disease Brother    Hypertension Brother    Hypothyroidism Sister    Osteoarthritis Sister    Heart failure Maternal Grandmother    Stroke Maternal Grandmother    Hypertension Maternal Grandmother    Heart failure Maternal Grandfather    Diabetes Paternal Grandfather    Colon cancer Neg Hx    Esophageal cancer Neg Hx    Inflammatory bowel disease Neg Hx    Liver disease Neg Hx    Pancreatic cancer Neg Hx    Rectal cancer Neg Hx    Stomach cancer Neg Hx     Social History   Tobacco Use   Smoking status: Former    Years: 20.00    Pack years: 0.00    Types: Cigarettes    Quit date: 08/1995    Years since quitting: 25.6   Smokeless tobacco: Never  Vaping Use   Vaping Use: Never used  Substance Use Topics   Alcohol use: Not Currently   Drug use: No    Home Medications Prior to Admission medications   Medication Sig Start Date End Date Taking? Authorizing Provider  acetaminophen (TYLENOL) 500 MG tablet Take 500 mg by mouth every 6 (six) hours as needed for mild pain.    [provider]  albuterol (PROVENTIL HFA;VENTOLIN HFA) 108 (90 Base) MCG/ACT inhaler Inhale 1-2 puffs into the lungs every 6 (six) hours as needed for wheezing or shortness of breath.    [provider]  coagulation factor IX, recomb, (BENEFIX) 1000 units injection Inject 4,000 Units into the vein See admin instructions. Whenever having surgery  As directed by hematologist 07/28/16   [provider]  Emollient (AQUAPHOR EX) Apply 1 application topically 2 (two) times daily as needed (for dry skin).  12/08/17   [provider]   HYDROcodone-acetaminophen (NORCO/VICODIN) 5-325 MG tablet Take 1 tablet by mouth every 6 (six) hours as needed for moderate pain or severe pain. 03/31/21   [provider]  lactulose, encephalopathy, (CHRONULAC) 10 GM/15ML SOLN Take 30 g by mouth in the morning, at noon, and at bedtime. 11/30/20   [provider]  levothyroxine (SYNTHROID, LEVOTHROID) 50 MCG tablet Take 50 mcg by mouth daily.    [provider]  magnesium oxide (MAG-OX) 400 (241.3 Mg) MG tablet Take 400 mg by mouth daily.    [provider]  pantoprazole (PROTONIX) 40 MG tablet Take 40 mg by mouth daily. 01/03/18   [provider]  spironolactone (ALDACTONE) 25 MG tablet Take 25 mg by mouth daily.    [provider]  traMADol Veatrice Bourbon) 50  MG tablet Take 50 mg by mouth every 8 (eight) hours as needed for moderate pain or severe pain. 03/25/21   [provider]    Allergies    Morphine and Zolpidem  Review of Systems   Review of Systems  All other systems reviewed and are negative.  Physical Exam Updated Vital Signs BP (!) 97/57 (BP Location: Left Arm)   Pulse 87   Temp 98.4 F (36.9 C) (Oral)   Resp 14   SpO2 96%   Physical Exam Vitals and nursing note reviewed.  Constitutional:      General: He is not in acute distress.    Appearance: Normal appearance. He is well-developed. He is not toxic-appearing.  HENT:     Head: Normocephalic and atraumatic.  Eyes:     General: Lids are normal.     Conjunctiva/sclera: Conjunctivae normal.     Pupils: Pupils are equal, round, and reactive to light.  Neck:     Thyroid: No thyroid mass.     Trachea: No tracheal deviation.  Cardiovascular:     Rate and Rhythm: Normal rate and regular rhythm.     Heart sounds: Normal heart sounds. No murmur heard.   No gallop.  Pulmonary:     Effort: Pulmonary effort is normal. No respiratory distress.     Breath sounds: Normal breath sounds. No stridor. No decreased breath sounds,  wheezing, rhonchi or rales.  Abdominal:     General: There is no distension.     Palpations: Abdomen is soft.     Tenderness: There is no abdominal tenderness. There is no rebound.  Musculoskeletal:        General: No tenderness. Normal range of motion.     Cervical back: Normal range of motion and neck supple.  Skin:    General: Skin is warm and dry.     Findings: No abrasion or rash.  Neurological:     General: No focal deficit present.     Mental Status: He is alert and oriented to person, place, and time. Mental status is at baseline.     GCS: GCS eye subscore is 4. GCS verbal subscore is 5. GCS motor subscore is 6.     Cranial Nerves: Cranial nerves are intact. No cranial nerve deficit.     Sensory: No sensory deficit.     Motor: Motor function is intact.  Psychiatric:        Attention and Perception: Attention normal.        Speech: Speech normal.        Behavior: Behavior normal.    ED Results / Procedures / Treatments   Labs (all labs ordered are listed, but only abnormal results are displayed) Labs Reviewed  CBC WITH DIFFERENTIAL/PLATELET  LACTIC ACID, PLASMA  COMPREHENSIVE METABOLIC PANEL    EKG EKG Interpretation  Date/Time:  Monday April 12 2021 18:45:55 EDT Ventricular Rate:  86 PR Interval:  163 QRS Duration: 101 QT Interval:  369 QTC Calculation: 442 R Axis:   -17 Text Interpretation: Sinus rhythm Borderline left axis deviation Low voltage, precordial leads Confirmed by Lacretia Leigh ([id number]) on 04/12/2021 7:18:58 PM  Radiology No results found.  Procedures Procedures   Medications Ordered in ED Medications  lactated ringers bolus 1,000 mL (has no administration in time range)  lactated ringers infusion (has no administration in time range)    ED Course  I have reviewed the triage vital signs and the nursing notes.  Pertinent labs & imaging results that were  available during my care of the patient were reviewed by me and considered in my  medical decision making (see chart for details).    MDM Rules/Calculators/A&P                          Final Clinical Impression(s) / ED Diagnoses Final diagnoses:  None   Patient given IV fluids here.  He denies any dizziness.  Suspect he had a vagal episode as this episode happened when he was getting off the toilet.  We will follow-up with his doctor as needed Rx / DC Orders ED Discharge Orders     None        Lacretia Leigh, MD 04/12/21 2300    Lacretia Leigh, MD 04/12/21 2302

## 2021-04-12 NOTE — ED Triage Notes (Signed)
Pt had hip surgery 2 weeks ago. Pt too weak to stand on his own today.  Per family pt was able to walk after the surgery but has declined over time. Pt has not been able to walk today.  BP was low for EMS with lowest BP 84/50 and last BP 100/58, RR 16, HR 90, O2 98% and temp 97.1  Per EMS [facility].

## 2021-04-12 NOTE — ED Notes (Signed)
[facility] called  

## 2021-04-12 NOTE — ED Notes (Signed)
Per pts daughter, Corey Harold needed for pt due to weakness, pt not able to ambulate.

## 2021-07-13 ENCOUNTER — Other Ambulatory Visit: Payer: Self-pay

## 2021-07-13 ENCOUNTER — Encounter: Payer: Self-pay | Admitting: Rehabilitative and Restorative Service Providers"

## 2021-07-13 ENCOUNTER — Ambulatory Visit: Payer: Medicare Other | Attending: Nurse Practitioner | Admitting: Rehabilitative and Restorative Service Providers"

## 2021-07-13 DIAGNOSIS — R293 Abnormal posture: Secondary | ICD-10-CM

## 2021-07-13 DIAGNOSIS — M6281 Muscle weakness (generalized): Secondary | ICD-10-CM

## 2021-07-13 DIAGNOSIS — R262 Difficulty in walking, not elsewhere classified: Secondary | ICD-10-CM | POA: Diagnosis present

## 2021-07-13 DIAGNOSIS — M436 Torticollis: Secondary | ICD-10-CM | POA: Diagnosis present

## 2021-07-13 DIAGNOSIS — M25551 Pain in right hip: Secondary | ICD-10-CM | POA: Diagnosis present

## 2021-07-13 DIAGNOSIS — M542 Cervicalgia: Secondary | ICD-10-CM

## 2021-07-13 NOTE — Therapy (Signed)
[facility] [place], [place], [address] Phone: [phone]   Fax:  [phone]  Physical Therapy Evaluation  Patient Details  Name: Justin Hampton MRN: [id number] Date of Birth: 1946-08-14 Referring Provider (PT): Eldridge Abrahams, NP   Encounter Date: 07/13/2021   PT End of Session - 07/13/21 1317     Visit Number 1    Date for PT Re-Evaluation 10/01/21    PT Start Time 1230    PT Stop Time 1310    PT Time Calculation (min) 40 min    Activity Tolerance Patient tolerated treatment well    Behavior During Therapy [facility] for tasks assessed/performed             Past Medical History:  Diagnosis Date   Adenomatous colon polyp    Arthritis    fingers   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   Cirrhosis ([place])    Diverticulosis    GERD (gastroesophageal reflux disease)    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   Hepatic encephalopathy ([place]) 04/12/2012   Hepatitis C    treated   Hypoglycemia    Hypothyroidism     Past Surgical History:  Procedure Laterality Date   APPENDECTOMY     CARPAL TUNNEL RELEASE Bilateral    COLONOSCOPY     ESOPHAGOGASTRODUODENOSCOPY     x 2   ESOPHAGOGASTRODUODENOSCOPY (EGD) WITH PROPOFOL N/A 10/29/2018   Procedure: ESOPHAGOGASTRODUODENOSCOPY (EGD) WITH PROPOFOL;  Surgeon: Irving Copas., MD;  Location: [place];  Service: Gastroenterology;  Laterality: N/A;   EUS N/A 10/29/2018   Procedure: UPPER ENDOSCOPIC ULTRASOUND (EUS) RADIAL;  Surgeon: Irving Copas., MD;  Location: [facility];  Service: Gastroenterology;  Laterality: N/A;    There were no vitals filed for this visit.    Subjective Assessment - 07/13/21 1236     Subjective Pt reports that in June, he had a hip arthroplasty and he spent increased time in bed with forward head posture.  He states that since that time, his head has been more forward flexed and he has a difficult time  looking ahead or up.    Pertinent History Cirrhosis, THA in June 2022 by Dr Jefferson Fuel    Limitations House hold activities;Walking    Patient Stated Goals Pt would like to be more upright with a straighter back and to be able to look up to use microwave easily again.    Currently in Pain? Yes    Pain Score 4     Pain Location Neck    Pain Orientation Posterior    Pain Descriptors / Indicators Sore    Pain Type Chronic pain    Pain Onset More than a month ago    Pain Frequency Constant                [facility] PT Assessment - 07/13/21 0001       Assessment   Medical Diagnosis M54.2 (ICD-10-CM) - Cervicalgia  R53.1 (ICD-10-CM) - Weakness    Referring Provider (PT) Eldridge Abrahams, NP    Hand Dominance Left    Next MD Visit as needed    Prior Therapy PT after hip surgery      Precautions   Precautions Fall      Restrictions   Weight Bearing Restrictions No      Balance Screen   Has the patient fallen in the past 6 months Yes    How many times? 1  Has the patient had a decrease in activity level because of a fear of falling?  Yes    Is the patient reluctant to leave their home because of a fear of falling?  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   Overall Cognitive Status Within Functional Limits for tasks assessed      Observation/Other Assessments   Focus on Therapeutic Outcomes (FOTO)  51%      Posture/Postural Control   Posture/Postural Control Postural limitations    Postural Limitations Forward head;Rounded Shoulders;Increased thoracic kyphosis      ROM / Strength   AROM / PROM / Strength AROM;Strength       AROM   Overall AROM Comments cervical ROM limited approx 75% throughout      Strength   Overall Strength Comments B shoulder strength of 4/5, R hip strength of 4/5      Transfers   Five time sit to stand comments  21.7 sec with UE use required      Standardized Balance Assessment   Standardized Balance Assessment Timed Up and Go Test      Timed Up and Go Test   TUG Normal TUG    Normal TUG (seconds) 25.4   with RW                       Objective measurements completed on examination: See above findings.                PT Education - 07/13/21 1310     Education Details Pt provided with HEP    Person(s) Educated Patient    Methods Handout;Explanation;Demonstration    Comprehension Verbalized understanding;Returned demonstration              PT Short Term Goals - 07/13/21 1327       PT SHORT TERM GOAL #1   Title be independent in initial HEP    Time 2    Period Weeks    Status New               PT Long Term Goals - 07/13/21 1328       PT LONG TERM GOAL #1   Title be independent in advanced HEP    Time 12    Period Weeks    Status New      PT LONG TERM GOAL #2   Title improve FOTO to at least 61%    Time 12    Period Weeks    Status New      PT LONG TERM GOAL #3   Title Pt will increase cervical AROM to [facility] to allow him to look up to the microwave and to be able to look behind him to drive.    Time 12    Period Weeks    Status New      PT LONG TERM GOAL #4   Title Pt will increase B shoulder and R hip strength to at least  4+/5 to allow him to perform functional activities in his home.    Time 12    Period Weeks    Status New      PT LONG TERM GOAL #5   Title Pt will decrease TUG to 14 sec or less with LRAD to decrease his risk of falling.    Time 12    Period Weeks    Status New                    Plan - 07/13/21 1319     Clinical Impression Statement Justin Hampton is a 75 y.o. male referred to  outpatient PT by Eldridge Abrahams, NP with a diagnosis of Cervicalgia and weakness.  He reports that he had a R THA in June 2022 by Dr Jefferson Fuel and at that time, he started noticing that he was looking down more than he was previous.  He states that he is now unable to easily put his food in the microwave and look up to see the buttons to heat up his food.  He states that he is still also having some RLE weakness and still using the RW following his hip surgery.  He reports that prior to his R THA, he was able to ambulate without assistive device and perform functions in his home without assistance, but his sisters have been coming more often to help him out now.  Pt presents with decreased cervical ROM, impaired posture, weakness, difficulty walking, decreased balance, and decreased ability to perform functional activities in his home.  He would benefit from skilled PT to address his functional impairments to allow him to be safer and more independent in his home.    Personal Factors and Comorbidities Age;Comorbidity 1    Comorbidities R THA June 2022    Examination-Activity Limitations Locomotion Level;Reach Overhead;Stairs;Lift    Examination-Participation Restrictions Community Activity;Cleaning;Meal Prep    Stability/Clinical Decision Making Evolving/Moderate complexity    Clinical Decision Making Moderate    Rehab Potential Good    PT Frequency 2x / week    PT Duration 12 weeks    PT Treatment/Interventions ADLs/Self Care Home Management;Cryotherapy;Electrical Stimulation;Iontophoresis 4mg /ml Dexamethasone;Moist Heat;Traction;Ultrasound;Gait training;Stair training;Functional mobility training;Therapeutic activities;Therapeutic exercise;Balance training;Neuromuscular re-education;Patient/family education;Manual techniques;Passive range of motion;Dry needling;Taping;Vasopneumatic Device;Spinal Manipulations;Joint Manipulations    PT Next Visit Plan assess and progress HEP, strengthening, ROM, postural  corrections    PT Home Exercise Plan Access Code [email or web address]    Consulted and Agree with Plan of Care Patient             Patient will benefit from skilled therapeutic intervention in order to improve the following deficits and impairments:  Decreased range of motion, Difficulty walking, Decreased endurance, Increased muscle spasms, Impaired UE functional use, Decreased activity tolerance, Pain, Decreased balance, Impaired flexibility, Improper body mechanics, Decreased strength, Postural dysfunction  Visit Diagnosis: Cervicalgia - Plan: PT plan of care cert/re-cert  Muscle weakness (generalized) - Plan: PT plan of care cert/re-cert  Neck stiffness - Plan: PT plan of care cert/re-cert  Pain in right hip - Plan: PT plan of care cert/re-cert  Difficulty in walking, not elsewhere classified - Plan: PT plan of care cert/re-cert  Abnormal posture - Plan: PT plan of care cert/re-cert     Problem List Patient Active Problem List   Diagnosis Date Noted   Hypotension 04/01/2021   Lactic acidosis 04/01/2021   Loose stools 04/01/2021   AKI (acute kidney injury) ([place]) 04/01/2021   Syncope 04/01/2021   Near syncope 03/31/2021  Hypoglycemia 10/31/2018   Impaired functional mobility, balance, gait, and endurance 10/31/2018   Dilation of pancreatic duct 10/02/2018   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   History of encephalopathy 09/28/2018   Vascular dementia without behavioral disturbance ([place])    History of hepatitis C    Hypothyroidism (acquired)    Gastroesophageal reflux disease    Aneurysm, cerebral, nonruptured    AMS (altered mental status) 08/17/2018   Gait disturbance 08/02/2018   Mild memory disturbance 08/02/2018   Hepatic encephalopathy ([place]) 07/13/2018   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[place]) 07/13/2018   CKD (chronic kidney disease), stage III ([place]) 07/13/2018   Cervical stenosis of spine  05/29/2018   Degenerative disc disease, cervical 05/29/2018   Iron deficiency anemia due to chronic blood loss 09/12/2017   Rectal bleeding 06/06/2017   Acute renal failure (ARF) ([place]) 06/06/2017   Hyponatremia 06/06/2017   Hyperkalemia 06/06/2017   Anemia 06/06/2017   GI bleeding 06/06/2017   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[place])    Other dental procedure status 05/22/2017   Family history of CHF (congestive heart failure) 30-Jan-2017   Family history of sudden death 01-30-2017   Dyspnea 07/18/2016   Other disorder of circulatory system 04/12/2012   Phlebolithiasis 04/12/2012   Abnormal prostate by palpation 04/11/2012   Benign neoplasm of colon 04/11/2012   Degenerative joint disease of pelvic region 04/11/2012   Disorder of prostate 04/11/2012   Iron excess 04/11/2012   Lumbosacral spondylosis without myelopathy 04/11/2012   Nocturia 04/11/2012   Orthostatic hypotension 04/11/2012   Osteoarthritis of both hips 04/11/2012   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   Left sided sciatica 04/11/2012   History of colonic polyps 10/03/2011    Justin Hampton, PT, DPT 07/13/2021, 1:35 PM  Justin Hampton. [place], [place], [address] Phone: [phone]   Fax:  [phone]  Name: Justin Hampton MRN: [id number] Date of Birth: 1946/03/14

## 2021-07-13 NOTE — Patient Instructions (Signed)
Access Code: [email or web address] URL: [email or web address] Date: 07/13/2021 Prepared by: Juel Burrow  Exercises Seated Scapular Retraction - 2 x daily - 7 x weekly - 2 sets - 10 reps Seated Cervical Flexion AROM - 2 x daily - 7 x weekly - 2 sets - 10 reps Seated Cervical Rotation AROM - 2 x daily - 7 x weekly - 2 sets - 10 reps Seated Overhead Reach Stretch - 2 x daily - 7 x weekly - 2 sets - 10 reps

## 2021-08-03 ENCOUNTER — Encounter: Payer: Self-pay | Admitting: Rehabilitative and Restorative Service Providers"

## 2021-08-03 ENCOUNTER — Ambulatory Visit: Payer: Medicare Other | Attending: Nurse Practitioner | Admitting: Rehabilitative and Restorative Service Providers"

## 2021-08-03 ENCOUNTER — Other Ambulatory Visit: Payer: Self-pay

## 2021-08-03 DIAGNOSIS — R262 Difficulty in walking, not elsewhere classified: Secondary | ICD-10-CM | POA: Diagnosis present

## 2021-08-03 DIAGNOSIS — M6281 Muscle weakness (generalized): Secondary | ICD-10-CM | POA: Insufficient documentation

## 2021-08-03 DIAGNOSIS — M542 Cervicalgia: Secondary | ICD-10-CM | POA: Diagnosis not present

## 2021-08-03 DIAGNOSIS — R293 Abnormal posture: Secondary | ICD-10-CM | POA: Diagnosis present

## 2021-08-03 DIAGNOSIS — M436 Torticollis: Secondary | ICD-10-CM | POA: Insufficient documentation

## 2021-08-03 NOTE — Therapy (Signed)
[facility] [place], [place], [address] Phone: [phone]   Fax:  [phone]  Physical Therapy Treatment  Patient Details  Name: Justin Hampton MRN: [id number] Date of Birth: [birth year] Referring Provider (PT): Justin Abrahams, NP   Encounter Date: 08/03/2021   PT End of Session - 08/03/21 1028     Visit Number 2    Date for PT Re-Evaluation 10/01/21    PT Start Time 1020    PT Stop Time 1100    PT Time Calculation (min) 40 min    Activity Tolerance Patient tolerated treatment well    Behavior During Therapy [facility] for tasks assessed/performed             Past Medical History:  Diagnosis Date   Adenomatous colon polyp    Arthritis    fingers   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   Cirrhosis ([place])    Diverticulosis    GERD (gastroesophageal reflux disease)    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   Hepatic encephalopathy 04/12/2012   Hepatitis C    treated   Hypoglycemia    Hypothyroidism     Past Surgical History:  Procedure Laterality Date   APPENDECTOMY     CARPAL TUNNEL RELEASE Bilateral    COLONOSCOPY     ESOPHAGOGASTRODUODENOSCOPY     x 2   ESOPHAGOGASTRODUODENOSCOPY (EGD) WITH PROPOFOL N/A 10/29/2018   Procedure: ESOPHAGOGASTRODUODENOSCOPY (EGD) WITH PROPOFOL;  Surgeon: Justin Hampton., MD;  Location: [facility];  Service: Gastroenterology;  Laterality: N/A;   EUS N/A 10/29/2018   Procedure: UPPER ENDOSCOPIC ULTRASOUND (EUS) RADIAL;  Surgeon: Justin Hampton., MD;  Location: [facility];  Service: Gastroenterology;  Laterality: N/A;    There were no vitals filed for this visit.   Subjective Assessment - 08/03/21 1027     Subjective Pt reports that he has been doing his HEP daily.  He states that he is not in as much pain today.    Currently in Pain? Yes    Pain Score 2     Pain Location Neck    Pain Orientation Posterior    Pain  Descriptors / Indicators Sore                               [facility] Adult PT Treatment/Exercise - 08/03/21 0001       Exercises   Exercises Neck      Neck Exercises: Machines for Strengthening   Nustep L5 x6 min      Neck Exercises: Theraband   Scapula Retraction 20 reps;Red    Scapula Retraction Limitations in sitting    Shoulder Extension 20 reps;Red    Shoulder Extension Limitations in sitting    Shoulder External Rotation 20 reps;Red    Horizontal ADduction 20 reps;Red      Neck Exercises: Seated   Neck Retraction 20 reps;3 secs    Neck Retraction Limitations into ball    Cervical Rotation Both;5 reps    W Back 10 reps    Shoulder Rolls Backwards;20 reps    Upper Extremity D2 Flexion;10 reps    Theraband Level (UE D2) Level 2 (Red)                       PT Short Term Goals - 08/03/21 1111       PT SHORT TERM  GOAL #1   Title be independent in initial HEP    Status Achieved               PT Long Term Goals - 08/03/21 1111       PT LONG TERM GOAL #1   Title be independent in advanced HEP    Status On-going                   Plan - 08/03/21 1037     Clinical Impression Statement [name] tolerated session well and without increased pain.  He did have some muscle fatigue with ther ex, but was able to complete all activities. Pt requires cuing throughout session to look up instead down towards floor, and required cuing with neck retraction into ball on wall. Updated HEP and pt verbalized his understanding.    PT Treatment/Interventions ADLs/Self Care Home Management;Cryotherapy;Electrical Stimulation;Iontophoresis 4mg /ml Dexamethasone;Moist Heat;Traction;Ultrasound;Gait training;Stair training;Functional mobility training;Therapeutic activities;Therapeutic exercise;Balance training;Neuromuscular re-education;Patient/family education;Manual techniques;Passive range of motion;Dry needling;Taping;Vasopneumatic  Device;Spinal Manipulations;Joint Manipulations    PT Next Visit Plan assess and progress HEP, strengthening, ROM, postural corrections    Consulted and Agree with Plan of Care Patient             Patient will benefit from skilled therapeutic intervention in order to improve the following deficits and impairments:  Decreased range of motion, Difficulty walking, Decreased endurance, Increased muscle spasms, Impaired UE functional use, Decreased activity tolerance, Pain, Decreased balance, Impaired flexibility, Improper body mechanics, Decreased strength, Postural dysfunction  Visit Diagnosis: Cervicalgia  Muscle weakness (generalized)  Neck stiffness  Difficulty in walking, not elsewhere classified  Abnormal posture     Problem List Patient Active Problem List   Diagnosis Date Noted   Hypotension 04/01/2021   Lactic acidosis 04/01/2021   Loose stools 04/01/2021   AKI (acute kidney injury) ([place]) 04/01/2021   Syncope 04/01/2021   Near syncope 03/31/2021   Hypoglycemia 10/31/2018   Impaired functional mobility, balance, gait, and endurance 10/31/2018   Dilation of pancreatic duct 10/02/2018   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[place]) 09/28/2018   History of encephalopathy 09/28/2018   Vascular dementia without behavioral disturbance ([place])    History of hepatitis C    Hypothyroidism (acquired)    Gastroesophageal reflux disease    Aneurysm, cerebral, nonruptured    AMS (altered mental status) 08/17/2018   Gait disturbance 08/02/2018   Mild memory disturbance 08/02/2018   Hepatic encephalopathy 07/13/2018   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[place]) 07/13/2018   CKD (chronic kidney disease), stage III ([place]) 07/13/2018   Cervical stenosis of spine 05/29/2018   Degenerative disc disease, cervical 05/29/2018   Iron deficiency anemia due to chronic blood loss 09/12/2017   Rectal bleeding 06/06/2017   Acute renal failure  (ARF) ([place]) 06/06/2017   Hyponatremia 06/06/2017   Hyperkalemia 06/06/2017   Anemia 06/06/2017   GI bleeding 06/06/2017   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[place])    Other dental procedure status 05/22/2017   Family history of CHF (congestive heart failure) 01/31/17   Family history of sudden death 01-31-17   Dyspnea 2016/07/19   Other disorder of circulatory system 04/12/2012   Phlebolithiasis 04/12/2012   Abnormal prostate by palpation 04/11/2012   Benign neoplasm of colon 04/11/2012   Degenerative joint disease of pelvic region 04/11/2012   Disorder of prostate 04/11/2012   Iron excess 04/11/2012   Lumbosacral spondylosis without myelopathy 04/11/2012   Nocturia 04/11/2012   Orthostatic hypotension  04/11/2012   Osteoarthritis of both hips 04/11/2012   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   Left sided sciatica 04/11/2012   History of colonic polyps 10/03/2011    Juel Burrow, PT, DPT 08/03/2021, 11:13 AM  [facility] @ [place], [place], [address] Phone: [phone]   Fax:  [phone]  Name: Justin Hampton MRN: [id number] Date of Birth: [birth year]

## 2021-08-06 ENCOUNTER — Other Ambulatory Visit: Payer: Self-pay

## 2021-08-06 ENCOUNTER — Ambulatory Visit: Payer: Medicare Other | Admitting: Rehabilitative and Restorative Service Providers"

## 2021-08-06 ENCOUNTER — Encounter: Payer: Self-pay | Admitting: Rehabilitative and Restorative Service Providers"

## 2021-08-06 DIAGNOSIS — M6281 Muscle weakness (generalized): Secondary | ICD-10-CM

## 2021-08-06 DIAGNOSIS — M436 Torticollis: Secondary | ICD-10-CM

## 2021-08-06 DIAGNOSIS — R262 Difficulty in walking, not elsewhere classified: Secondary | ICD-10-CM

## 2021-08-06 DIAGNOSIS — M542 Cervicalgia: Secondary | ICD-10-CM | POA: Diagnosis not present

## 2021-08-06 DIAGNOSIS — R293 Abnormal posture: Secondary | ICD-10-CM

## 2021-08-06 NOTE — Therapy (Signed)
[facility] @ [place], [place], [address] Phone: [phone]   Fax:  [phone]  Physical Therapy Treatment  Patient Details  Name: Justin Hampton MRN: [id number] Date of Birth: [birth year] Referring Provider (PT): Justin Abrahams, NP   Encounter Date: 08/06/2021   PT End of Session - 08/06/21 1018     Visit Number 3    Date for PT Re-Evaluation 10/01/21    PT Start Time 1014    PT Stop Time 1055    PT Time Calculation (min) 41 min    Activity Tolerance Patient tolerated treatment well    Behavior During Therapy [facility] for tasks assessed/performed             Past Medical History:  Diagnosis Date   Adenomatous colon polyp    Arthritis    fingers   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   Cirrhosis ([place])    Diverticulosis    GERD (gastroesophageal reflux disease)    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   Hepatic encephalopathy 04/12/2012   Hepatitis C    treated   Hypoglycemia    Hypothyroidism     Past Surgical History:  Procedure Laterality Date   APPENDECTOMY     CARPAL TUNNEL RELEASE Bilateral    COLONOSCOPY     ESOPHAGOGASTRODUODENOSCOPY     x 2   ESOPHAGOGASTRODUODENOSCOPY (EGD) WITH PROPOFOL N/A 10/29/2018   Procedure: ESOPHAGOGASTRODUODENOSCOPY (EGD) WITH PROPOFOL;  Surgeon: Justin Hampton., MD;  Location: [facility];  Service: Gastroenterology;  Laterality: N/A;   EUS N/A 10/29/2018   Procedure: UPPER ENDOSCOPIC ULTRASOUND (EUS) RADIAL;  Surgeon: Justin Hampton., MD;  Location: [facility];  Service: Gastroenterology;  Laterality: N/A;    There were no vitals filed for this visit.   Subjective Assessment - 08/06/21 1017     Subjective I feel ok. 2/10 soreness                               [facility] Adult PT Treatment/Exercise - 08/06/21 0001       Posture/Postural Control   Posture/Postural Control Postural limitations     Postural Limitations Forward head;Rounded Shoulders;Increased thoracic kyphosis      Neck Exercises: Machines for Strengthening   Nustep L5 x6 min      Neck Exercises: Theraband   Scapula Retraction 20 reps;Red    Scapula Retraction Limitations in sitting    Shoulder Extension 20 reps;Red    Shoulder Extension Limitations in standing    Shoulder External Rotation 20 reps;Red    Horizontal ADduction 20 reps;Red      Neck Exercises: Seated   Neck Retraction 20 reps;3 secs    Cervical Rotation Both;10 reps    Shoulder Rolls Backwards;20 reps    Upper Extremity D2 Flexion;10 reps    Theraband Level (UE D2) Level 2 (Red)    Other Seated Exercise Cervical extension x10. Cervical extension with towel to assist x10.      Neck Exercises: Supine   Neck Retraction 10 reps      Manual Therapy   Manual Therapy Joint mobilization;Soft tissue mobilization;Myofascial release;Manual Traction;Passive ROM    Manual therapy comments in supine    Joint Mobilization C-1 , C-2    Soft tissue mobilization upper trap and cervical paraspinal, levator scap    Myofascial Release TPR right upper trap    Passive ROM  C spine rotation, and side bending    Manual Traction gentle cervical x 20 sec x 5                       PT Short Term Goals - 08/03/21 1111       PT SHORT TERM GOAL #1   Title be independent in initial HEP    Status Achieved               PT Long Term Goals - 08/06/21 1205       PT LONG TERM GOAL #2   Title improve FOTO to at least 61%    Status On-going      PT LONG TERM GOAL #3   Title Pt will increase cervical AROM to [facility] to allow him to look up to the microwave and to be able to look behind him to drive.    Status On-going      PT LONG TERM GOAL #4   Title Pt will increase B shoulder and R hip strength to at least 4+/5 to allow him to perform functional activities in his home.    Status On-going      PT LONG TERM GOAL #5   Title Pt will decrease TUG to  14 sec or less with LRAD to decrease his risk of falling.    Status On-going                   Plan - 08/06/21 1204     Clinical Impression Statement [name] tolerated session well.  He had some guarding noted with manual therapy, but as progressed was able to allow for increased movement. Pt continues to require cuing during seated ther ex to look up as much as possible to decrease extreme forward head position. He continues to state compliance with HEP.    PT Treatment/Interventions ADLs/Self Care Home Management;Cryotherapy;Electrical Stimulation;Iontophoresis 4mg /ml Dexamethasone;Moist Heat;Traction;Ultrasound;Gait training;Stair training;Functional mobility training;Therapeutic activities;Therapeutic exercise;Balance training;Neuromuscular re-education;Patient/family education;Manual techniques;Passive range of motion;Dry needling;Taping;Vasopneumatic Device;Spinal Manipulations;Joint Manipulations    PT Next Visit Plan assess and progress HEP, strengthening, ROM, postural corrections    Consulted and Agree with Plan of Care Patient             Patient will benefit from skilled therapeutic intervention in order to improve the following deficits and impairments:  Decreased range of motion, Difficulty walking, Decreased endurance, Increased muscle spasms, Impaired UE functional use, Decreased activity tolerance, Pain, Decreased balance, Impaired flexibility, Improper body mechanics, Decreased strength, Postural dysfunction  Visit Diagnosis: Cervicalgia  Muscle weakness (generalized)  Neck stiffness  Difficulty in walking, not elsewhere classified  Abnormal posture     Problem List Patient Active Problem List   Diagnosis Date Noted   Hypotension 04/01/2021   Lactic acidosis 04/01/2021   Loose stools 04/01/2021   AKI (acute kidney injury) ([place]) 04/01/2021   Syncope 04/01/2021   Near syncope 03/31/2021   Hypoglycemia 10/31/2018   Impaired functional mobility,  balance, gait, and endurance 10/31/2018   Dilation of pancreatic duct 10/02/2018   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   History of encephalopathy 09/28/2018   Vascular dementia without behavioral disturbance (HCC)    History of hepatitis C    Hypothyroidism (acquired)    Gastroesophageal reflux disease    Aneurysm, cerebral, nonruptured    AMS (altered mental status) 08/17/2018   Gait disturbance 08/02/2018   Mild memory disturbance 08/02/2018  Hepatic encephalopathy 07/13/2018   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[place]) 07/13/2018   CKD (chronic kidney disease), stage III ([place]) 07/13/2018   Cervical stenosis of spine 05/29/2018   Degenerative disc disease, cervical 05/29/2018   Iron deficiency anemia due to chronic blood loss 09/12/2017   Rectal bleeding 06/06/2017   Acute renal failure (ARF) ([place]) 06/06/2017   Hyponatremia 06/06/2017   Hyperkalemia 06/06/2017   Anemia 06/06/2017   GI bleeding 06/06/2017   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[place])    Other dental procedure status 05/22/2017   Family history of CHF (congestive heart failure) 02/12/2017   Family history of sudden death 2017/02/12   Dyspnea 07/31/2016   Other disorder of circulatory system 04/12/2012   Phlebolithiasis 04/12/2012   Abnormal prostate by palpation 04/11/2012   Benign neoplasm of colon 04/11/2012   Degenerative joint disease of pelvic region 04/11/2012   Disorder of prostate 04/11/2012   Iron excess 04/11/2012   Lumbosacral spondylosis without myelopathy 04/11/2012   Nocturia 04/11/2012   Orthostatic hypotension 04/11/2012   Osteoarthritis of both hips 04/11/2012   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   Left sided sciatica 04/11/2012   History of colonic polyps 10/03/2011    Juel Burrow, PT, DPT 08/06/2021, 12:06 PM  [facility] @ [place], [place], [address] Phone:  [phone]   Fax:  [phone]  Name: Justin Hampton MRN: [id number] Date of Birth: 30-Jun-1946

## 2021-08-10 ENCOUNTER — Ambulatory Visit: Payer: Medicare Other | Admitting: Rehabilitative and Restorative Service Providers"

## 2021-08-10 ENCOUNTER — Other Ambulatory Visit: Payer: Self-pay

## 2021-08-10 ENCOUNTER — Encounter: Payer: Self-pay | Admitting: Rehabilitative and Restorative Service Providers"

## 2021-08-10 DIAGNOSIS — R262 Difficulty in walking, not elsewhere classified: Secondary | ICD-10-CM

## 2021-08-10 DIAGNOSIS — R293 Abnormal posture: Secondary | ICD-10-CM

## 2021-08-10 DIAGNOSIS — M436 Torticollis: Secondary | ICD-10-CM

## 2021-08-10 DIAGNOSIS — M542 Cervicalgia: Secondary | ICD-10-CM | POA: Diagnosis not present

## 2021-08-10 DIAGNOSIS — M6281 Muscle weakness (generalized): Secondary | ICD-10-CM

## 2021-08-10 NOTE — Therapy (Signed)
[facility] @ [place], [place], [address] Phone: [phone]   Fax:  [phone]  Physical Therapy Treatment  Patient Details  Name: Justin Hampton MRN: [id number] Date of Birth: 1946/05/16 Referring Provider (PT): Eldridge Abrahams, NP   Encounter Date: 08/10/2021   PT End of Session - 08/10/21 1042     Visit Number 4    Date for PT Re-Evaluation 10/01/21    PT Start Time 1013    PT Stop Time 1055    PT Time Calculation (min) 42 min    Activity Tolerance Patient tolerated treatment well    Behavior During Therapy [facility] for tasks assessed/performed             Past Medical History:  Diagnosis Date   Adenomatous colon polyp    Arthritis    fingers   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   Cirrhosis ([place])    Diverticulosis    GERD (gastroesophageal reflux disease)    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   Hepatic encephalopathy 04/12/2012   Hepatitis C    treated   Hypoglycemia    Hypothyroidism     Past Surgical History:  Procedure Laterality Date   APPENDECTOMY     CARPAL TUNNEL RELEASE Bilateral    COLONOSCOPY     ESOPHAGOGASTRODUODENOSCOPY     x 2   ESOPHAGOGASTRODUODENOSCOPY (EGD) WITH PROPOFOL N/A 10/29/2018   Procedure: ESOPHAGOGASTRODUODENOSCOPY (EGD) WITH PROPOFOL;  Surgeon: Irving Copas., MD;  Location: [facility];  Service: Gastroenterology;  Laterality: N/A;   EUS N/A 10/29/2018   Procedure: UPPER ENDOSCOPIC ULTRASOUND (EUS) RADIAL;  Surgeon: Irving Copas., MD;  Location: [facility];  Service: Gastroenterology;  Laterality: N/A;    There were no vitals filed for this visit.   Subjective Assessment - 08/10/21 1041     Subjective I can tell that I am getting a little better. I was able to see the thermostat.    Patient Stated Goals Pt would like to be more upright with a straighter back and to be able to look up to use microwave easily  again.    Currently in Pain? No/denies                               [facility] Adult PT Treatment/Exercise - 08/10/21 0001       Neck Exercises: Machines for Strengthening   Nustep L5 x6 min      Neck Exercises: Theraband   Scapula Retraction 20 reps;Red    Scapula Retraction Limitations in standing    Shoulder Extension 20 reps;Red    Shoulder Extension Limitations in standing    Shoulder External Rotation 20 reps;Red    Horizontal ADduction 20 reps;Red      Neck Exercises: Seated   Neck Retraction 20 reps;3 secs    Neck Retraction Limitations into ball    Cervical Rotation Both;10 reps    W Back 10 reps    Shoulder Rolls Backwards;20 reps    Upper Extremity D2 Flexion;10 reps    Theraband Level (UE D2) Level 2 (Red)    Other Seated Exercise Cervical extension x10. Cervical extension with towel to assist x10.      Manual Therapy   Manual Therapy Joint mobilization;Soft tissue mobilization;Myofascial release;Manual Traction    Manual therapy comments in supine    Joint Mobilization C-1 , C-2  Soft tissue mobilization upper trap and cervical paraspinal, levator scap    Myofascial Release TPR right upper trap    Manual Traction gentle cervical x 20 sec x 5                       PT Short Term Goals - 08/03/21 1111       PT SHORT TERM GOAL #1   Title be independent in initial HEP    Status Achieved               PT Long Term Goals - 08/10/21 1159       PT LONG TERM GOAL #1   Title be independent in advanced HEP    Status On-going                   Plan - 08/10/21 1057     Clinical Impression Statement Mr Leventhal is progressing with goal related activities. He reports at home that he was able to see the thermostat buttons and screen for the first time without having to get a step stool. Following manual therapy, pt does report some feelings of increased ROM and looseness, but does continue to require cuing for relaxing  cervical region during ther ex.    PT Treatment/Interventions ADLs/Self Care Home Management;Cryotherapy;Electrical Stimulation;Iontophoresis 4mg /ml Dexamethasone;Moist Heat;Traction;Ultrasound;Gait training;Stair training;Functional mobility training;Therapeutic activities;Therapeutic exercise;Balance training;Neuromuscular re-education;Patient/family education;Manual techniques;Passive range of motion;Dry needling;Taping;Vasopneumatic Device;Spinal Manipulations;Joint Manipulations    PT Next Visit Plan assess and progress HEP, strengthening, ROM, postural corrections    Consulted and Agree with Plan of Care Patient             Patient will benefit from skilled therapeutic intervention in order to improve the following deficits and impairments:  Decreased range of motion, Difficulty walking, Decreased endurance, Increased muscle spasms, Impaired UE functional use, Decreased activity tolerance, Pain, Decreased balance, Impaired flexibility, Improper body mechanics, Decreased strength, Postural dysfunction  Visit Diagnosis: Cervicalgia  Muscle weakness (generalized)  Neck stiffness  Difficulty in walking, not elsewhere classified  Abnormal posture     Problem List Patient Active Problem List   Diagnosis Date Noted   Hypotension 04/01/2021   Lactic acidosis 04/01/2021   Loose stools 04/01/2021   AKI (acute kidney injury) ([place]) 04/01/2021   Syncope 04/01/2021   Near syncope 03/31/2021   Hypoglycemia 10/31/2018   Impaired functional mobility, balance, gait, and endurance 10/31/2018   Dilation of pancreatic duct 10/02/2018   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[place]) 09/28/2018   History of encephalopathy 09/28/2018   Vascular dementia without behavioral disturbance (HCC)    History of hepatitis C    Hypothyroidism (acquired)    Gastroesophageal reflux disease    Aneurysm, cerebral, nonruptured    AMS (altered mental status)  08/17/2018   Gait disturbance 08/02/2018   Mild memory disturbance 08/02/2018   Hepatic encephalopathy 07/13/2018   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[place]) 07/13/2018   CKD (chronic kidney disease), stage III ([place]) 07/13/2018   Cervical stenosis of spine 05/29/2018   Degenerative disc disease, cervical 05/29/2018   Iron deficiency anemia due to chronic blood loss 09/12/2017   Rectal bleeding 06/06/2017   Acute renal failure (ARF) ([place]) 06/06/2017   Hyponatremia 06/06/2017   Hyperkalemia 06/06/2017   Anemia 06/06/2017   GI bleeding 06/06/2017   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[place])    Other dental procedure status 05/22/2017   Family history of CHF (congestive heart failure)  01-27-2017   Family history of sudden death 01-27-17   Dyspnea 15-Jul-2016   Other disorder of circulatory system 04/12/2012   Phlebolithiasis 04/12/2012   Abnormal prostate by palpation 04/11/2012   Benign neoplasm of colon 04/11/2012   Degenerative joint disease of pelvic region 04/11/2012   Disorder of prostate 04/11/2012   Iron excess 04/11/2012   Lumbosacral spondylosis without myelopathy 04/11/2012   Nocturia 04/11/2012   Orthostatic hypotension 04/11/2012   Osteoarthritis of both hips 04/11/2012   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   Left sided sciatica 04/11/2012   History of colonic polyps 10/03/2011    Juel Burrow, PT, DPT 08/10/2021, 12:00 PM  [facility] @ [place], [place], [address] Phone: [phone]   Fax:  [phone]  Name: Cortlandt Capuano MRN: [id number] Date of Birth: [birth year]

## 2021-08-13 ENCOUNTER — Ambulatory Visit: Payer: Medicare Other | Admitting: Rehabilitative and Restorative Service Providers"

## 2021-08-13 ENCOUNTER — Encounter: Payer: Self-pay | Admitting: Rehabilitative and Restorative Service Providers"

## 2021-08-13 ENCOUNTER — Other Ambulatory Visit: Payer: Self-pay

## 2021-08-13 DIAGNOSIS — M6281 Muscle weakness (generalized): Secondary | ICD-10-CM

## 2021-08-13 DIAGNOSIS — R293 Abnormal posture: Secondary | ICD-10-CM

## 2021-08-13 DIAGNOSIS — M542 Cervicalgia: Secondary | ICD-10-CM | POA: Diagnosis not present

## 2021-08-13 DIAGNOSIS — R262 Difficulty in walking, not elsewhere classified: Secondary | ICD-10-CM

## 2021-08-13 DIAGNOSIS — M436 Torticollis: Secondary | ICD-10-CM

## 2021-08-13 NOTE — Therapy (Signed)
[facility] @ [place], [place], [address] Phone: [phone]   Fax:  [phone]  Physical Therapy Treatment  Patient Details  Name: Justin Hampton MRN: [id number] Date of Birth: 05-01-1946 Referring Provider (PT): Eldridge Abrahams, NP   Encounter Date: 08/13/2021   PT End of Session - 08/13/21 1032     Visit Number 5    Date for PT Re-Evaluation 10/01/21    PT Start Time 6962    PT Stop Time 1055    PT Time Calculation (min) 40 min    Activity Tolerance Patient tolerated treatment well    Behavior During Therapy [facility] for tasks assessed/performed             Past Medical History:  Diagnosis Date   Adenomatous colon polyp    Arthritis    fingers   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   Cirrhosis ([place])    Diverticulosis    GERD (gastroesophageal reflux disease)    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   Hepatic encephalopathy 04/12/2012   Hepatitis C    treated   Hypoglycemia    Hypothyroidism     Past Surgical History:  Procedure Laterality Date   APPENDECTOMY     CARPAL TUNNEL RELEASE Bilateral    COLONOSCOPY     ESOPHAGOGASTRODUODENOSCOPY     x 2   ESOPHAGOGASTRODUODENOSCOPY (EGD) WITH PROPOFOL N/A 10/29/2018   Procedure: ESOPHAGOGASTRODUODENOSCOPY (EGD) WITH PROPOFOL;  Surgeon: Irving Copas., MD;  Location: [facility];  Service: Gastroenterology;  Laterality: N/A;   EUS N/A 10/29/2018   Procedure: UPPER ENDOSCOPIC ULTRASOUND (EUS) RADIAL;  Surgeon: Irving Copas., MD;  Location: [facility];  Service: Gastroenterology;  Laterality: N/A;    There were no vitals filed for this visit.   Subjective Assessment - 08/13/21 1031     Subjective I am doing okay.    Patient Stated Goals Pt would like to be more upright with a straighter back and to be able to look up to use microwave easily again.    Currently in Pain? No/denies                                [facility] Adult PT Treatment/Exercise - 08/13/21 0001       Neck Exercises: Machines for Strengthening   Nustep L5 x6 min with PT supervision to update on progress.      Neck Exercises: Theraband   Scapula Retraction 20 reps;Red    Scapula Retraction Limitations in standing    Shoulder Extension 20 reps;Red    Shoulder Extension Limitations in standing    Shoulder External Rotation 20 reps;Red    Horizontal ADduction 20 reps;Red      Neck Exercises: Seated   Neck Retraction 20 reps;3 secs    Neck Retraction Limitations into ball    Cervical Rotation Both;10 reps    W Back 20 reps    Upper Extremity D2 Flexion;10 reps    Theraband Level (UE D2) Level 2 (Red)    Other Seated Exercise Cervical extension x10.                       PT Short Term Goals - 08/03/21 1111       PT SHORT TERM GOAL #1   Title be independent in initial HEP    Status Achieved  PT Long Term Goals - 08/10/21 1159       PT LONG TERM GOAL #1   Title be independent in advanced HEP    Status On-going                   Plan - 08/13/21 1033     Clinical Impression Statement Mr Crystal states that he feels that he has made approximately 75% since starting working with PT and doing his HEP. Pt requires min cuing throughout to cue pt to lift his head to avoid extreme cervical flexion. Pt continues to make progress towards goal related activities and is improving with functional activities in his home, such as ability to see his thermostat.    PT Treatment/Interventions ADLs/Self Care Home Management;Cryotherapy;Electrical Stimulation;Iontophoresis 4mg /ml Dexamethasone;Moist Heat;Traction;Ultrasound;Gait training;Stair training;Functional mobility training;Therapeutic activities;Therapeutic exercise;Balance training;Neuromuscular re-education;Patient/family education;Manual techniques;Passive range of motion;Dry  needling;Taping;Vasopneumatic Device;Spinal Manipulations;Joint Manipulations    PT Next Visit Plan assess and progress HEP, strengthening, ROM, postural corrections    Consulted and Agree with Plan of Care Patient             Patient will benefit from skilled therapeutic intervention in order to improve the following deficits and impairments:  Decreased range of motion, Difficulty walking, Decreased endurance, Increased muscle spasms, Impaired UE functional use, Decreased activity tolerance, Pain, Decreased balance, Impaired flexibility, Improper body mechanics, Decreased strength, Postural dysfunction  Visit Diagnosis: Cervicalgia  Muscle weakness (generalized)  Neck stiffness  Difficulty in walking, not elsewhere classified  Abnormal posture     Problem List Patient Active Problem List   Diagnosis Date Noted   Hypotension 04/01/2021   Lactic acidosis 04/01/2021   Loose stools 04/01/2021   AKI (acute kidney injury) ([place]) 04/01/2021   Syncope 04/01/2021   Near syncope 03/31/2021   Hypoglycemia 10/31/2018   Impaired functional mobility, balance, gait, and endurance 10/31/2018   Dilation of pancreatic duct 10/02/2018   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   History of encephalopathy 09/28/2018   Vascular dementia without behavioral disturbance ([place])    History of hepatitis C    Hypothyroidism (acquired)    Gastroesophageal reflux disease    Aneurysm, cerebral, nonruptured    AMS (altered mental status) 08/17/2018   Gait disturbance 08/02/2018   Mild memory disturbance 08/02/2018   Hepatic encephalopathy 07/13/2018   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[place]) 07/13/2018   CKD (chronic kidney disease), stage III ([place]) 07/13/2018   Cervical stenosis of spine 05/29/2018   Degenerative disc disease, cervical 05/29/2018   Iron deficiency anemia due to chronic blood loss 09/12/2017   Rectal bleeding  06/06/2017   Acute renal failure (ARF) ([place]) 06/06/2017   Hyponatremia 06/06/2017   Hyperkalemia 06/06/2017   Anemia 06/06/2017   GI bleeding 06/06/2017   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[place])    Other dental procedure status 05/22/2017   Family history of CHF (congestive heart failure) 2017/02/05   Family history of sudden death 02/05/2017   Dyspnea 07/24/2016   Other disorder of circulatory system 04/12/2012   Phlebolithiasis 04/12/2012   Abnormal prostate by palpation 04/11/2012   Benign neoplasm of colon 04/11/2012   Degenerative joint disease of pelvic region 04/11/2012   Disorder of prostate 04/11/2012   Iron excess 04/11/2012   Lumbosacral spondylosis without myelopathy 04/11/2012   Nocturia 04/11/2012   Orthostatic hypotension 04/11/2012   Osteoarthritis of both hips 04/11/2012   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   Left sided sciatica 04/11/2012  History of colonic polyps 10/03/2011    Juel Burrow, PT, DPT 08/13/2021, 11:03 AM  [facility] @ [place], [place], [address] Phone: [phone]   Fax:  [phone]  Name: Justin Hampton MRN: [id number] Date of Birth: 26-Jun-1946

## 2021-08-17 ENCOUNTER — Encounter: Payer: Medicare Other | Admitting: Rehabilitative and Restorative Service Providers"

## 2021-08-20 ENCOUNTER — Encounter: Payer: Self-pay | Admitting: Rehabilitative and Restorative Service Providers"

## 2021-08-20 ENCOUNTER — Other Ambulatory Visit: Payer: Self-pay

## 2021-08-20 ENCOUNTER — Ambulatory Visit: Payer: Medicare Other | Admitting: Rehabilitative and Restorative Service Providers"

## 2021-08-20 DIAGNOSIS — M542 Cervicalgia: Secondary | ICD-10-CM | POA: Diagnosis not present

## 2021-08-20 DIAGNOSIS — M436 Torticollis: Secondary | ICD-10-CM

## 2021-08-20 DIAGNOSIS — M6281 Muscle weakness (generalized): Secondary | ICD-10-CM

## 2021-08-20 DIAGNOSIS — R293 Abnormal posture: Secondary | ICD-10-CM

## 2021-08-20 DIAGNOSIS — R262 Difficulty in walking, not elsewhere classified: Secondary | ICD-10-CM

## 2021-08-20 NOTE — Therapy (Signed)
[facility] @ [place], [place], [address] Phone: [phone]   Fax:  [phone]  Physical Therapy Treatment  Patient Details  Name: Justin Hampton MRN: [id number] Date of Birth: 1945/12/03 Referring Provider (PT): Eldridge Abrahams, NP   Encounter Date: 08/20/2021   PT End of Session - 08/20/21 1043     Visit Number 6    Date for PT Re-Evaluation 10/01/21    PT Start Time 2979    PT Stop Time 1055    PT Time Calculation (min) 40 min    Activity Tolerance Patient tolerated treatment well    Behavior During Therapy [facility] for tasks assessed/performed             Past Medical History:  Diagnosis Date   Adenomatous colon polyp    Arthritis    fingers   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   Cirrhosis ([place])    Diverticulosis    GERD (gastroesophageal reflux disease)    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   Hepatic encephalopathy 04/12/2012   Hepatitis C    treated   Hypoglycemia    Hypothyroidism     Past Surgical History:  Procedure Laterality Date   APPENDECTOMY     CARPAL TUNNEL RELEASE Bilateral    COLONOSCOPY     ESOPHAGOGASTRODUODENOSCOPY     x 2   ESOPHAGOGASTRODUODENOSCOPY (EGD) WITH PROPOFOL N/A 10/29/2018   Procedure: ESOPHAGOGASTRODUODENOSCOPY (EGD) WITH PROPOFOL;  Surgeon: Irving Copas., MD;  Location: [place];  Service: Gastroenterology;  Laterality: N/A;   EUS N/A 10/29/2018   Procedure: UPPER ENDOSCOPIC ULTRASOUND (EUS) RADIAL;  Surgeon: Irving Copas., MD;  Location: [place];  Service: Gastroenterology;  Laterality: N/A;    There were no vitals filed for this visit.   Subjective Assessment - 08/20/21 1024     Subjective I was doing some of my stretches and I pushed too far and was in too much pain to come on Tuesday.    Patient Stated Goals Pt would like to be more upright with a straighter back and to be able to look up to use  microwave easily again.    Currently in Pain? Yes    Pain Score 1     Pain Location Neck    Pain Orientation Posterior                [facility] PT Assessment - 08/20/21 0001       AROM   Overall AROM Comments cervical extension missing 12 degrees from fulling upright.  R cervial rotation 30 degrees, L cervical rotation 35 degrees                           [facility] Adult PT Treatment/Exercise - 08/20/21 0001       Neck Exercises: Machines for Strengthening   Nustep L5 x6 min with PT supervision to update on progress.      Neck Exercises: Theraband   Scapula Retraction 20 reps;Red    Shoulder Extension 20 reps;Red    Shoulder External Rotation 20 reps;Red    Horizontal ADduction 20 reps;Red      Neck Exercises: Seated   Neck Retraction 20 reps;3 secs    Neck Retraction Limitations into ball    Cervical Rotation Both;20 reps    W Back 20 reps    Shoulder Rolls Backwards;20 reps    Upper Extremity D2 Flexion;10  reps    Theraband Level (UE D2) Level 2 (Red)    Other Seated Exercise Cervical extension x20.      Neck Exercises: Stretches   Corner Stretch 2 reps;20 seconds                     PT Education - 08/20/21 1057     Education Details Pt educated on pursed lip breathing secondary to his reports of having dyspnea.    Person(s) Educated Patient    Methods Explanation;Demonstration;Verbal cues    Comprehension Verbalized understanding;Returned demonstration              PT Short Term Goals - 08/03/21 1111       PT SHORT TERM GOAL #1   Title be independent in initial HEP    Status Achieved               PT Long Term Goals - 08/20/21 [place] #1   Title be independent in advanced HEP    Status On-going      PT LONG TERM GOAL #2   Title improve FOTO to at least 61%    Status On-going      PT LONG TERM GOAL #3   Title Pt will increase cervical AROM to [facility] to allow him to look up to the microwave  and to be able to look behind him to drive.    Status On-going      PT LONG TERM GOAL #4   Title Pt will increase B shoulder and R hip strength to at least 4+/5 to allow him to perform functional activities in his home.    Status On-going      PT LONG TERM GOAL #5   Title Pt will decrease TUG to 14 sec or less with LRAD to decrease his risk of falling.    Status On-going                   Plan - 08/20/21 1058     Clinical Impression Statement Pt limited today to primarily exercises in sitting secondary to his R leg pain from overstetching earlier this week. Pt reported feeling better with is breathing following education on pursed lip breathing and corner stretch. Pt with cuing and increased time able to achieve improved cervical extension.    PT Treatment/Interventions ADLs/Self Care Home Management;Cryotherapy;Electrical Stimulation;Iontophoresis 4mg /ml Dexamethasone;Moist Heat;Traction;Ultrasound;Gait training;Stair training;Functional mobility training;Therapeutic activities;Therapeutic exercise;Balance training;Neuromuscular re-education;Patient/family education;Manual techniques;Passive range of motion;Dry needling;Taping;Vasopneumatic Device;Spinal Manipulations;Joint Manipulations    PT Next Visit Plan assess and progress HEP, strengthening, ROM, postural corrections    Consulted and Agree with Plan of Care Patient             Patient will benefit from skilled therapeutic intervention in order to improve the following deficits and impairments:  Decreased range of motion, Difficulty walking, Decreased endurance, Increased muscle spasms, Impaired UE functional use, Decreased activity tolerance, Pain, Decreased balance, Impaired flexibility, Improper body mechanics, Decreased strength, Postural dysfunction  Visit Diagnosis: Cervicalgia  Muscle weakness (generalized)  Neck stiffness  Difficulty in walking, not elsewhere classified  Abnormal posture     Problem  List Patient Active Problem List   Diagnosis Date Noted   Hypotension 04/01/2021   Lactic acidosis 04/01/2021   Loose stools 04/01/2021   AKI (acute kidney injury) ([place]) 04/01/2021   Syncope 04/01/2021   Near syncope 03/31/2021   Hypoglycemia 10/31/2018   Impaired functional mobility,  balance, gait, and endurance 10/31/2018   Dilation of pancreatic duct 10/02/2018   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[place]) 09/28/2018   History of encephalopathy 09/28/2018   Vascular dementia without behavioral disturbance ([place])    History of hepatitis C    Hypothyroidism (acquired)    Gastroesophageal reflux disease    Aneurysm, cerebral, nonruptured    AMS (altered mental status) 08/17/2018   Gait disturbance 08/02/2018   Mild memory disturbance 08/02/2018   Hepatic encephalopathy 07/13/2018   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[place]) 07/13/2018   CKD (chronic kidney disease), stage III ([place]) 07/13/2018   Cervical stenosis of spine 05/29/2018   Degenerative disc disease, cervical 05/29/2018   Iron deficiency anemia due to chronic blood loss 09/12/2017   Rectal bleeding 06/06/2017   Acute renal failure (ARF) ([place]) 06/06/2017   Hyponatremia 06/06/2017   Hyperkalemia 06/06/2017   Anemia 06/06/2017   GI bleeding 06/06/2017   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[place])    Other dental procedure status 05/22/2017   Family history of CHF (congestive heart failure) 01-28-17   Family history of sudden death Jan 28, 2017   Dyspnea 07/16/2016   Other disorder of circulatory system 04/12/2012   Phlebolithiasis 04/12/2012   Abnormal prostate by palpation 04/11/2012   Benign neoplasm of colon 04/11/2012   Degenerative joint disease of pelvic region 04/11/2012   Disorder of prostate 04/11/2012   Iron excess 04/11/2012   Lumbosacral spondylosis without myelopathy 04/11/2012   Nocturia 04/11/2012   Orthostatic hypotension 04/11/2012   Osteoarthritis of both hips 04/11/2012    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   Left sided sciatica 04/11/2012   History of colonic polyps 10/03/2011    Juel Burrow, PT, DPT 08/20/2021, 11:29 AM  [facility] @ [place], [place], [address] Phone: [phone]   Fax:  [phone]  Name: Justin Hampton MRN: [id number] Date of Birth: Jun 06, 1946

## 2021-08-24 ENCOUNTER — Encounter: Payer: Medicare Other | Admitting: Rehabilitative and Restorative Service Providers"

## 2021-08-27 ENCOUNTER — Ambulatory Visit: Payer: Medicare Other | Attending: Nurse Practitioner | Admitting: Rehabilitative and Restorative Service Providers"

## 2021-08-27 ENCOUNTER — Encounter: Payer: Self-pay | Admitting: Rehabilitative and Restorative Service Providers"

## 2021-08-27 ENCOUNTER — Other Ambulatory Visit: Payer: Self-pay

## 2021-08-27 DIAGNOSIS — M6281 Muscle weakness (generalized): Secondary | ICD-10-CM | POA: Insufficient documentation

## 2021-08-27 DIAGNOSIS — R262 Difficulty in walking, not elsewhere classified: Secondary | ICD-10-CM | POA: Diagnosis present

## 2021-08-27 DIAGNOSIS — M436 Torticollis: Secondary | ICD-10-CM | POA: Insufficient documentation

## 2021-08-27 DIAGNOSIS — R293 Abnormal posture: Secondary | ICD-10-CM | POA: Diagnosis present

## 2021-08-27 DIAGNOSIS — M542 Cervicalgia: Secondary | ICD-10-CM | POA: Insufficient documentation

## 2021-08-27 NOTE — Therapy (Signed)
[facility] @ [place] [address] [place], [place], [address] Phone: [phone]   Fax:  [phone]  Physical Therapy Treatment  Patient Details  Name: Justin Hampton MRN: [id number] Date of Birth: 01/20/1946 Referring Provider (PT): Penny Jones, NP   Encounter Date: 08/27/2021   PT End of Session - 08/27/21 1000     Visit Number 7    Date for PT Re-Evaluation 10/01/21    PT Start Time 0958    PT Stop Time 1040    PT Time Calculation (min) 42 min    Activity Tolerance Patient tolerated treatment well    Behavior During Therapy [facility] for tasks assessed/performed             Past Medical History:  Diagnosis Date   Adenomatous colon polyp    Arthritis    fingers   Blood clotting factor deficiency disorder (H[facility])    Factor 9   Cirrhosis (H[facility])    Diverticulosis    GERD (gastroesophageal reflux disease)    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H[facility])    Hemophilia B   Hepatic encephalopathy 04/12/2012   Hepatitis C    treated   Hypoglycemia    Hypothyroidism     Past Surgical History:  Procedure Laterality Date   APPENDECTOMY     CARPAL TUNNEL RELEASE Bilateral    COLONOSCOPY     ESOPHAGOGASTRODUODENOSCOPY     x 2   ESOPHAGOGASTRODUODENOSCOPY (EGD) WITH PROPOFOL N/A 10/29/2018   Procedure: ESOPHAGOGASTRODUODENOSCOPY (EGD) WITH PROPOFOL;  Surgeon: [name], Gabriel Jr., MD;  [facility];  Service: Gastroenterology;  Laterality: N/A;   EUS N/A 10/29/2018   Procedure: UPPER ENDOSCOPIC ULTRASOUND (EUS) RADIAL;  Surgeon: [name], Gabriel Jr., MD;  [facility];  Service: Gastroenterology;  Laterality: N/A;    There were no vitals filed for this visit.   Subjective Assessment - 08/27/21 0959     Subjective I got to see my face in the mirror so I could shave.  It's progress.    Patient Stated Goals Pt would like to be more upright with a straighter back and to be able to look up to use microwave easily again.    Currently  in Pain? No/denies                [facility] PT Assessment - 08/27/21 0001       AROM   Overall AROM Comments cervical rotation 40degrees B directions                           [facility] Adult PT Treatment/Exercise - 08/27/21 0001       Neck Exercises: Machines for Strengthening   Nustep L5 x6 min with PT supervision to update on progress.   408 steps     Neck Exercises: Theraband   Scapula Retraction 20 reps;Red    Shoulder Extension 20 reps;Red    Shoulder External Rotation 20 reps;Red    Horizontal ABduction 20 reps;Red      Neck Exercises: Standing   Wall Push Ups 20 reps      Neck Exercises: Seated   Neck Retraction 20 reps;3 secs    Neck Retraction Limitations into ball    Cervical Rotation Both;20 reps    W Back 20 reps    Upper Extremity D2 Flexion;10 reps    Theraband Level (UE D2) Level 2 (Red)    Other Seated Exercise Cervical extension x20.      Neck Exercises: Stretches     Corner Stretch 2 reps;20 seconds    Other Neck Stretches Ambulating down hallway looking up to search for post-it notes and reaching up to retreive x8      Manual Therapy   Manual Therapy Soft tissue mobilization;Myofascial release    Manual therapy comments in sitting    Soft tissue mobilization upper trap and cervical paraspinal, levator scap    Myofascial Release manual trigger point release to B upper traps                       PT Short Term Goals - 08/27/21 1053       PT SHORT TERM GOAL #1   Title be independent in initial HEP    Status Achieved               PT Long Term Goals - 08/27/21 1053       PT LONG TERM GOAL #1   Title be independent in advanced HEP    Status Partially Met      PT LONG TERM GOAL #2   Title improve FOTO to at least 61%    Status On-going      PT LONG TERM GOAL #3   Title Pt will increase cervical AROM to [facility] to allow him to look up to the microwave and to be able to look behind him to drive.    Status  Partially Met      PT LONG TERM GOAL #4   Title Pt will increase B shoulder and R hip strength to at least 4+/5 to allow him to perform functional activities in his home.    Status On-going      PT LONG TERM GOAL #5   Title Pt will decrease TUG to 14 sec or less with LRAD to decrease his risk of falling.    Status On-going                   Plan - 08/27/21 1046     Clinical Impression Statement Justin Hampton reports feeling better today after his medications, including a dose of steriods. Pt is progressing with increased AROM and increased UE strength. Pt reports that he now feels that he is 70% better than when he started PT and he has been able to achieve most of his goals, but still has some difficulty with showering without use of shower bench. Pt continues with compliance with HEP and trying to improve his posture when at home.    PT Treatment/Interventions ADLs/Self Care Home Management;Cryotherapy;Electrical Stimulation;Iontophoresis 61m/ml Dexamethasone;Moist Heat;Traction;Ultrasound;Gait training;Stair training;Functional mobility training;Therapeutic activities;Therapeutic exercise;Balance training;Neuromuscular re-education;Patient/family education;Manual techniques;Passive range of motion;Dry needling;Taping;Vasopneumatic Device;Spinal Manipulations;Joint Manipulations    PT Next Visit Plan assess and progress HEP, strengthening, ROM, postural corrections    Consulted and Agree with Plan of Care Patient             Patient will benefit from skilled therapeutic intervention in order to improve the following deficits and impairments:  Decreased range of motion, Difficulty walking, Decreased endurance, Increased muscle spasms, Impaired UE functional use, Decreased activity tolerance, Pain, Decreased balance, Impaired flexibility, Improper body mechanics, Decreased strength, Postural dysfunction  Visit Diagnosis: Cervicalgia  Muscle weakness (generalized)  Neck  stiffness  Difficulty in walking, not elsewhere classified  Abnormal posture     Problem List Patient Active Problem List   Diagnosis Date Noted   Hypotension 04/01/2021   Lactic acidosis 04/01/2021   Loose stools 04/01/2021   AKI (acute kidney injury) (  [place]) 04/01/2021   Syncope 04/01/2021   Near syncope 03/31/2021   Hypoglycemia 10/31/2018   Impaired functional mobility, balance, gait, and endurance 10/31/2018   Dilation of pancreatic duct 10/02/2018   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[place]) 09/28/2018   History of encephalopathy 09/28/2018   Vascular dementia without behavioral disturbance ([place])    History of hepatitis C    Hypothyroidism (acquired)    Gastroesophageal reflux disease    Aneurysm, cerebral, nonruptured    AMS (altered mental status) 08/17/2018   Gait disturbance 08/02/2018   Mild memory disturbance 08/02/2018   Hepatic encephalopathy 07/13/2018   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[place]) 07/13/2018   CKD (chronic kidney disease), stage III ([place]) 07/13/2018   Cervical stenosis of spine 05/29/2018   Degenerative disc disease, cervical 05/29/2018   Iron deficiency anemia due to chronic blood loss 09/12/2017   Rectal bleeding 06/06/2017   Acute renal failure (ARF) ([place]) 06/06/2017   Hyponatremia 06/06/2017   Hyperkalemia 06/06/2017   Anemia 06/06/2017   GI bleeding 06/06/2017   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[place])    Other dental procedure status 05/22/2017   Family history of CHF (congestive heart failure) 2017/02/04   Family history of sudden death Feb 04, 2017   Dyspnea 2016-07-23   Other disorder of circulatory system 04/12/2012   Phlebolithiasis 04/12/2012   Abnormal prostate by palpation 04/11/2012   Benign neoplasm of colon 04/11/2012   Degenerative joint disease of pelvic region 04/11/2012   Disorder of prostate 04/11/2012   Iron excess 04/11/2012   Lumbosacral spondylosis without myelopathy 04/11/2012    Nocturia 04/11/2012   Orthostatic hypotension 04/11/2012   Osteoarthritis of both hips 04/11/2012   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   Left sided sciatica 04/11/2012   History of colonic polyps 10/03/2011    Juel Burrow, PT, DPT 08/27/2021, 10:57 AM  [facility] @ [place], [place], [address] Phone: [phone]   Fax:  [phone]  Name: Justin Hampton MRN: [id number] Date of Birth: January 06, 1946

## 2021-08-31 ENCOUNTER — Encounter: Payer: Self-pay | Admitting: Rehabilitative and Restorative Service Providers"

## 2021-08-31 ENCOUNTER — Ambulatory Visit: Payer: Medicare Other | Admitting: Rehabilitative and Restorative Service Providers"

## 2021-08-31 ENCOUNTER — Other Ambulatory Visit: Payer: Self-pay

## 2021-08-31 DIAGNOSIS — M436 Torticollis: Secondary | ICD-10-CM

## 2021-08-31 DIAGNOSIS — M542 Cervicalgia: Secondary | ICD-10-CM

## 2021-08-31 DIAGNOSIS — R293 Abnormal posture: Secondary | ICD-10-CM

## 2021-08-31 DIAGNOSIS — M6281 Muscle weakness (generalized): Secondary | ICD-10-CM

## 2021-08-31 DIAGNOSIS — R262 Difficulty in walking, not elsewhere classified: Secondary | ICD-10-CM

## 2021-08-31 NOTE — Therapy (Signed)
[facility] @ [place], [place], [address] Phone: [phone]   Fax:  [phone]  Physical Therapy Treatment  Patient Details  Name: Justin Hampton MRN: [id number] Date of Birth: Apr 03, 1946 Referring Provider (PT): Eldridge Abrahams, NP   Encounter Date: 08/31/2021   PT End of Session - 08/31/21 1020     Visit Number 8    Date for PT Re-Evaluation 10/01/21    PT Start Time 4431    PT Stop Time 1055    PT Time Calculation (min) 40 min    Activity Tolerance Patient tolerated treatment well    Behavior During Therapy [facility] for tasks assessed/performed             Past Medical History:  Diagnosis Date   Adenomatous colon polyp    Arthritis    fingers   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   Cirrhosis ([place])    Diverticulosis    GERD (gastroesophageal reflux disease)    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   Hepatic encephalopathy 04/12/2012   Hepatitis C    treated   Hypoglycemia    Hypothyroidism     Past Surgical History:  Procedure Laterality Date   APPENDECTOMY     CARPAL TUNNEL RELEASE Bilateral    COLONOSCOPY     ESOPHAGOGASTRODUODENOSCOPY     x 2   ESOPHAGOGASTRODUODENOSCOPY (EGD) WITH PROPOFOL N/A 10/29/2018   Procedure: ESOPHAGOGASTRODUODENOSCOPY (EGD) WITH PROPOFOL;  Surgeon: Irving Copas., MD;  Location: [facility];  Service: Gastroenterology;  Laterality: N/A;   EUS N/A 10/29/2018   Procedure: UPPER ENDOSCOPIC ULTRASOUND (EUS) RADIAL;  Surgeon: Irving Copas., MD;  [facility];  Service: Gastroenterology;  Laterality: N/A;    There were no vitals filed for this visit.   Subjective Assessment - 08/31/21 1019     Subjective I still feel a little hunched over.    Patient Stated Goals Pt would like to be more upright with a straighter back and to be able to look up to use microwave easily again.    Currently in Pain? No/denies                                [facility] Adult PT Treatment/Exercise - 08/31/21 0001       Neck Exercises: Machines for Strengthening   Nustep L5 x7 min with PT supervision to update on progress.      Neck Exercises: Theraband   Shoulder Extension 20 reps;Red    Rows 20 reps;Red    Shoulder External Rotation 20 reps;Red    Horizontal ABduction 20 reps;Red      Neck Exercises: Standing   Wall Push Ups 20 reps      Neck Exercises: Seated   Neck Retraction 20 reps;3 secs    Neck Retraction Limitations into ball    W Back 20 reps    Upper Extremity D2 Flexion;10 reps   2x10 reps, first set in standing, second set in sitting   Theraband Level (UE D2) Level 2 (Red)    Other Seated Exercise Cervical extension x20.      Neck Exercises: Stretches   Corner Stretch 2 reps;20 seconds                       PT Short Term Goals - 08/27/21 1053       PT  SHORT TERM GOAL #1   Title be independent in initial HEP    Status Achieved               PT Long Term Goals - 08/31/21 1110       PT LONG TERM GOAL #3   Title Pt will increase cervical AROM to [facility] to allow him to look up to the microwave and to be able to look behind him to drive.    Status Partially Met      PT LONG TERM GOAL #4   Title Pt will increase B shoulder and R hip strength to at least 4+/5 to allow him to perform functional activities in his home.    Status On-going                   Plan - 08/31/21 1107     Clinical Impression Statement Justin Hampton continues to progress towards goal related activities and reporting that he is feeling better from his illness.  Pt continues to require cuing during session for cervical extension secondary to long habit of forward flexion. Pt reports that he was able to step into the shower for the first time since last visit and that it felt good not using the bench.    PT Treatment/Interventions ADLs/Self Care Home Management;Cryotherapy;Electrical  Stimulation;Iontophoresis 67m/ml Dexamethasone;Moist Heat;Traction;Ultrasound;Gait training;Stair training;Functional mobility training;Therapeutic activities;Therapeutic exercise;Balance training;Neuromuscular re-education;Patient/family education;Manual techniques;Passive range of motion;Dry needling;Taping;Vasopneumatic Device;Spinal Manipulations;Joint Manipulations    PT Next Visit Plan assess and progress HEP, strengthening, ROM, postural corrections    Consulted and Agree with Plan of Care Patient             Patient will benefit from skilled therapeutic intervention in order to improve the following deficits and impairments:  Decreased range of motion, Difficulty walking, Decreased endurance, Increased muscle spasms, Impaired UE functional use, Decreased activity tolerance, Pain, Decreased balance, Impaired flexibility, Improper body mechanics, Decreased strength, Postural dysfunction  Visit Diagnosis: Muscle weakness (generalized)  Cervicalgia  Neck stiffness  Difficulty in walking, not elsewhere classified  Abnormal posture     Problem List Patient Active Problem List   Diagnosis Date Noted   Hypotension 04/01/2021   Lactic acidosis 04/01/2021   Loose stools 04/01/2021   AK[place] (acute kidney injury) ([place] 04/01/2021   Syncope 04/01/2021   Near syncope 03/31/2021   Hypoglycemia 10/31/2018   Impaired functional mobility, balance, gait, and endurance 10/31/2018   Dilation of pancreatic duct 10/02/2018   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[place] 09/28/2018   History of encephalopathy 09/28/2018   Vascular dementia without behavioral disturbance (HCC)    History of hepatitis C    Hypothyroidism (acquired)    Gastroesophageal reflux disease    Aneurysm, cerebral, nonruptured    AMS (altered mental status) 08/17/2018   Gait disturbance 08/02/2018   Mild memory disturbance 08/02/2018   Hepatic encephalopathy 07/13/2018    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[place] 07/13/2018   CKD (chronic kidney disease), stage III ([place] 07/13/2018   Cervical stenosis of spine 05/29/2018   Degenerative disc disease, cervical 05/29/2018   Iron deficiency anemia due to chronic blood loss 09/12/2017   Rectal bleeding 06/06/2017   Acute renal failure (ARF) ([place] 06/06/2017   Hyponatremia 06/06/2017   Hyperkalemia 06/06/2017   Anemia 06/06/2017   GI bleeding 06/06/2017   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[place]    Other dental procedure status 05/22/2017   Family history of CHF (congestive heart failure)  02/02/2017   Family history of sudden death 02/02/17   Dyspnea 07/21/2016   Other disorder of circulatory system 04/12/2012   Phlebolithiasis 04/12/2012   Abnormal prostate by palpation 04/11/2012   Benign neoplasm of colon 04/11/2012   Degenerative joint disease of pelvic region 04/11/2012   Disorder of prostate 04/11/2012   Iron excess 04/11/2012   Lumbosacral spondylosis without myelopathy 04/11/2012   Nocturia 04/11/2012   Orthostatic hypotension 04/11/2012   Osteoarthritis of both hips 04/11/2012   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   Left sided sciatica 04/11/2012   History of colonic polyps 10/03/2011    Juel Burrow, PT, DPT 08/31/2021, 11:14 AM  [facility] @ [place], [place], [address] Phone: [phone]   Fax:  [phone]  Name: Justin Hampton MRN: [id number] Date of Birth: 06-Oct-1946

## 2021-09-03 ENCOUNTER — Ambulatory Visit: Payer: Medicare Other | Admitting: Rehabilitative and Restorative Service Providers"

## 2021-09-03 ENCOUNTER — Other Ambulatory Visit: Payer: Self-pay

## 2021-09-03 ENCOUNTER — Encounter: Payer: Self-pay | Admitting: Rehabilitative and Restorative Service Providers"

## 2021-09-03 DIAGNOSIS — R293 Abnormal posture: Secondary | ICD-10-CM

## 2021-09-03 DIAGNOSIS — R262 Difficulty in walking, not elsewhere classified: Secondary | ICD-10-CM

## 2021-09-03 DIAGNOSIS — M436 Torticollis: Secondary | ICD-10-CM

## 2021-09-03 DIAGNOSIS — M542 Cervicalgia: Secondary | ICD-10-CM

## 2021-09-03 DIAGNOSIS — M6281 Muscle weakness (generalized): Secondary | ICD-10-CM

## 2021-09-03 NOTE — Therapy (Signed)
[facility] @ [address] [place], [place], [address] Phone: [phone]   Fax:  [phone]  Physical Therapy Treatment  Patient Details  Name: Justin [facility] MRN: [id number] Date of Birth: 23-May-1946 Referring Provider (PT): Eldridge Abrahams, NP   Encounter Date: 09/03/2021   PT End of Session - 09/03/21 1054     Visit Number 9    Date for PT Re-Evaluation 10/01/21    PT Start Time 0109    PT Stop Time 1055    PT Time Calculation (min) 40 min    Activity Tolerance Patient tolerated treatment well    Behavior During Therapy [facility] for tasks assessed/performed             Past Medical History:  Diagnosis Date   Adenomatous colon polyp    Arthritis    fingers   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   Cirrhosis ([place])    Diverticulosis    GERD (gastroesophageal reflux disease)    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   Hepatic encephalopathy 04/12/2012   Hepatitis C    treated   Hypoglycemia    Hypothyroidism     Past Surgical History:  Procedure Laterality Date   APPENDECTOMY     CARPAL TUNNEL RELEASE Bilateral    COLONOSCOPY     ESOPHAGOGASTRODUODENOSCOPY     x 2   ESOPHAGOGASTRODUODENOSCOPY (EGD) WITH PROPOFOL N/A 10/29/2018   Procedure: ESOPHAGOGASTRODUODENOSCOPY (EGD) WITH PROPOFOL;  Surgeon: Irving Copas., MD;  Location: [place];  Service: Gastroenterology;  Laterality: N/A;   EUS N/A 10/29/2018   Procedure: UPPER ENDOSCOPIC ULTRASOUND (EUS) RADIAL;  Surgeon: Irving Copas., MD;  Location: [place];  Service: Gastroenterology;  Laterality: N/A;    There were no vitals filed for this visit.   Subjective Assessment - 09/03/21 1054     Subjective Pt with no new complaints.    Currently in Pain? No/denies                               [facility] Adult PT Treatment/Exercise - 09/03/21 0001       Transfers   Five time sit to stand comments   15.5 sec with UE use required      Standardized Balance Assessment   Standardized Balance Assessment Timed Up and Go Test      Timed Up and Go Test   TUG Normal TUG    Normal TUG (seconds) 21.1   with SPC     Neck Exercises: Machines for Strengthening   Nustep L5 x6 min with PT supervision to update on progress.      Neck Exercises: Theraband   Shoulder Extension 20 reps;Red    Rows 20 reps;Red    Shoulder External Rotation 20 reps;Red    Horizontal ABduction 20 reps;Red      Neck Exercises: Seated   Neck Retraction 20 reps;3 secs    Neck Retraction Limitations into ball    Upper Extremity D2 Flexion;10 reps   2x10   Theraband Level (UE D2) Level 2 (Red)    Other Seated Exercise Cervical extension x20.      Neck Exercises: Stretches   Other Neck Stretches Ambulating down hallway looking up to search for post-it notes and reaching up to retreive x8  PT Short Term Goals - 08/27/21 1053       PT SHORT TERM GOAL #1   Title be independent in initial HEP    Status Achieved               PT Long Term Goals - 09/03/21 1101       PT LONG TERM GOAL #1   Title be independent in advanced HEP    Status Partially Met      PT LONG TERM GOAL #3   Title Pt will increase cervical AROM to [facility] to allow him to look up to the microwave and to be able to look behind him to drive.    Status Partially Met      PT LONG TERM GOAL #4   Title Pt will increase B shoulder and R hip strength to at least 4+/5 to allow him to perform functional activities in his home.    Status On-going      PT LONG TERM GOAL #5   Title Pt will decrease TUG to 14 sec or less with LRAD to decrease his risk of falling.    Status On-going                   Plan - 09/03/21 1058     Clinical Impression Statement Justin Hampton continues to progress towards goal related activities.  He continues to report that he can do more at home and is able to see himself in the  mirror. Pt states that he has been able to ambulate some at home without his [facility] Pt is progressing towards improved ability to perform theraband exercises and was encouraged during sesison today to take up more slack on band for more reistance during exercises.    PT Treatment/Interventions ADLs/Self Care Home Management;Cryotherapy;Electrical Stimulation;Iontophoresis 106m/ml Dexamethasone;Moist Heat;Traction;Ultrasound;Gait training;Stair training;Functional mobility training;Therapeutic activities;Therapeutic exercise;Balance training;Neuromuscular re-education;Patient/family education;Manual techniques;Passive range of motion;Dry needling;Taping;Vasopneumatic Device;Spinal Manipulations;Joint Manipulations    PT Next Visit Plan assess and progress HEP, strengthening, ROM, postural corrections    Consulted and Agree with Plan of Care Patient             Patient will benefit from skilled therapeutic intervention in order to improve the following deficits and impairments:  Decreased range of motion, Difficulty walking, Decreased endurance, Increased muscle spasms, Impaired UE functional use, Decreased activity tolerance, Pain, Decreased balance, Impaired flexibility, Improper body mechanics, Decreased strength, Postural dysfunction  Visit Diagnosis: Muscle weakness (generalized)  Cervicalgia  Neck stiffness  Difficulty in walking, not elsewhere classified  Abnormal posture     Problem List Patient Active Problem List   Diagnosis Date Noted   Hypotension 04/01/2021   Lactic acidosis 04/01/2021   Loose stools 04/01/2021   AKI (acute kidney injury) ([place] 04/01/2021   Syncope 04/01/2021   Near syncope 03/31/2021   Hypoglycemia 10/31/2018   Impaired functional mobility, balance, gait, and endurance 10/31/2018   Dilation of pancreatic duct 10/02/2018   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[place] 09/28/2018   History of encephalopathy  09/28/2018   Vascular dementia without behavioral disturbance (HCC)    History of hepatitis C    Hypothyroidism (acquired)    Gastroesophageal reflux disease    Aneurysm, cerebral, nonruptured    AMS (altered mental status) 08/17/2018   Gait disturbance 08/02/2018   Mild memory disturbance 08/02/2018   Hepatic encephalopathy 07/13/2018   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[place] 07/13/2018   CKD (chronic kidney  disease), stage III ([place]) 07/13/2018   Cervical stenosis of spine 05/29/2018   Degenerative disc disease, cervical 05/29/2018   Iron deficiency anemia due to chronic blood loss 09/12/2017   Rectal bleeding 06/06/2017   Acute renal failure (ARF) ([place]) 06/06/2017   Hyponatremia 06/06/2017   Hyperkalemia 06/06/2017   Anemia 06/06/2017   GI bleeding 06/06/2017   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[place])    Other dental procedure status 05/22/2017   Family history of CHF (congestive heart failure) January 26, 2017   Family history of sudden death January 26, 2017   Dyspnea 07-14-16   Other disorder of circulatory system 04/12/2012   Phlebolithiasis 04/12/2012   Abnormal prostate by palpation 04/11/2012   Benign neoplasm of colon 04/11/2012   Degenerative joint disease of pelvic region 04/11/2012   Disorder of prostate 04/11/2012   Iron excess 04/11/2012   Lumbosacral spondylosis without myelopathy 04/11/2012   Nocturia 04/11/2012   Orthostatic hypotension 04/11/2012   Osteoarthritis of both hips 04/11/2012   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   Left sided sciatica 04/11/2012   History of colonic polyps 10/03/2011    Juel Burrow, PT, DPT 09/03/2021, 11:03 AM  [facility] @ [place], [place], [address] Phone: [phone]   Fax:  [phone]  Name: Justin [facility] MRN: [id number] Date of Birth: 30-Nov-1945

## 2021-09-09 ENCOUNTER — Ambulatory Visit: Payer: Medicare Other | Admitting: Rehabilitative and Restorative Service Providers"

## 2021-09-09 ENCOUNTER — Encounter: Payer: Self-pay | Admitting: Rehabilitative and Restorative Service Providers"

## 2021-09-09 ENCOUNTER — Other Ambulatory Visit: Payer: Self-pay

## 2021-09-09 DIAGNOSIS — M542 Cervicalgia: Secondary | ICD-10-CM

## 2021-09-09 DIAGNOSIS — M436 Torticollis: Secondary | ICD-10-CM

## 2021-09-09 DIAGNOSIS — R262 Difficulty in walking, not elsewhere classified: Secondary | ICD-10-CM

## 2021-09-09 DIAGNOSIS — M6281 Muscle weakness (generalized): Secondary | ICD-10-CM

## 2021-09-09 NOTE — Therapy (Addendum)
[facility] @ [address] [place], [place], [address] Phone: [phone]   Fax:  [phone]  Physical Therapy Treatment  Patient Details  Name: Justin [facility] MRN: [id number] Date of Birth: [birth year] Referring Provider (PT): Eldridge Abrahams, NP   Encounter Date: 09/09/2021  Progress Note  See note below for Objective Data and Assessment of Progress/Goals.       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   Hepatic encephalopathy 04/12/2012   Hepatitis C    treated   Hypoglycemia    Hypothyroidism     Past Surgical History:  Procedure Laterality Date   APPENDECTOMY     CARPAL TUNNEL RELEASE Bilateral    COLONOSCOPY     ESOPHAGOGASTRODUODENOSCOPY     x 2   ESOPHAGOGASTRODUODENOSCOPY (EGD) WITH PROPOFOL N/A 10/29/2018   Procedure: ESOPHAGOGASTRODUODENOSCOPY (EGD) WITH PROPOFOL;  Surgeon: Irving Copas., MD;  Location: [place] [facility];  Service: Gastroenterology;  Laterality: N/A;   EUS N/A 10/29/2018   Procedure: UPPER ENDOSCOPIC ULTRASOUND (EUS) RADIAL;  Surgeon: Irving Copas., MD;  Location: [place];  Service: Gastroenterology;  Laterality: N/A;    There were no vitals filed for this visit.   Subjective Assessment - 09/09/21 1156     Subjective I can tell that I am making a lot of progress.    Patient Stated Goals Pt would like to be more upright with a straighter  back and to be able to look up to use microwave easily again.    Currently in Pain? Yes    Pain Score 1     Pain Location Neck    Pain Descriptors / Indicators Sore    Pain Type Chronic pain                [facility] PT Assessment - 09/09/21 0001       Assessment   Medical Diagnosis M54.2 (ICD-10-CM) - Cervicalgia  R53.1 (ICD-10-CM) - Weakness    Referring Provider (PT) Eldridge Abrahams, NP      Observation/Other Assessments   Focus on Therapeutic Outcomes (FOTO)  57%                           [facility] Adult PT Treatment/Exercise - 09/09/21 0001       Transfers   Five time sit to stand comments  14.2 sec with UE use      Standardized Balance Assessment   Standardized Balance Assessment Timed Up and Go Test      Timed Up and Go Test   TUG Normal TUG    Normal TUG (seconds) 19.6   without assistive device     Neck Exercises: Machines for Strengthening   Nustep L5 x6 min  with PT supervision to update on progress.      Neck Exercises: Theraband   Shoulder Extension 20 reps;Red    Rows 20 reps;Red    Shoulder External Rotation 20 reps;Red    Horizontal ABduction 20 reps;Red      Neck Exercises: Seated   Neck Retraction 20 reps;3 secs    Neck Retraction Limitations into ball    Cervical Rotation Both;20 reps    W Back 20 reps   red theraband   Other Seated Exercise Cervical extension x20.                       PT Short Term Goals - 08/27/21 1053       PT SHORT TERM GOAL #1   Title be independent in initial HEP    Status Achieved               PT Long Term Goals - 09/09/21 1248       PT LONG TERM GOAL #1   Title be independent in advanced HEP    Status Partially Met      PT LONG TERM GOAL #2   Title improve FOTO to at least 61%    Status Partially Met      PT LONG TERM GOAL #3   Title Pt will increase cervical AROM to [facility] to allow him to look up to the microwave and to be able to look behind him to drive.    Status Achieved    Pt reports that he has been able to use microwave at home.     PT LONG TERM GOAL #4   Title Pt will increase B shoulder and R hip strength to at least 4+/5 to allow him to perform functional activities in his home.    Status On-going      PT LONG TERM GOAL #5   Title Pt will decrease TUG to 14 sec or less with LRAD to decrease his risk of falling.    Status Partially Met                   Plan - 09/09/21 1244     Clinical Impression Statement Justin [place] is making progress towards goals and has improved his FOTO score.  Additionally, he has improved his time on 5 times sit to/from stand and TUG.  Pt was able to ambulate around inside clinic today without assistive device and complete TUG without assistive device today. Pt continues to require cuing during ther ex to remember to "look up" to improve posture.  Pt reports that he was able to get in his shower at home and that he is able to do more around the home than he could before starting PT.    PT Treatment/Interventions ADLs/Self Care Home Management;Cryotherapy;Electrical Stimulation;Iontophoresis 92m/ml Dexamethasone;Moist Heat;Traction;Ultrasound;Gait training;Stair training;Functional mobility training;Therapeutic activities;Therapeutic exercise;Balance training;Neuromuscular re-education;Patient/family education;Manual techniques;Passive range of motion;Dry needling;Taping;Vasopneumatic Device;Spinal Manipulations;Joint Manipulations    PT Next Visit Plan assess and progress HEP, strengthening, ROM, postural corrections    Consulted and Agree with Plan of Care Patient             Patient will benefit from skilled therapeutic intervention in order to improve the following deficits and impairments:  Decreased range of motion, Difficulty walking, Decreased endurance, Increased muscle spasms, Impaired UE functional use, Decreased activity tolerance, Pain, Decreased balance, Impaired flexibility, Improper body mechanics,  Decreased strength, Postural dysfunction  Visit Diagnosis: Muscle weakness (generalized)  Cervicalgia  Neck  stiffness  Difficulty in walking, not elsewhere classified     Problem List Patient Active Problem List   Diagnosis Date Noted   Hypotension 04/01/2021   Lactic acidosis 04/01/2021   Loose stools 04/01/2021   AKI (acute kidney injury) ([place]) 04/01/2021   Syncope 04/01/2021   Near syncope 03/31/2021   Hypoglycemia 10/31/2018   Impaired functional mobility, balance, gait, and endurance 10/31/2018   Dilation of pancreatic duct 10/02/2018   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[place]) 09/28/2018   History of encephalopathy 09/28/2018   Vascular dementia without behavioral disturbance ([place])    History of hepatitis C    Hypothyroidism (acquired)    Gastroesophageal reflux disease    Aneurysm, cerebral, nonruptured    AMS (altered mental status) 08/17/2018   Gait disturbance 08/02/2018   Mild memory disturbance 08/02/2018   Hepatic encephalopathy 07/13/2018   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[place]) 07/13/2018   CKD (chronic kidney disease), stage III ([place]) 07/13/2018   Cervical stenosis of spine 05/29/2018   Degenerative disc disease, cervical 05/29/2018   Iron deficiency anemia due to chronic blood loss 09/12/2017   Rectal bleeding 06/06/2017   Acute renal failure (ARF) ([place]) 06/06/2017   Hyponatremia 06/06/2017   Hyperkalemia 06/06/2017   Anemia 06/06/2017   GI bleeding 06/06/2017   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[place])    Other dental procedure status 05/22/2017   Family history of CHF (congestive heart failure) 02/03/2017   Family history of sudden death 2017-02-03   Dyspnea 07/22/2016   Other disorder of circulatory system 04/12/2012   Phlebolithiasis 04/12/2012   Abnormal prostate by palpation 04/11/2012   Benign neoplasm of colon 04/11/2012   Degenerative joint disease of pelvic region 04/11/2012   Disorder of prostate  04/11/2012   Iron excess 04/11/2012   Lumbosacral spondylosis without myelopathy 04/11/2012   Nocturia 04/11/2012   Orthostatic hypotension 04/11/2012   Osteoarthritis of both hips 04/11/2012   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   Left sided sciatica 04/11/2012   History of colonic polyps 10/03/2011   PHYSICAL THERAPY DISCHARGE SUMMARY  Pt missed last visit due to being sick.  Called patient on 09/30/21 and he stated that he had not been feeling well and does hope to come back to PT later when he is feeling better.  Notified pt that his plan of care was expiring and he would be discharged at this time, but to please have his MD send a new order for PT services when he is ready to participate again.  Patient agrees to discharge. Patient goals were partially met. Patient is being discharged due to not returning since the last visit.   Juel Burrow, PT, DPT 09/09/2021, 12:49 PM  [facility] @ [address] [place], [place], [address] Phone: [phone]   Fax:  [phone]  Name: Justin [place] MRN: [id number] Date of Birth: Jun 09, 1946

## 2021-09-14 ENCOUNTER — Encounter: Payer: Medicare Other | Admitting: Rehabilitative and Restorative Service Providers"

## 2021-09-21 ENCOUNTER — Encounter: Payer: Medicare Other | Admitting: Rehabilitative and Restorative Service Providers"

## 2021-09-23 ENCOUNTER — Encounter: Payer: Medicare Other | Admitting: Rehabilitative and Restorative Service Providers"

## 2021-12-22 ENCOUNTER — Encounter: Payer: Self-pay | Admitting: Rehabilitative and Restorative Service Providers"

## 2021-12-22 ENCOUNTER — Ambulatory Visit: Payer: Medicare Other | Attending: Nurse Practitioner | Admitting: Rehabilitative and Restorative Service Providers"

## 2021-12-22 ENCOUNTER — Other Ambulatory Visit: Payer: Self-pay

## 2021-12-22 DIAGNOSIS — M436 Torticollis: Secondary | ICD-10-CM | POA: Insufficient documentation

## 2021-12-22 DIAGNOSIS — M6281 Muscle weakness (generalized): Secondary | ICD-10-CM | POA: Diagnosis present

## 2021-12-22 DIAGNOSIS — M542 Cervicalgia: Secondary | ICD-10-CM | POA: Diagnosis present

## 2021-12-22 DIAGNOSIS — R262 Difficulty in walking, not elsewhere classified: Secondary | ICD-10-CM | POA: Insufficient documentation

## 2021-12-22 DIAGNOSIS — R293 Abnormal posture: Secondary | ICD-10-CM | POA: Diagnosis present

## 2021-12-22 NOTE — Therapy (Signed)
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   Cirrhosis (HCC)    Diverticulosis    GERD (gastroesophageal reflux disease)    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   Hepatic encephalopathy 04/12/2012   Hepatitis C    treated   Hypoglycemia    Hypothyroidism    Past Surgical History:  Procedure Laterality Date   APPENDECTOMY     CARPAL TUNNEL RELEASE Bilateral    COLONOSCOPY     ESOPHAGOGASTRODUODENOSCOPY     x 2   ESOPHAGOGASTRODUODENOSCOPY (EGD) WITH PROPOFOL N/A 10/29/2018   Procedure: ESOPHAGOGASTRODUODENOSCOPY (EGD) WITH PROPOFOL;  Surgeon: Irving Copas., MD;  Location: [facility];  Service: Gastroenterology;  Laterality: N/A;   EUS N/A 10/29/2018   Procedure: UPPER ENDOSCOPIC ULTRASOUND (EUS) RADIAL;  Surgeon: Irving Copas., MD;  Location: [facility];  Service: Gastroenterology;  Laterality: N/A;   Patient Active Problem List   Diagnosis Date Noted   Hypotension 04/01/2021   Lactic acidosis 04/01/2021   Loose stools 04/01/2021   AKI (acute kidney injury) ([place]) 04/01/2021   Syncope 04/01/2021   Near syncope 03/31/2021   Hypoglycemia 10/31/2018   Impaired functional mobility, balance, gait, and endurance 10/31/2018   Dilation of pancreatic duct 10/02/2018   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[place]) 09/28/2018   History  of encephalopathy 09/28/2018   Vascular dementia without behavioral disturbance ([place])    History of hepatitis C    Hypothyroidism (acquired)    Gastroesophageal reflux disease    Aneurysm, cerebral, nonruptured    AMS (altered mental status) 08/17/2018   Gait disturbance 08/02/2018   Mild memory disturbance 08/02/2018   Hepatic encephalopathy 07/13/2018   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[place]) 07/13/2018   CKD (chronic kidney disease), stage III ([place]) 07/13/2018   Cervical stenosis of spine 05/29/2018   Degenerative disc disease, cervical 05/29/2018   Iron deficiency anemia due to chronic blood loss 09/12/2017   Rectal bleeding 06/06/2017   Acute renal failure (ARF) ([place]) 06/06/2017   Hyponatremia 06/06/2017   Hyperkalemia 06/06/2017   Anemia 06/06/2017   GI bleeding 06/06/2017   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[place])    Other dental procedure status 05/22/2017   Family history of CHF (congestive heart failure) 02-19-17   Family history of sudden death Feb 19, 2017   Dyspnea Aug 07, 2016   Other disorder of circulatory system 04/12/2012   Phlebolithiasis 04/12/2012   Abnormal prostate by palpation 04/11/2012   Benign neoplasm of colon 04/11/2012   Degenerative joint disease of pelvic region 04/11/2012   Disorder of prostate 04/11/2012   Iron excess 04/11/2012   Lumbosacral spondylosis without myelopathy 04/11/2012   Nocturia 04/11/2012   Orthostatic hypotension 04/11/2012   Osteoarthritis of both hips 04/11/2012  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   Left sided sciatica 04/11/2012   History of colonic polyps 10/03/2011    PCP: Bartholome Bill, MD  REFERRING PROVIDER: Berkley Harvey, NP  REFERRING DIAG: Cervicalgia  THERAPY DIAG:  Cervicalgia  Neck stiffness  Muscle weakness (generalized)  Abnormal posture  ONSET DATE: 12/09/2021  SUBJECTIVE:                                                                                                                                                                                                          SUBJECTIVE STATEMENT: Pt reports that he has been doing better and had reached out to Eldridge Abrahams, NP office to ask for additional PT services for his neck.  He had stopped doing PT previously, as he had gotten sick and wanted to hold services, but he is now ready to participate again.  PERTINENT HISTORY:  OA, Cirrhosis, GERD, Hemophilia, Hepatitis C, Hypothyroidism  PAIN:  Are you having pain? Yes NPRS scale: 4/10 Pain location: cervical Pain orientation: Medial and Lower  PAIN TYPE: aching and tight Pain description: intermittent  Aggravating factors: certain motions Relieving factors: rest  PRECAUTIONS: Fall  WEIGHT BEARING RESTRICTIONS No  FALLS:  Has patient fallen in last 6 months? No Number of falls: 0  LIVING ENVIRONMENT: Lives with: lives alone Lives in: House/apartment Stairs: Yes; Internal: 14 steps; can reach both and External: 4 steps; can reach both Has following equipment at home: Single point cane, Walker - 2 wheeled, and Grab bars  OCCUPATION: retired  PLOF: Independent  PATIENT GOALS:  I want to be able to get around my house and drive my car without help.  OBJECTIVE:   DIAGNOSTIC FINDINGS:  No new imagine  PATIENT SURVEYS:  FOTO 46% (59% projected by visit 11)   COGNITION: Overall cognitive status: Within functional limits for tasks assessed  POSTURE:  Forward head, kyphotic thoracic spine  CERVICAL AROM/PROM  A/PROM A/PROM (deg) 12/22/2021  Flexion 45  Extension 10  Right lateral flexion 12  Left lateral flexion 10  Right rotation 40  Left rotation 40   (Blank rows = not tested)  UE MMT: Bilateral UE grossly 4/5 throughout  FUNCTIONAL TESTS:  5 times sit to stand: 15.9 sec with UE use Timed up and go (TUG): 16 sec without assistive device    TODAY'S TREATMENT:   12/22/2021: Evaluation Only   PATIENT EDUCATION:  Education details: Pt educated in  posture. Person educated: Patient Education method: Explanation Education comprehension: verbalized understanding   HOME EXERCISE PROGRAM: Will progress  ASSESSMENT:  CLINICAL IMPRESSION: Patient is a 76 y.o. male who was seen today for physical therapy evaluation and treatment for cervicalgia. Pt is known to this PT from a previous session.  He states that he has been performing his HEP, but he had to stop at the end of last year/early this year due to illness and he is now ready to return to therapy.  Pt states that he has resumed his HEP from before, but feels that he does not has as much motion in his neck as he did previously.  Pt has a paid caregiver who comes by for 3 hours and his sisters live nearby to assist if needed. Pt would like to return to his ability to perform household duties and driving independently to allow him to no longer need the paid caregiver.   OBJECTIVE IMPAIRMENTS decreased balance, decreased ROM, decreased strength, impaired UE functional use, improper body mechanics, postural dysfunction, and pain.   ACTIVITY LIMITATIONS cleaning, community activity, driving, and meal prep.   PERSONAL FACTORS Age and 3+ comorbidities: OA, GERD, Cirrhosis  are also affecting patient's functional outcome.    REHAB POTENTIAL: Good  CLINICAL DECISION MAKING: Evolving/moderate complexity  EVALUATION COMPLEXITY: Moderate   GOALS: Goals reviewed with patient? Yes  SHORT TERM GOALS:  STG Name Target Date Goal status  1 Pt will be independent with initial HEP. Baseline:  01/12/2022 INITIAL  2 Pt to report a 40% improvement in symptoms since starting PT. Baseline:  01/19/2022 INITIAL   LONG TERM GOALS:   LTG Name Target Date Goal status  1 Pt to be independent with advanced HEP. Baseline: 02/16/2022 INITIAL  2 Pt to increase FOTO to 59% to demonstrate his increased ability to perform activities in his home. Baseline: 02/16/2022 INITIAL  3 Pt to increase BUE strength to  at least 4+/5 to allow him to lift items in home. Baseline: 02/16/2022 INITIAL  4 Pt to increase cervical A/ROM by at least 10 degrees to allow him the ability to perform overhead tasks. Baseline: 02/16/2022 INITIAL   PLAN: PT FREQUENCY: 2x/week  PT DURATION: 8 weeks  PLANNED INTERVENTIONS: Therapeutic exercises, Therapeutic activity, Neuromuscular re-education, Balance training, Gait training, Patient/Family education, Joint manipulation, Joint mobilization, Aquatic Therapy, Dry Needling, Electrical stimulation, Spinal manipulation, Spinal mobilization, Cryotherapy, Moist heat, Taping, Traction, Ultrasound, Ionotophoresis 4mg /ml Dexamethasone, and Manual therapy  PLAN FOR NEXT SESSION: provide HEP, strengthening, flexibility   Juel Burrow, PT 12/22/2021, 12:02 PM  [facility] [address] [place], [place] [address] Phone # [phone] Fax [phone]

## 2021-12-24 ENCOUNTER — Ambulatory Visit: Payer: Medicare Other | Admitting: Rehabilitative and Restorative Service Providers"

## 2021-12-24 ENCOUNTER — Other Ambulatory Visit: Payer: Self-pay

## 2021-12-24 ENCOUNTER — Encounter: Payer: Self-pay | Admitting: Rehabilitative and Restorative Service Providers"

## 2021-12-24 DIAGNOSIS — M542 Cervicalgia: Secondary | ICD-10-CM

## 2021-12-24 DIAGNOSIS — M436 Torticollis: Secondary | ICD-10-CM

## 2021-12-24 DIAGNOSIS — M6281 Muscle weakness (generalized): Secondary | ICD-10-CM

## 2021-12-24 DIAGNOSIS — R293 Abnormal posture: Secondary | ICD-10-CM

## 2021-12-24 NOTE — Therapy (Signed)
OUTPATIENT PHYSICAL THERAPY TREATMENT NOTE   Patient Name: Orlanda Lemmerman MRN: [id number] DOB:1946-06-28, 76 y.o., male Today's Date: 12/24/2021  PCP: Bartholome Bill, MD REFERRING PROVIDER: Eldridge Abrahams, NP   PT End of Session - 12/24/21 [phone]     Visit Number 2    Date for PT Re-Evaluation 02/11/22    Authorization Type UHC    PT Start Time 0845    PT Stop Time 0925    PT Time Calculation (min) 40 min    Activity Tolerance Patient tolerated treatment well    Behavior During Therapy [facility] for tasks assessed/performed             Past Medical History:  Diagnosis Date   Adenomatous colon polyp    Arthritis    fingers   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   Cirrhosis ([place])    Diverticulosis    GERD (gastroesophageal reflux disease)    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   Hepatic encephalopathy 04/12/2012   Hepatitis C    treated   Hypoglycemia    Hypothyroidism    Past Surgical History:  Procedure Laterality Date   APPENDECTOMY     CARPAL TUNNEL RELEASE Bilateral    COLONOSCOPY     ESOPHAGOGASTRODUODENOSCOPY     x 2   ESOPHAGOGASTRODUODENOSCOPY (EGD) WITH PROPOFOL N/A 10/29/2018   Procedure: ESOPHAGOGASTRODUODENOSCOPY (EGD) WITH PROPOFOL;  Surgeon: Irving Copas., MD;  Location: [facility];  Service: Gastroenterology;  Laterality: N/A;   EUS N/A 10/29/2018   Procedure: UPPER ENDOSCOPIC ULTRASOUND (EUS) RADIAL;  Surgeon: Irving Copas., MD;  Location: [facility];  Service: Gastroenterology;  Laterality: N/A;   Patient Active Problem List   Diagnosis Date Noted   Hypotension 04/01/2021   Lactic acidosis 04/01/2021   Loose stools 04/01/2021   AKI (acute kidney injury) (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[place]) 09/28/2018   History of encephalopathy 09/28/2018   Vascular dementia without behavioral disturbance ([place])    History of hepatitis C    Hypothyroidism (acquired)    Gastroesophageal reflux disease    Aneurysm, cerebral, nonruptured    AMS (altered mental status) 08/17/2018   Gait disturbance 08/02/2018   Mild memory disturbance 08/02/2018   Hepatic encephalopathy 07/13/2018   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[place]) 07/13/2018   CKD (chronic kidney disease), stage III ([place]) 07/13/2018   Cervical stenosis of spine 05/29/2018   Degenerative disc disease, cervical 05/29/2018   Iron deficiency anemia due to chronic blood loss 09/12/2017   Rectal bleeding 06/06/2017   Acute renal failure (ARF) ([place]) 06/06/2017   Hyponatremia 06/06/2017   Hyperkalemia 06/06/2017   Anemia 06/06/2017   GI bleeding 06/06/2017   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[place])    Other dental procedure status 05/22/2017   Family history of CHF (congestive heart failure) Feb 19, 2017   Family history of sudden death 02/19/17   Dyspnea 2016-08-07   Other disorder of circulatory system 04/12/2012   Phlebolithiasis 04/12/2012   Abnormal prostate by palpation 04/11/2012   Benign neoplasm of colon 04/11/2012   Degenerative joint disease of pelvic region 04/11/2012   Disorder of prostate 04/11/2012   Iron excess 04/11/2012   Lumbosacral spondylosis without myelopathy 04/11/2012   Nocturia 04/11/2012  Orthostatic hypotension 04/11/2012   Osteoarthritis of both hips 04/11/2012   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   Left sided sciatica 04/11/2012   History of colonic polyps 10/03/2011    REFERRING DIAG: Cervicalgia  THERAPY DIAG:  Cervicalgia  Neck stiffness  Muscle weakness (generalized)  Abnormal posture  PERTINENT HISTORY: OA, Cirrhosis, GERD, Hemophilia, Hepatitis C, Hypothyroidism  PRECAUTIONS: Fall  SUBJECTIVE: Pt reports that he has been doing his HEP from last  time  PAIN:  Are you having pain? Yes NPRS scale: 4/10 Pain location: cervical Pain orientation: Lower  PAIN TYPE: aching Pain description: intermittent  Aggravating factors: during cervical rotation Relieving factors: rest   OBJECTIVE:    DIAGNOSTIC FINDINGS:  No new imagine   PATIENT SURVEYS:  FOTO 46% (59% projected by visit 11)     COGNITION: Overall cognitive status: Within functional limits for tasks assessed   POSTURE:  Forward head, kyphotic thoracic spine   CERVICAL AROM/PROM   A/PROM A/PROM (deg) 12/22/2021  Flexion 45  Extension 10  Right lateral flexion 12  Left lateral flexion 10  Right rotation 40  Left rotation 40   (Blank rows = not tested)   UE MMT: Bilateral UE grossly 4/5 throughout   FUNCTIONAL TESTS:  5 times sit to stand: 15.9 sec with UE use Timed up and go (TUG): 16 sec without assistive device       TODAY'S TREATMENT:    12/24/2021: Nustep L4 x6 min with PT present to discuss status Seated:  cervical retraction into ball, posterior shoulder rolls, scapular retraction 2x10 each Cervical A/ROM flexion/extension, cervical rotation, and overhead reach 2x10 each Seated with yellow tband:  Shoulder flexion, shoulder scaption, shoulder ER and horizontal abduction 2x10 each Thoracic extension with ball behind back 2x10 Wall push ups 2x10  12/22/2021: Evaluation Only     PATIENT EDUCATION:  Education details: Pt educated in posture. Person educated: Patient Education method: Explanation Education comprehension: verbalized understanding     HOME EXERCISE PROGRAM: Access Code: [email or web address] URL: [email or web address] Date: 12/24/2021 Prepared by: Shelby Dubin Benzion Mesta  Exercises Seated Scapular Retraction - 2 x daily - 7 x weekly - 2 sets - 10 reps Seated Cervical Rotation AROM - 2 x daily - 7 x weekly - 2 sets - 10 reps Seated Overhead Reach Stretch - 2 x daily - 7 x weekly - 2 sets - 10 reps Seated Cervical Extension AROM - 2 x  daily - 7 x weekly - 2 sets - 10 reps Shoulder External Rotation and Scapular Retraction with Resistance - 1-2 x daily - 7 x weekly - 2 sets - 10 reps Standing Shoulder Horizontal Abduction with Resistance - 1-2 x daily - 7 x weekly - 2 sets - 10 reps Standing Shoulder Single Arm PNF D2 Flexion with Resistance - 1-2 x daily - 7 x weekly - 1 sets - 10 reps Supine Cervical Retraction with Towel - 1-2 x daily - 7 x weekly - 2 sets - 10 reps - 2 sec hold    ASSESSMENT:   CLINICAL IMPRESSION: Mr Selvey tolerated session well and without complaints of increased pain.  Pt requires cuing throughout session for improved posture to decrease forward flexion.  Pt requires minimal seated recovery periods throughout session secondary to fatigue. Pt continues to require skilled PT to progress towards goal related activities.     OBJECTIVE IMPAIRMENTS decreased balance, decreased ROM, decreased strength, impaired UE functional use, improper body mechanics, postural dysfunction, and pain.    ACTIVITY LIMITATIONS cleaning, community activity,  driving, and meal prep.    PERSONAL FACTORS Age and 3+ comorbidities: OA, GERD, Cirrhosis  are also affecting patient's functional outcome.      REHAB POTENTIAL: Good   CLINICAL DECISION MAKING: Evolving/moderate complexity   EVALUATION COMPLEXITY: Moderate     GOALS: Goals reviewed with patient? Yes   SHORT TERM GOALS:   STG Name Target Date Goal status  1 Pt will be independent with initial HEP. Baseline:  01/12/2022 Ongoing  2 Pt to report a 40% improvement in symptoms since starting PT. Baseline:  01/19/2022 INITIAL    LONG TERM GOALS:    LTG Name Target Date Goal status  1 Pt to be independent with advanced HEP. Baseline: 02/16/2022 INITIAL  2 Pt to increase FOTO to 59% to demonstrate his increased ability to perform activities in his home. Baseline: 02/16/2022 INITIAL  3 Pt to increase BUE strength to at least 4+/5 to allow him to lift items in  home. Baseline: 02/16/2022 INITIAL  4 Pt to increase cervical A/ROM by at least 10 degrees to allow him the ability to perform overhead tasks. Baseline: 02/16/2022 INITIAL    PLAN: PT FREQUENCY: 2x/week   PT DURATION: 8 weeks   PLANNED INTERVENTIONS: Therapeutic exercises, Therapeutic activity, Neuromuscular re-education, Balance training, Gait training, Patient/Family education, Joint manipulation, Joint mobilization, Aquatic Therapy, Dry Needling, Electrical stimulation, Spinal manipulation, Spinal mobilization, Cryotherapy, Moist heat, Taping, Traction, Ultrasound, Ionotophoresis 4mg /ml Dexamethasone, and Manual therapy   PLAN FOR NEXT SESSION: provide HEP, strengthening, flexibility    Brycin Kille, PT 12/24/2021, 9:31 AM

## 2021-12-29 ENCOUNTER — Ambulatory Visit: Payer: Medicare Other | Admitting: Rehabilitative and Restorative Service Providers"

## 2021-12-29 ENCOUNTER — Encounter: Payer: Self-pay | Admitting: Rehabilitative and Restorative Service Providers"

## 2021-12-29 ENCOUNTER — Other Ambulatory Visit: Payer: Self-pay

## 2021-12-29 DIAGNOSIS — M6281 Muscle weakness (generalized): Secondary | ICD-10-CM

## 2021-12-29 DIAGNOSIS — M542 Cervicalgia: Secondary | ICD-10-CM

## 2021-12-29 DIAGNOSIS — M436 Torticollis: Secondary | ICD-10-CM

## 2021-12-29 DIAGNOSIS — R293 Abnormal posture: Secondary | ICD-10-CM

## 2021-12-29 NOTE — Therapy (Signed)
OUTPATIENT PHYSICAL THERAPY TREATMENT NOTE   Patient Name: Justin Hampton MRN: [id number] DOB:01-09-1946, 76 y.o., male Today's Date: 12/29/2021  PCP: Bartholome Bill, MD REFERRING PROVIDER: Eldridge Abrahams, NP   PT End of Session - 12/29/21 1150     Visit Number 3    Date for PT Re-Evaluation 02/11/22    Authorization Type UHC    PT Start Time 1143    PT Stop Time 1225    PT Time Calculation (min) 42 min    Activity Tolerance Patient tolerated treatment well    Behavior During Therapy WFL for tasks assessed/performed             Past Medical History:  Diagnosis Date   Adenomatous colon polyp    Arthritis    fingers   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   Cirrhosis ([place])    Diverticulosis    GERD (gastroesophageal reflux disease)    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   Hepatic encephalopathy 04/12/2012   Hepatitis C    treated   Hypoglycemia    Hypothyroidism    Past Surgical History:  Procedure Laterality Date   APPENDECTOMY     CARPAL TUNNEL RELEASE Bilateral    COLONOSCOPY     ESOPHAGOGASTRODUODENOSCOPY     x 2   ESOPHAGOGASTRODUODENOSCOPY (EGD) WITH PROPOFOL N/A 10/29/2018   Procedure: ESOPHAGOGASTRODUODENOSCOPY (EGD) WITH PROPOFOL;  Surgeon: Irving Copas., MD;  Location: [facility];  Service: Gastroenterology;  Laterality: N/A;   EUS N/A 10/29/2018   Procedure: UPPER ENDOSCOPIC ULTRASOUND (EUS) RADIAL;  Surgeon: Irving Copas., MD;  Location: [facility];  Service: Gastroenterology;  Laterality: N/A;   Patient Active Problem List   Diagnosis Date Noted   Hypotension 04/01/2021   Lactic acidosis 04/01/2021   Loose stools 04/01/2021   AKI (acute kidney injury) ([place]) 04/01/2021   Syncope 04/01/2021   Near syncope 03/31/2021   Hypoglycemia 10/31/2018   Impaired functional mobility, balance, gait, and endurance 10/31/2018   Dilation of pancreatic duct 10/02/2018   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   History of encephalopathy 09/28/2018   Vascular dementia without behavioral disturbance ([place])    History of hepatitis C    Hypothyroidism (acquired)    Gastroesophageal reflux disease    Aneurysm, cerebral, nonruptured    AMS (altered mental status) 08/17/2018   Gait disturbance 08/02/2018   Mild memory disturbance 08/02/2018   Hepatic encephalopathy 07/13/2018   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[place]) 07/13/2018   CKD (chronic kidney disease), stage III ([place]) 07/13/2018   Cervical stenosis of spine 05/29/2018   Degenerative disc disease, cervical 05/29/2018   Iron deficiency anemia due to chronic blood loss 09/12/2017   Rectal bleeding 06/06/2017   Acute renal failure (ARF) ([place]) 06/06/2017   Hyponatremia 06/06/2017   Hyperkalemia 06/06/2017   Anemia 06/06/2017   GI bleeding 06/06/2017   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[place])    Other dental procedure status 05/22/2017   Family history of CHF (congestive heart failure) Feb 17, 2017   Family history of sudden death Feb 17, 2017   Dyspnea 08/05/2016   Other disorder of circulatory system 04/12/2012   Phlebolithiasis 04/12/2012   Abnormal prostate by palpation 04/11/2012   Benign neoplasm of colon 04/11/2012   Degenerative joint disease of pelvic region 04/11/2012   Disorder of prostate 04/11/2012   Iron excess 04/11/2012   Lumbosacral spondylosis without myelopathy 04/11/2012   Nocturia 04/11/2012  Orthostatic hypotension 04/11/2012   Osteoarthritis of both hips 04/11/2012   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   Left sided sciatica 04/11/2012   History of colonic polyps 10/03/2011    REFERRING DIAG: Cervicalgia  THERAPY DIAG:  Cervicalgia  Neck stiffness  Muscle weakness (generalized)  Abnormal posture  PERTINENT HISTORY: OA, Cirrhosis, GERD, Hemophilia, Hepatitis C, Hypothyroidism  PRECAUTIONS: Fall  SUBJECTIVE: Pt reports that he has been doing his HEP from last  time  PAIN:  Are you having pain? Yes NPRS scale: 4/10 Pain location: cervical Pain orientation: Lower  PAIN TYPE: aching Pain description: intermittent  Aggravating factors: during cervical rotation Relieving factors: rest   OBJECTIVE:    DIAGNOSTIC FINDINGS:  No new imagine   PATIENT SURVEYS:  FOTO 46% (59% projected by visit 11)     COGNITION: Overall cognitive status: Within functional limits for tasks assessed   POSTURE:  Forward head, kyphotic thoracic spine   CERVICAL AROM/PROM   A/PROM A/PROM (deg) 12/22/2021  Flexion 45  Extension 10  Right lateral flexion 12  Left lateral flexion 10  Right rotation 40  Left rotation 40   (Blank rows = not tested)   UE MMT: Bilateral UE grossly 4/5 throughout   FUNCTIONAL TESTS:  5 times sit to stand: 15.9 sec with UE use Timed up and go (TUG): 16 sec without assistive device       TODAY'S TREATMENT:    12/29/2021: Nustep L5 x6 min with PT present to discuss status Seated:  cervical retraction into ball, thoracic extension with ball behind back 2x10 each Cervical A/ROM flexion/extension, cervical rotation, and overhead reach 2x10 each Seated:  shoulder flexion, shoulder ER, horizontal abduction, abduction with yellow tband.  2x10 each bilat. Standing:  rows and shoulder extension with yellow tband 2x10 each bilat. Wall push ups 2x10  12/24/2021: Nustep L4 x6 min with PT present to discuss status Seated:  cervical retraction into ball, posterior shoulder rolls, scapular retraction 2x10 each Cervical A/ROM flexion/extension, cervical rotation, and overhead reach 2x10 each Seated with yellow tband:  Shoulder flexion, shoulder scaption, shoulder ER and horizontal abduction 2x10 each Thoracic extension with ball behind back 2x10 Wall push ups 2x10  12/22/2021: Evaluation Only     PATIENT EDUCATION:  Education details: Pt educated in posture. Person educated: Patient Education method: Explanation Education  comprehension: verbalized understanding     HOME EXERCISE PROGRAM: Access Code: [email or web address] URL: [email or web address] Date: 12/29/2021 Prepared by: Shelby Dubin Breckin [place]  Exercises Supine Cervical Retraction with Towel - 1-2 x daily - 7 x weekly - 2 sets - 10 reps - 2 sec hold Seated Scapular Retraction - 2 x daily - 7 x weekly - 2 sets - 10 reps Seated Cervical Rotation AROM - 2 x daily - 7 x weekly - 2 sets - 10 reps Seated Cervical Extension AROM - 2 x daily - 7 x weekly - 2 sets - 10 reps Seated Overhead Reach Stretch - 2 x daily - 7 x weekly - 2 sets - 10 reps Shoulder External Rotation and Scapular Retraction with Resistance - 1-2 x daily - 7 x weekly - 2 sets - 10 reps Standing Shoulder Horizontal Abduction with Resistance - 1-2 x daily - 7 x weekly - 2 sets - 10 reps Standing Shoulder Single Arm PNF D2 Flexion with Resistance - 1-2 x daily - 7 x weekly - 1 sets - 10 reps Seated Thoracic Extension with Hands Behind Neck - 1 x daily - 7 x weekly - 2 sets -  10 reps Standing Shoulder Flexion with Resistance - 1 x daily - 7 x weekly - 2 sets - 10 reps Standing Single Arm Shoulder Abduction with Resistance - 1 x daily - 7 x weekly - 2 sets - 10 reps Shoulder extension with resistance - Neutral - 1 x daily - 7 x weekly - 2 sets - 10 reps Standing Shoulder Row with Anchored Resistance - 1 x daily - 7 x weekly - 2 sets - 10 reps    ASSESSMENT:   CLINICAL IMPRESSION: Mr Rabenold tolerated session well and without complaints of increased pain.  Able to progress HEP with pt able to properly demonstrate during PT session.  Pt requires cuing throughout for improved posture when sitting and performing therex.  Pt continues to require skilled PT to progress towards goal related activities.     OBJECTIVE IMPAIRMENTS decreased balance, decreased ROM, decreased strength, impaired UE functional use, improper body mechanics, postural dysfunction, and pain.    ACTIVITY LIMITATIONS cleaning,  community activity, driving, and meal prep.    PERSONAL FACTORS Age and 3+ comorbidities: OA, GERD, Cirrhosis  are also affecting patient's functional outcome.      REHAB POTENTIAL: Good   CLINICAL DECISION MAKING: Evolving/moderate complexity   EVALUATION COMPLEXITY: Moderate     GOALS: Goals reviewed with patient? Yes   SHORT TERM GOALS:   STG Name Target Date Goal status  1 Pt will be independent with initial HEP. Baseline:  01/12/2022 Ongoing  2 Pt to report a 40% improvement in symptoms since starting PT. Baseline:  01/19/2022 Ongoing    LONG TERM GOALS:    LTG Name Target Date Goal status  1 Pt to be independent with advanced HEP. Baseline: 02/16/2022 INITIAL  2 Pt to increase FOTO to 59% to demonstrate his increased ability to perform activities in his home. Baseline: 02/16/2022 INITIAL  3 Pt to increase BUE strength to at least 4+/5 to allow him to lift items in home. Baseline: 02/16/2022 INITIAL  4 Pt to increase cervical A/ROM by at least 10 degrees to allow him the ability to perform overhead tasks. Baseline: 02/16/2022 INITIAL    PLAN: PT FREQUENCY: 2x/week   PT DURATION: 8 weeks   PLANNED INTERVENTIONS: Therapeutic exercises, Therapeutic activity, Neuromuscular re-education, Balance training, Gait training, Patient/Family education, Joint manipulation, Joint mobilization, Aquatic Therapy, Dry Needling, Electrical stimulation, Spinal manipulation, Spinal mobilization, Cryotherapy, Moist heat, Taping, Traction, Ultrasound, Ionotophoresis '4mg'$ /ml Dexamethasone, and Manual therapy   PLAN FOR NEXT SESSION: provide HEP, strengthening, flexibility    Gaspar Fowle, PT 12/29/2021, 12:30 PM

## 2021-12-31 ENCOUNTER — Ambulatory Visit: Payer: Medicare Other | Admitting: Physical Therapy

## 2021-12-31 ENCOUNTER — Encounter: Payer: Self-pay | Admitting: Physical Therapy

## 2021-12-31 ENCOUNTER — Other Ambulatory Visit: Payer: Self-pay

## 2021-12-31 DIAGNOSIS — M436 Torticollis: Secondary | ICD-10-CM

## 2021-12-31 DIAGNOSIS — M542 Cervicalgia: Secondary | ICD-10-CM

## 2021-12-31 DIAGNOSIS — M6281 Muscle weakness (generalized): Secondary | ICD-10-CM

## 2021-12-31 DIAGNOSIS — R293 Abnormal posture: Secondary | ICD-10-CM

## 2021-12-31 DIAGNOSIS — R262 Difficulty in walking, not elsewhere classified: Secondary | ICD-10-CM

## 2021-12-31 NOTE — Therapy (Signed)
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   Cirrhosis ([place])    Diverticulosis    GERD (gastroesophageal reflux disease)    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   Hepatic encephalopathy 04/12/2012   Hepatitis C    treated   Hypoglycemia    Hypothyroidism    Past Surgical History:  Procedure Laterality Date   APPENDECTOMY     CARPAL TUNNEL RELEASE Bilateral    COLONOSCOPY     ESOPHAGOGASTRODUODENOSCOPY     x 2   ESOPHAGOGASTRODUODENOSCOPY (EGD) WITH PROPOFOL N/A 10/29/2018   Procedure: ESOPHAGOGASTRODUODENOSCOPY (EGD) WITH PROPOFOL;  Surgeon: Irving Copas., MD;  Location: [facility];  Service: Gastroenterology;  Laterality: N/A;   EUS N/A 10/29/2018   Procedure: UPPER ENDOSCOPIC ULTRASOUND (EUS) RADIAL;  Surgeon: Irving Copas., MD;  Location: [facility];  Service: Gastroenterology;  Laterality: N/A;   Patient Active Problem List   Diagnosis Date Noted   Hypotension 04/01/2021   Lactic acidosis 04/01/2021   Loose stools 04/01/2021   AKI (acute kidney injury) ([place]) 04/01/2021   Syncope 04/01/2021   Near syncope 03/31/2021   Hypoglycemia 10/31/2018   Impaired functional mobility, balance, gait, and endurance 10/31/2018   Dilation of pancreatic duct 10/02/2018   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[place]) 09/28/2018    History of encephalopathy 09/28/2018   Vascular dementia without behavioral disturbance ([place])    History of hepatitis C    Hypothyroidism (acquired)    Gastroesophageal reflux disease    Aneurysm, cerebral, nonruptured    AMS (altered mental status) 08/17/2018   Gait disturbance 08/02/2018   Mild memory disturbance 08/02/2018   Hepatic encephalopathy 07/13/2018   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[place]) 07/13/2018   CKD (chronic kidney disease), stage III ([place]) 07/13/2018   Cervical stenosis of spine 05/29/2018   Degenerative disc disease, cervical 05/29/2018   Iron deficiency anemia due to chronic blood loss 09/12/2017   Rectal bleeding 06/06/2017   Acute renal failure (ARF) ([place]) 06/06/2017   Hyponatremia 06/06/2017   Hyperkalemia 06/06/2017   Anemia 06/06/2017   GI bleeding 06/06/2017   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[place])    Other dental procedure status 05/22/2017   Family history of CHF (congestive heart failure) Feb 02, 2017   Family history of sudden death 2017-02-02   Dyspnea 07/21/2016   Other disorder of circulatory system 04/12/2012   Phlebolithiasis 04/12/2012   Abnormal prostate by palpation 04/11/2012   Benign neoplasm of colon 04/11/2012   Degenerative joint disease of pelvic region 04/11/2012   Disorder of prostate 04/11/2012   Iron excess 04/11/2012   Lumbosacral spondylosis without myelopathy 04/11/2012   Nocturia 04/11/2012   Orthostatic hypotension 04/11/2012   Osteoarthritis of both hips 04/11/2012   Osteoarthritis of lumbar  spine 04/11/2012   Left sided sciatica 04/11/2012   History of colonic polyps 10/03/2011    REFERRING DIAG: Cervicalgia  THERAPY DIAG:  Cervicalgia  Neck stiffness  Muscle weakness (generalized)  Abnormal posture  Difficulty in walking, not elsewhere classified  PERTINENT HISTORY: OA, Cirrhosis, GERD, Hemophilia, Hepatitis C, Hypothyroidism  PRECAUTIONS: Fall  SUBJECTIVE: Pt reports being sore from new exercises.  PAIN:  Are you  having pain? Yes NPRS scale: 4/10 Pain location: cervical Pain orientation: Lower  PAIN TYPE: aching Pain description: intermittent  Aggravating factors: during cervical rotation Relieving factors: rest   OBJECTIVE:    DIAGNOSTIC FINDINGS:  No new imagine   PATIENT SURVEYS:  FOTO 46% (59% projected by visit 11)     COGNITION: Overall cognitive status: Within functional limits for tasks assessed   POSTURE:  Forward head, kyphotic thoracic spine   CERVICAL AROM/PROM   A/PROM A/PROM (deg) 12/22/2021  Flexion 45  Extension 10  Right lateral flexion 12  Left lateral flexion 10  Right rotation 40  Left rotation 40   (Blank rows = not tested)   UE MMT: Bilateral UE grossly 4/5 throughout   FUNCTIONAL TESTS:  5 times sit to stand: 15.9 sec with UE use Timed up and go (TUG): 16 sec without assistive device       TODAY'S TREATMENT:   12/31/21: Nustep L5 x 7 min with PTA present to discuss status Cervical AROM with "lift of eyes" the entire time 10x in each plane Half small noodle at upper thoracic: thoracic & cervical ext 2x10 Head press into ball 3 sec hold 2x10 Seated red tband scap unattached 20x each, shld flexion Bil 10x, rows 2x10 Wall push ups 2x10 Cones to second shelf: up & down, 1st shelf up & down 1x 6 cones   12/29/2021: Nustep L5 x6 min with PT present to discuss status Seated:  cervical retraction into ball, thoracic extension with ball behind back 2x10 each Cervical A/ROM flexion/extension, cervical rotation, and overhead reach 2x10 each Seated:  shoulder flexion, shoulder ER, horizontal abduction, abduction with yellow tband.  2x10 each bilat. Standing:  rows and shoulder extension with yellow tband 2x10 each bilat. Wall push ups 2x10  12/24/2021: Nustep L4 x6 min with PT present to discuss status Seated:  cervical retraction into ball, posterior shoulder rolls, scapular retraction 2x10 each Cervical A/ROM flexion/extension, cervical rotation, and  overhead reach 2x10 each Seated with yellow tband:  Shoulder flexion, shoulder scaption, shoulder ER and horizontal abduction 2x10 each Thoracic extension with ball behind back 2x10 Wall push ups 2x10  12/22/2021: Evaluation Only     PATIENT EDUCATION:  Education details: Pt educated in posture. Person educated: Patient Education method: Explanation Education comprehension: verbalized understanding     HOME EXERCISE PROGRAM: Access Code: [email or web address] URL: [email or web address] Date: 12/29/2021 Prepared by: Shelby Dubin [place]  Exercises Supine Cervical Retraction with Towel - 1-2 x daily - 7 x weekly - 2 sets - 10 reps - 2 sec hold Seated Scapular Retraction - 2 x daily - 7 x weekly - 2 sets - 10 reps Seated Cervical Rotation AROM - 2 x daily - 7 x weekly - 2 sets - 10 reps Seated Cervical Extension AROM - 2 x daily - 7 x weekly - 2 sets - 10 reps Seated Overhead Reach Stretch - 2 x daily - 7 x weekly - 2 sets - 10 reps Shoulder External Rotation and Scapular Retraction with Resistance - 1-2 x daily - 7 x weekly - 2 sets -  10 reps Standing Shoulder Horizontal Abduction with Resistance - 1-2 x daily - 7 x weekly - 2 sets - 10 reps Standing Shoulder Single Arm PNF D2 Flexion with Resistance - 1-2 x daily - 7 x weekly - 1 sets - 10 reps Seated Thoracic Extension with Hands Behind Neck - 1 x daily - 7 x weekly - 2 sets - 10 reps Standing Shoulder Flexion with Resistance - 1 x daily - 7 x weekly - 2 sets - 10 reps Standing Single Arm Shoulder Abduction with Resistance - 1 x daily - 7 x weekly - 2 sets - 10 reps Shoulder extension with resistance - Neutral - 1 x daily - 7 x weekly - 2 sets - 10 reps Standing Shoulder Row with Anchored Resistance - 1 x daily - 7 x weekly - 2 sets - 10 reps    ASSESSMENT:   CLINICAL IMPRESSION: Pt arrives with reports of a general soreness from his exercises last session. Pt was able to demonstrate improved ability to extend through his thoracic &  cervical spine today. Increased resistance to red band, pt had no issues.  Mainly required verbal cuing for postural corrections. Pt could not use his vision very well when performing the cone activity at second shelf, it was better at the first shelf, pt could use his vision better.      OBJECTIVE IMPAIRMENTS decreased balance, decreased ROM, decreased strength, impaired UE functional use, improper body mechanics, postural dysfunction, and pain.    ACTIVITY LIMITATIONS cleaning, community activity, driving, and meal prep.    PERSONAL FACTORS Age and 3+ comorbidities: OA, GERD, Cirrhosis  are also affecting patient's functional outcome.      REHAB POTENTIAL: Good   CLINICAL DECISION MAKING: Evolving/moderate complexity   EVALUATION COMPLEXITY: Moderate     GOALS: Goals reviewed with patient? Yes   SHORT TERM GOALS:   STG Name Target Date Goal status Goal Status  1 Pt will be independent with initial HEP. Baseline:  01/12/2022 Ongoing Met 12/31/21  2 Pt to report a 40% improvement in symptoms since starting PT. Baseline:  01/19/2022 Ongoing     LONG TERM GOALS:    LTG Name Target Date Goal status  1 Pt to be independent with advanced HEP. Baseline: 02/16/2022 INITIAL  2 Pt to increase FOTO to 59% to demonstrate his increased ability to perform activities in his home. Baseline: 02/16/2022 INITIAL  3 Pt to increase BUE strength to at least 4+/5 to allow him to lift items in home. Baseline: 02/16/2022 INITIAL  4 Pt to increase cervical A/ROM by at least 10 degrees to allow him the ability to perform overhead tasks. Baseline: 02/16/2022 INITIAL    PLAN: PT FREQUENCY: 2x/week   PT DURATION: 8 weeks   PLANNED INTERVENTIONS: Therapeutic exercises, Therapeutic activity, Neuromuscular re-education, Balance training, Gait training, Patient/Family education, Joint manipulation, Joint mobilization, Aquatic Therapy, Dry Needling, Electrical stimulation, Spinal manipulation, Spinal  mobilization, Cryotherapy, Moist heat, Taping, Traction, Ultrasound, Ionotophoresis 71m/ml Dexamethasone, and Manual therapy   PLAN FOR NEXT SESSION:  strengthening, flexibility, postural activities    Huey Scalia, PTA 12/31/2021, 11:01 AM

## 2022-01-05 ENCOUNTER — Other Ambulatory Visit: Payer: Self-pay

## 2022-01-05 ENCOUNTER — Encounter: Payer: Self-pay | Admitting: Rehabilitative and Restorative Service Providers"

## 2022-01-05 ENCOUNTER — Ambulatory Visit: Payer: Medicare Other | Admitting: Rehabilitative and Restorative Service Providers"

## 2022-01-05 DIAGNOSIS — M436 Torticollis: Secondary | ICD-10-CM

## 2022-01-05 DIAGNOSIS — M542 Cervicalgia: Secondary | ICD-10-CM | POA: Diagnosis not present

## 2022-01-05 DIAGNOSIS — M6281 Muscle weakness (generalized): Secondary | ICD-10-CM

## 2022-01-05 DIAGNOSIS — R293 Abnormal posture: Secondary | ICD-10-CM

## 2022-01-05 NOTE — Therapy (Signed)
?OUTPATIENT PHYSICAL THERAPY TREATMENT NOTE ? ? ?Patient Name: Justin Hampton ?MRN: [id number] ?DOB:1946-01-08, 76 y.o., male ?Today's Date: 01/05/2022 ? ?PCP: Bartholome Bill, MD ?REFERRING PROVIDER: Eldridge Abrahams, NP ? ? PT End of Session - 01/05/22 1019   ? ? Visit Number 5   ? Date for PT Re-Evaluation 02/11/22   ? Authorization Type UHC   ? PT Start Time 1015   ? PT Stop Time 1055   ? PT Time Calculation (min) 40 min   ? Activity Tolerance Patient tolerated treatment well   ? Behavior During Therapy [facility] for tasks assessed/performed   ? ?  ?  ? ?  ? ? ?Past Medical History:  ?Diagnosis Date  ? Adenomatous colon polyp   ? Arthritis   ? fingers  ? 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 Cirrhosis ([place])   ? Diverticulosis   ? GERD (gastroesophageal reflux disease)   ? 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 Hepatic encephalopathy 04/12/2012  ? Hepatitis C   ? treated  ? Hypoglycemia   ? Hypothyroidism   ? ?Past Surgical History:  ?Procedure Laterality Date  ? APPENDECTOMY    ? CARPAL TUNNEL RELEASE Bilateral   ? COLONOSCOPY    ? ESOPHAGOGASTRODUODENOSCOPY    ? x 2  ? ESOPHAGOGASTRODUODENOSCOPY (EGD) WITH PROPOFOL N/A 10/29/2018  ? Procedure: ESOPHAGOGASTRODUODENOSCOPY (EGD) WITH PROPOFOL;  Surgeon: Rush Landmark [name], MD;  Location: [place];  Service: Gastroenterology;  Laterality: N/A;  ? EUS N/A 10/29/2018  ? Procedure: UPPER ENDOSCOPIC ULTRASOUND (EUS) RADIAL;  Surgeon: Rush Landmark [name], MD;  Location: [employer];  Service: Gastroenterology;  Laterality: N/A;  ? ?Patient Active Problem List  ? Diagnosis Date Noted  ? Hypotension 04/01/2021  ? Lactic acidosis 04/01/2021  ? Loose stools 04/01/2021  ? AKI (acute kidney injury) ([place]) 04/01/2021  ? Syncope 04/01/2021  ? Near syncope 03/31/2021  ? Hypoglycemia 10/31/2018  ? Impaired functional mobility, balance, gait, and endurance 10/31/2018  ? Dilation of pancreatic duct 10/02/2018  ? 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   ? 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 History of encephalopathy 09/28/2018  ? Vascular dementia without behavioral disturbance ([place])   ? History of hepatitis C   ? Hypothyroidism (acquired)   ? Gastroesophageal reflux disease   ? Aneurysm, cerebral, nonruptured   ? AMS (altered mental status) 08/17/2018  ? Gait disturbance 08/02/2018  ? Mild memory disturbance 08/02/2018  ? Hepatic encephalopathy 07/13/2018  ? 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 CKD (chronic kidney disease), stage III ([place]) 07/13/2018  ? Cervical stenosis of spine 05/29/2018  ? Degenerative disc disease, cervical 05/29/2018  ? Iron deficiency anemia due to chronic blood loss 09/12/2017  ? Rectal bleeding 06/06/2017  ? Acute renal failure (ARF) ([place]) 06/06/2017  ? Hyponatremia 06/06/2017  ? Hyperkalemia 06/06/2017  ? Anemia 06/06/2017  ? GI bleeding 06/06/2017  ? 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 Other dental procedure status 05/22/2017  ? Family history of CHF (congestive heart failure) 06-Feb-2017  ? Family history of sudden death Feb 06, 2017  ? Dyspnea 25-Jul-2016  ? Other disorder of circulatory system 04/12/2012  ? Phlebolithiasis 04/12/2012  ? Abnormal prostate by palpation 04/11/2012  ? Benign neoplasm of colon 04/11/2012  ? Degenerative joint disease of pelvic region 04/11/2012  ? Disorder of prostate 04/11/2012  ? Iron excess 04/11/2012  ? Lumbosacral spondylosis without myelopathy 04/11/2012  ? Nocturia 04/11/2012  ?  Orthostatic hypotension 04/11/2012  ? Osteoarthritis of both hips 04/11/2012  ? 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 Left sided sciatica 04/11/2012  ? History of colonic polyps 10/03/2011  ? ? ?REFERRING DIAG: Cervicalgia ? ?THERAPY DIAG:  ?Cervicalgia ? ?Neck stiffness ? ?Muscle weakness (generalized) ? ?Abnormal posture ? ?PERTINENT HISTORY: OA, Cirrhosis, GERD, Hemophilia, Hepatitis C, Hypothyroidism ? ?PRECAUTIONS: Fall ? ?SUBJECTIVE: Pt reports feeling better from doing the  exercises. ? ?PAIN:  ?Are you having pain? Yes ?NPRS scale: 1/10 ?Pain location: cervical ?Pain orientation: Lower  ?PAIN TYPE: sore ?Pain description: intermittent  ?Aggravating factors: during cervical rotation ?Relieving factors: rest ? ? ?OBJECTIVE:  ?  ?DIAGNOSTIC FINDINGS:  ?No new imaging ?  ?PATIENT SURVEYS:  ?FOTO 46% (59% projected by visit 11) ?  ?  ?COGNITION: ?Overall cognitive status: Within functional limits for tasks assessed ?  ?POSTURE:  ?Forward head, kyphotic thoracic spine ?  ?CERVICAL AROM/PROM ?  ?A/PROM A/ROM (deg) ?12/22/2021 A/ROM (deg) ?01/05/2022  ?Flexion 45   ?Extension 10   ?Right lateral flexion 12   ?Left lateral flexion 10   ?Right rotation 40   ?Left rotation 40   ? (Blank rows = not tested) ?  ?UE MMT: ?Bilateral UE grossly 4/5 throughout ?  ?FUNCTIONAL TESTS:  ?5 times sit to stand: 15.9 sec with UE use ?Timed up and go (TUG): 16 sec without assistive device ?  ?  ?  ?TODAY'S TREATMENT:  ? ?01/05/2022: ?Nustep L5 x 7 min with PT present to discuss status ?Seated Chin tucks into ball with 2 sec hold 2x10 ?Half small noodle at upper thoracic: thoracic & cervical ext 2x10 ?Seated:  shoulder flexion, scaption, shoulder ER, horizontal abduction with red tband.  2x10 each bilat. ?Wall push ups 2x10 ?Reaching up to first shelf of cabinet with 1# 2x10 bilat, x10 reaching up to 2nd shelf bilat. ?3 way scapular stabilization x5 bilat with green loop ? ?12/31/2021: ?Nustep L5 x 7 min with PTA present to discuss status ?Cervical AROM with "lift of eyes" the entire time 10x in each plane ?Half small noodle at upper thoracic: thoracic & cervical ext 2x10 ?Head press into ball 3 sec hold 2x10 ?Seated red tband scap unattached 20x each, shld flexion Bil 10x, rows 2x10 ?Wall push ups 2x10 ?Cones to second shelf: up & down, 1st shelf up & down 1x 6 cones ?  ?12/29/2021: ?Nustep L5 x6 min with PT present to discuss status ?Seated:  cervical retraction into ball, thoracic extension with ball behind back  2x10 each ?Cervical A/ROM flexion/extension, cervical rotation, and overhead reach 2x10 each ?Seated:  shoulder flexion, shoulder ER, horizontal abduction, abduction with yellow tband.  2x10 each bilat. ?Standing:  rows and shoulder extension with yellow tband 2x10 each bilat. ?Wall push ups 2x10 ? ?  ?PATIENT EDUCATION:  ?Education details: Pt educated in posture. ?Person educated: Patient ?Education method: Explanation ?Education comprehension: verbalized understanding ?  ?  ?HOME EXERCISE PROGRAM: ?Access Code: [email or web address] ?URL: [email or web address] ?Date: 12/29/2021 ?Prepared by: Juel Burrow ? ?Exercises ?Supine Cervical Retraction with Towel - 1-2 x daily - 7 x weekly - 2 sets - 10 reps - 2 sec hold ?Seated Scapular Retraction - 2 x daily - 7 x weekly - 2 sets - 10 reps ?Seated Cervical Rotation AROM - 2 x daily - 7 x weekly - 2 sets - 10 reps ?Seated Cervical Extension AROM - 2 x daily - 7 x weekly - 2 sets - 10 reps ?Seated Overhead Reach Stretch - 2  x daily - 7 x weekly - 2 sets - 10 reps ?Shoulder External Rotation and Scapular Retraction with Resistance - 1-2 x daily - 7 x weekly - 2 sets - 10 reps ?Standing Shoulder Horizontal Abduction with Resistance - 1-2 x daily - 7 x weekly - 2 sets - 10 reps ?Standing Shoulder Single Arm PNF D2 Flexion with Resistance - 1-2 x daily - 7 x weekly - 1 sets - 10 reps ?Seated Thoracic Extension with Hands Behind Neck - 1 x daily - 7 x weekly - 2 sets - 10 reps ?Standing Shoulder Flexion with Resistance - 1 x daily - 7 x weekly - 2 sets - 10 reps ?Standing Single Arm Shoulder Abduction with Resistance - 1 x daily - 7 x weekly - 2 sets - 10 reps ?Shoulder extension with resistance - Neutral - 1 x daily - 7 x weekly - 2 sets - 10 reps ?Standing Shoulder Row with Anchored Resistance - 1 x daily - 7 x weekly - 2 sets - 10 reps ? ?  ?ASSESSMENT: ?  ?CLINICAL IMPRESSION: ?Mr Kerney continues to progress towards goal related activities.  He continues to progress  towards shoulder strengthening and PT continues to require cuing to "look up" during session.  Pt able to progress on all strengthening exercises and utilize red tband.  Pt continues to require skilled PT to progres

## 2022-01-10 ENCOUNTER — Encounter: Payer: Self-pay | Admitting: Physical Therapy

## 2022-01-10 ENCOUNTER — Ambulatory Visit: Payer: Medicare Other | Admitting: Physical Therapy

## 2022-01-10 ENCOUNTER — Other Ambulatory Visit: Payer: Self-pay

## 2022-01-10 DIAGNOSIS — R293 Abnormal posture: Secondary | ICD-10-CM

## 2022-01-10 DIAGNOSIS — M542 Cervicalgia: Secondary | ICD-10-CM

## 2022-01-10 DIAGNOSIS — M6281 Muscle weakness (generalized): Secondary | ICD-10-CM

## 2022-01-10 DIAGNOSIS — M436 Torticollis: Secondary | ICD-10-CM

## 2022-01-10 NOTE — Therapy (Signed)
?OUTPATIENT PHYSICAL THERAPY TREATMENT NOTE ? ? ?Patient Name: Justin Hampton ?MRN: [id number] ?DOB:31-Mar-1946, 76 y.o., male ?Today's Date: 01/10/2022 ? ?PCP: Bartholome Bill, MD ?REFERRING PROVIDER: Eldridge Abrahams, NP ? ? PT End of Session - 01/10/22 1106   ? ? Visit Number 6   ? Authorization Type UHC   ? PT Start Time 1100   ? PT Stop Time 1140   ? PT Time Calculation (min) 40 min   ? Activity Tolerance Patient tolerated treatment well   ? Behavior During Therapy [facility] for tasks assessed/performed   ? ?  ?  ? ?  ? ? ? ?Past Medical History:  ?Diagnosis Date  ? Adenomatous colon polyp   ? Arthritis   ? fingers  ? 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 Cirrhosis ([place])   ? Diverticulosis   ? GERD (gastroesophageal reflux disease)   ? 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 Hepatic encephalopathy 04/12/2012  ? Hepatitis C   ? treated  ? Hypoglycemia   ? Hypothyroidism   ? ?Past Surgical History:  ?Procedure Laterality Date  ? APPENDECTOMY    ? CARPAL TUNNEL RELEASE Bilateral   ? COLONOSCOPY    ? ESOPHAGOGASTRODUODENOSCOPY    ? x 2  ? ESOPHAGOGASTRODUODENOSCOPY (EGD) WITH PROPOFOL N/A 10/29/2018  ? Procedure: ESOPHAGOGASTRODUODENOSCOPY (EGD) WITH PROPOFOL;  Surgeon: Rush Landmark [name], MD;  Location: [place];  Service: Gastroenterology;  Laterality: N/A;  ? EUS N/A 10/29/2018  ? Procedure: UPPER ENDOSCOPIC ULTRASOUND (EUS) RADIAL;  Surgeon: Rush Landmark [name], MD;  Location: [place];  Service: Gastroenterology;  Laterality: N/A;  ? ?Patient Active Problem List  ? Diagnosis Date Noted  ? Hypotension 04/01/2021  ? Lactic acidosis 04/01/2021  ? Loose stools 04/01/2021  ? AKI (acute kidney injury) ([place]) 04/01/2021  ? Syncope 04/01/2021  ? Near syncope 03/31/2021  ? Hypoglycemia 10/31/2018  ? Impaired functional mobility, balance, gait, and endurance 10/31/2018  ? Dilation of pancreatic duct 10/02/2018  ? 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  ? Chronic hepatitis C with  cirrhosis ([place]) 09/28/2018  ? History of encephalopathy 09/28/2018  ? Vascular dementia without behavioral disturbance ([place])   ? History of hepatitis C   ? Hypothyroidism (acquired)   ? Gastroesophageal reflux disease   ? Aneurysm, cerebral, nonruptured   ? AMS (altered mental status) 08/17/2018  ? Gait disturbance 08/02/2018  ? Mild memory disturbance 08/02/2018  ? Hepatic encephalopathy 07/13/2018  ? 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 CKD (chronic kidney disease), stage III ([place]) 07/13/2018  ? Cervical stenosis of spine 05/29/2018  ? Degenerative disc disease, cervical 05/29/2018  ? Iron deficiency anemia due to chronic blood loss 09/12/2017  ? Rectal bleeding 06/06/2017  ? Acute renal failure (ARF) ([place]) 06/06/2017  ? Hyponatremia 06/06/2017  ? Hyperkalemia 06/06/2017  ? Anemia 06/06/2017  ? GI bleeding 06/06/2017  ? 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 Other dental procedure status 05/22/2017  ? Family history of CHF (congestive heart failure) January 21, 2017  ? Family history of sudden death 2017/01/21  ? Dyspnea 07/09/2016  ? Other disorder of circulatory system 04/12/2012  ? Phlebolithiasis 04/12/2012  ? Abnormal prostate by palpation 04/11/2012  ? Benign neoplasm of colon 04/11/2012  ? Degenerative joint disease of pelvic region 04/11/2012  ? Disorder of prostate 04/11/2012  ? Iron excess 04/11/2012  ? Lumbosacral spondylosis without myelopathy 04/11/2012  ? Nocturia 04/11/2012  ? Orthostatic hypotension 04/11/2012  ? Osteoarthritis  of both hips 04/11/2012  ? 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 Left sided sciatica 04/11/2012  ? History of colonic polyps 10/03/2011  ? ? ?REFERRING DIAG: Cervicalgia ? ?THERAPY DIAG:  ?Cervicalgia ? ?Neck stiffness ? ?Muscle weakness (generalized) ? ?Abnormal posture ? ?PERTINENT HISTORY: OA, Cirrhosis, GERD, Hemophilia, Hepatitis C, Hypothyroidism ? ?PRECAUTIONS: Fall ? ?SUBJECTIVE: Pt reports feeling better from doing the exercises. No new complaints this AM.   ? ?PAIN:  ?Are you having pain? No ?NPRS scale: 0/10 ?Pain location: cervical ?Pain orientation: Lower  ?PAIN TYPE: sore ?Pain description: intermittent  ?Aggravating factors: during cervical rotation ?Relieving factors: rest ? ? ?OBJECTIVE:  ?  ?DIAGNOSTIC FINDINGS:  ?No new imaging ?  ?PATIENT SURVEYS:  ?FOTO 46% (59% projected by visit 11) ?  ?  ?COGNITION: ?Overall cognitive status: Within functional limits for tasks assessed ?  ?POSTURE:  ?Forward head, kyphotic thoracic spine ?  ?CERVICAL AROM/PROM ?  ?A/PROM A/ROM (deg) ?12/22/2021 A/ROM (deg) ?01/05/2022  ?Flexion 45   ?Extension 10   ?Right lateral flexion 12   ?Left lateral flexion 10   ?Right rotation 40   ?Left rotation 40   ? (Blank rows = not tested) ?  ?UE MMT: ?Bilateral UE grossly 4/5 throughout ?  ?FUNCTIONAL TESTS:  ?5 times sit to stand: 15.9 sec with UE use ?Timed up and go (TUG): 16 sec without assistive device ?  ?  ?  ?TODAY'S TREATMENT:  ? ? 01/10/22:  ?Nustep L5 x 8 min; 30 sec look up/30 sec rest. PTA present to montior and discuss current status ?Seated Chin tucks into ball with 5 sec hold 2x10  ?Half small noodle at upper thoracic: thoracic & cervical ext 2x12 ?Seated:  shoulder flexion, scaption, shoulder ER, horizontal abduction with red tband.  2x10 each bilat. ?Reaching up to first shelf of cabinet with 2# 2x10 bilat, x10 reaching up to 2nd shelf bilat. ?3 way scapular stabilization x5 bilat with green loop ?Wall push ups 2x12 ? ?01/05/2022: ?Nustep L5 x 7 min with PT present to discuss status ?Seated Chin tucks into ball with 2 sec hold 2x10 ?Half small noodle at upper thoracic: thoracic & cervical ext 2x10 ?Seated:  shoulder flexion, scaption, shoulder ER, horizontal abduction with red tband.  2x10 each bilat. ?Wall push ups 2x10 ?Reaching up to first shelf of cabinet with 1# 2x10 bilat, x10 reaching up to 2nd shelf bilat. ?3 way scapular stabilization x5 bilat with green loop ?Walk 100 ft 2x with VC to look at object that made pt have  to extend neck & spine. ? ?12/31/2021: ?Nustep L5 x 7 min with PTA present to discuss status ?Cervical AROM with "lift of eyes" the entire time 10x in each plane ?Half small noodle at upper thoracic: thoracic & cervical ext 2x10 ?Head press into ball 3 sec hold 2x10 ?Seated red tband scap unattached 20x each, shld flexion Bil 10x, rows 2x10 ?Wall push ups 2x10 ?Cones to second shelf: up & down, 1st shelf up & down 1x 6 cones ? ?  ?PATIENT EDUCATION:  ?Education details: Pt educated in posture. ?Person educated: Patient ?Education method: Explanation ?Education comprehension: verbalized understanding ?  ?  ?HOME EXERCISE PROGRAM: ?Access Code: [email or web address] ?URL: [email or web address] ?Date: 12/29/2021 ?Prepared by: Juel Burrow ? ?Exercises ?Supine Cervical Retraction with Towel - 1-2 x daily - 7 x weekly - 2 sets - 10 reps - 2 sec hold ?Seated Scapular Retraction - 2 x daily - 7 x weekly - 2 sets - 10 reps ?  Seated Cervical Rotation AROM - 2 x daily - 7 x weekly - 2 sets - 10 reps ?Seated Cervical Extension AROM - 2 x daily - 7 x weekly - 2 sets - 10 reps ?Seated Overhead Reach Stretch - 2 x daily - 7 x weekly - 2 sets - 10 reps ?Shoulder External Rotation and Scapular Retraction with Resistance - 1-2 x daily - 7 x weekly - 2 sets - 10 reps ?Standing Shoulder Horizontal Abduction with Resistance - 1-2 x daily - 7 x weekly - 2 sets - 10 reps ?Standing Shoulder Single Arm PNF D2 Flexion with Resistance - 1-2 x daily - 7 x weekly - 1 sets - 10 reps ?Seated Thoracic Extension with Hands Behind Neck - 1 x daily - 7 x weekly - 2 sets - 10 reps ?Standing Shoulder Flexion with Resistance - 1 x daily - 7 x weekly - 2 sets - 10 reps ?Standing Single Arm Shoulder Abduction with Resistance - 1 x daily - 7 x weekly - 2 sets - 10 reps ?Shoulder extension with resistance - Neutral - 1 x daily - 7 x weekly - 2 sets - 10 reps ?Standing Shoulder Row with Anchored Resistance - 1 x daily - 7 x weekly - 2 sets - 10 reps ? ?   ?ASSESSMENT: ?  ?CLINICAL IMPRESSION: ?Mr Cinque continues to progress towards goal related activities.  He continues to progress towards shoulder strengthening and PTA continues to require cuing to "look up" du

## 2022-01-12 ENCOUNTER — Encounter: Payer: Self-pay | Admitting: Rehabilitative and Restorative Service Providers"

## 2022-01-12 ENCOUNTER — Ambulatory Visit: Payer: Medicare Other | Admitting: Rehabilitative and Restorative Service Providers"

## 2022-01-12 ENCOUNTER — Other Ambulatory Visit: Payer: Self-pay

## 2022-01-12 DIAGNOSIS — M542 Cervicalgia: Secondary | ICD-10-CM | POA: Diagnosis not present

## 2022-01-12 DIAGNOSIS — M6281 Muscle weakness (generalized): Secondary | ICD-10-CM

## 2022-01-12 DIAGNOSIS — R293 Abnormal posture: Secondary | ICD-10-CM

## 2022-01-12 DIAGNOSIS — M436 Torticollis: Secondary | ICD-10-CM

## 2022-01-12 NOTE — Therapy (Addendum)
?OUTPATIENT PHYSICAL THERAPY TREATMENT NOTE ? ? ?Patient Name: Justin Hampton ?MRN: [id number] ?DOB:February 14, 1946, 76 y.o., male ?Today's Date: 01/12/2022 ? ?PCP: Bartholome Bill, MD ?REFERRING PROVIDER: Eldridge Abrahams, NP ? ? PT End of Session - 01/12/22 1158   ? ? Visit Number 7   ? Date for PT Re-Evaluation 02/11/22   ? Authorization Type UHC   ? PT Start Time 1147   ? PT Stop Time 1225   ? PT Time Calculation (min) 38 min   ? Activity Tolerance Patient tolerated treatment well   ? Behavior During Therapy [facility] for tasks assessed/performed   ? ?  ?  ? ?  ? ? ? ?Past Medical History:  ?Diagnosis Date  ? Adenomatous colon polyp   ? Arthritis   ? fingers  ? 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 Cirrhosis ([place])   ? Diverticulosis   ? GERD (gastroesophageal reflux disease)   ? 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 Hepatic encephalopathy 04/12/2012  ? Hepatitis C   ? treated  ? Hypoglycemia   ? Hypothyroidism   ? ?Past Surgical History:  ?Procedure Laterality Date  ? APPENDECTOMY    ? CARPAL TUNNEL RELEASE Bilateral   ? COLONOSCOPY    ? ESOPHAGOGASTRODUODENOSCOPY    ? x 2  ? ESOPHAGOGASTRODUODENOSCOPY (EGD) WITH PROPOFOL N/A 10/29/2018  ? Procedure: ESOPHAGOGASTRODUODENOSCOPY (EGD) WITH PROPOFOL;  Surgeon: Rush Landmark [name], MD;  Location: [place];  Service: Gastroenterology;  Laterality: N/A;  ? EUS N/A 10/29/2018  ? Procedure: UPPER ENDOSCOPIC ULTRASOUND (EUS) RADIAL;  Surgeon: Rush Landmark [name], MD;  Location: [place];  Service: Gastroenterology;  Laterality: N/A;  ? ?Patient Active Problem List  ? Diagnosis Date Noted  ? Hypotension 04/01/2021  ? Lactic acidosis 04/01/2021  ? Loose stools 04/01/2021  ? AKI (acute kidney injury) ([place]) 04/01/2021  ? Syncope 04/01/2021  ? Near syncope 03/31/2021  ? Hypoglycemia 10/31/2018  ? Impaired functional mobility, balance, gait, and endurance 10/31/2018  ? Dilation of pancreatic duct 10/02/2018  ? Hepatitis B core antibody positive  09/28/2018  ? 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 History of encephalopathy 09/28/2018  ? Vascular dementia without behavioral disturbance ([place])   ? History of hepatitis C   ? Hypothyroidism (acquired)   ? Gastroesophageal reflux disease   ? Aneurysm, cerebral, nonruptured   ? AMS (altered mental status) 08/17/2018  ? Gait disturbance 08/02/2018  ? Mild memory disturbance 08/02/2018  ? Hepatic encephalopathy 07/13/2018  ? 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 CKD (chronic kidney disease), stage III ([place]) 07/13/2018  ? Cervical stenosis of spine 05/29/2018  ? Degenerative disc disease, cervical 05/29/2018  ? Iron deficiency anemia due to chronic blood loss 09/12/2017  ? Rectal bleeding 06/06/2017  ? Acute renal failure (ARF) ([place]) 06/06/2017  ? Hyponatremia 06/06/2017  ? Hyperkalemia 06/06/2017  ? Anemia 06/06/2017  ? GI bleeding 06/06/2017  ? 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 Other dental procedure status 05/22/2017  ? Family history of CHF (congestive heart failure) 01-22-17  ? Family history of sudden death 01/22/17  ? Dyspnea 07-10-16  ? Other disorder of circulatory system 04/12/2012  ? Phlebolithiasis 04/12/2012  ? Abnormal prostate by palpation 04/11/2012  ? Benign neoplasm of colon 04/11/2012  ? Degenerative joint disease of pelvic region 04/11/2012  ? Disorder of prostate 04/11/2012  ? Iron excess 04/11/2012  ? Lumbosacral spondylosis without myelopathy 04/11/2012  ? Nocturia 04/11/2012  ?  Orthostatic hypotension 04/11/2012  ? Osteoarthritis of both hips 04/11/2012  ? 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 Left sided sciatica 04/11/2012  ? History of colonic polyps 10/03/2011  ? ? ?REFERRING DIAG: Cervicalgia ? ?THERAPY DIAG:  ?Cervicalgia ? ?Neck stiffness ? ?Muscle weakness (generalized) ? ?Abnormal posture ? ?PERTINENT HISTORY: OA, Cirrhosis, GERD, Hemophilia, Hepatitis C, Hypothyroidism ? ?PRECAUTIONS: Fall ? ?SUBJECTIVE: Pt reports feeling better from doing the  exercises. No new complaints this AM.  ? ?PAIN:  ?Are you having pain? No ?NPRS scale: 0/10 ?Pain location: cervical ?Pain orientation: Lower  ?PAIN TYPE: sore ?Pain description: intermittent  ?Aggravating factors: during cervical rotation ?Relieving factors: rest ? ? ?OBJECTIVE:  ?  ?DIAGNOSTIC FINDINGS:  ?No new imaging ?  ?PATIENT SURVEYS:  ?FOTO 46% (59% projected by visit 11) ?  ?  ?COGNITION: ?Overall cognitive status: Within functional limits for tasks assessed ?  ?POSTURE:  ?Forward head, kyphotic thoracic spine ?  ?CERVICAL AROM/PROM ?  ?A/PROM A/ROM (deg) ?12/22/2021 A/ROM (deg) ?01/05/2022  ?Flexion 45   ?Extension 10   ?Right lateral flexion 12   ?Left lateral flexion 10   ?Right rotation 40   ?Left rotation 40   ? (Blank rows = not tested) ?  ?UE MMT: ?Bilateral UE grossly 4/5 throughout ?  ?FUNCTIONAL TESTS:  ?5 times sit to stand: 15.9 sec with UE use ?Timed up and go (TUG): 16 sec without assistive device ?  ?  ?  ?TODAY'S TREATMENT:  ? ? 01/12/2022: ?Nustep L5 x 8 min. PT present to montior and discuss current status ?Seated:  shoulder flexion, scaption, shoulder ER, horizontal abduction with red tband.  2x10 each bilat. ?Standing:  shoulder rows and extension with red tband 2x10 reps ?Sitting cervical extension A/ROM 2x10 ?Sit to/from stand holding 5# kettlebell:  x10 with chest press, x10 with overhead press ? ?01/10/22:  ?Nustep L5 x 8 min; 30 sec look up/30 sec rest. PTA present to montior and discuss current status ?Seated Chin tucks into ball with 5 sec hold 2x10  ?Half small noodle at upper thoracic: thoracic & cervical ext 2x12 ?Seated:  shoulder flexion, scaption, shoulder ER, horizontal abduction with red tband.  2x10 each bilat. ?Reaching up to first shelf of cabinet with 2# 2x10 bilat, x10 reaching up to 2nd shelf bilat. ?3 way scapular stabilization x5 bilat with green loop ?Wall push ups 2x12 ? ?01/05/2022: ?Nustep L5 x 7 min with PT present to discuss status ?Seated Chin tucks into ball  with 2 sec hold 2x10 ?Half small noodle at upper thoracic: thoracic & cervical ext 2x10 ?Seated:  shoulder flexion, scaption, shoulder ER, horizontal abduction with red tband.  2x10 each bilat. ?Wall push ups 2x10 ?Reaching up to first shelf of cabinet with 1# 2x10 bilat, x10 reaching up to 2nd shelf bilat. ?3 way scapular stabilization x5 bilat with green loop ?Walk 100 ft 2x with VC to look at object that made pt have to extend neck & spine. ? ?  ?PATIENT EDUCATION:  ?Education details: Pt educated in posture. ?Person educated: Patient ?Education method: Explanation ?Education comprehension: verbalized understanding ?  ?  ?HOME EXERCISE PROGRAM: ?Access Code: [email or web address] ?URL: [email or web address] ?Date: 12/29/2021 ?Prepared by: Juel Burrow ? ?Exercises ?Supine Cervical Retraction with Towel - 1-2 x daily - 7 x weekly - 2 sets - 10 reps - 2 sec hold ?Seated Scapular Retraction - 2 x daily - 7 x weekly - 2 sets - 10 reps ?Seated Cervical Rotation AROM - 2 x daily -  7 x weekly - 2 sets - 10 reps ?Seated Cervical Extension AROM - 2 x daily - 7 x weekly - 2 sets - 10 reps ?Seated Overhead Reach Stretch - 2 x daily - 7 x weekly - 2 sets - 10 reps ?Shoulder External Rotation and Scapular Retraction with Resistance - 1-2 x daily - 7 x weekly - 2 sets - 10 reps ?Standing Shoulder Horizontal Abduction with Resistance - 1-2 x daily - 7 x weekly - 2 sets - 10 reps ?Standing Shoulder Single Arm PNF D2 Flexion with Resistance - 1-2 x daily - 7 x weekly - 1 sets - 10 reps ?Seated Thoracic Extension with Hands Behind Neck - 1 x daily - 7 x weekly - 2 sets - 10 reps ?Standing Shoulder Flexion with Resistance - 1 x daily - 7 x weekly - 2 sets - 10 reps ?Standing Single Arm Shoulder Abduction with Resistance - 1 x daily - 7 x weekly - 2 sets - 10 reps ?Shoulder extension with resistance - Neutral - 1 x daily - 7 x weekly - 2 sets - 10 reps ?Standing Shoulder Row with Anchored Resistance - 1 x daily - 7 x weekly - 2  sets - 10 reps ? ?  ?ASSESSMENT: ?  ?CLINICAL IMPRESSION: ?Mr Cavanagh continues to progress towards goal related activities.  Pt reports feeling approximately 60% better from when he presented at initial evaluation.

## 2022-01-18 ENCOUNTER — Encounter: Payer: Self-pay | Admitting: Rehabilitative and Restorative Service Providers"

## 2022-01-18 ENCOUNTER — Ambulatory Visit: Payer: Medicare Other | Admitting: Rehabilitative and Restorative Service Providers"

## 2022-01-18 ENCOUNTER — Other Ambulatory Visit: Payer: Self-pay

## 2022-01-18 DIAGNOSIS — R293 Abnormal posture: Secondary | ICD-10-CM

## 2022-01-18 DIAGNOSIS — M542 Cervicalgia: Secondary | ICD-10-CM

## 2022-01-18 DIAGNOSIS — M6281 Muscle weakness (generalized): Secondary | ICD-10-CM

## 2022-01-18 DIAGNOSIS — M436 Torticollis: Secondary | ICD-10-CM

## 2022-01-18 NOTE — Therapy (Signed)
?OUTPATIENT PHYSICAL THERAPY TREATMENT NOTE ? ? ?Patient Name: Justin Hampton ?MRN: [id number] ?DOB:30-Apr-1946, 76 y.o., male ?Today's Date: 01/18/2022 ? ?PCP: Bartholome Bill, MD ?REFERRING PROVIDER: Eldridge Abrahams, NP ? ? PT End of Session - 01/18/22 0847   ? ? Visit Number 8   ? Date for PT Re-Evaluation 02/11/22   ? Authorization Type UHC   ? PT Start Time 0845   ? PT Stop Time 0925   ? PT Time Calculation (min) 40 min   ? Activity Tolerance Patient tolerated treatment well   ? Behavior During Therapy [facility] for tasks assessed/performed   ? ?  ?  ? ?  ? ? ? ?Past Medical History:  ?Diagnosis Date  ? Adenomatous colon polyp   ? Arthritis   ? fingers  ? 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 Cirrhosis ([place])   ? Diverticulosis   ? GERD (gastroesophageal reflux disease)   ? 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 Hepatic encephalopathy 04/12/2012  ? Hepatitis C   ? treated  ? Hypoglycemia   ? Hypothyroidism   ? ?Past Surgical History:  ?Procedure Laterality Date  ? APPENDECTOMY    ? CARPAL TUNNEL RELEASE Bilateral   ? COLONOSCOPY    ? ESOPHAGOGASTRODUODENOSCOPY    ? x 2  ? ESOPHAGOGASTRODUODENOSCOPY (EGD) WITH PROPOFOL N/A 10/29/2018  ? Procedure: ESOPHAGOGASTRODUODENOSCOPY (EGD) WITH PROPOFOL;  Surgeon: Rush Landmark [name], MD;  Location: [place];  Service: Gastroenterology;  Laterality: N/A;  ? EUS N/A 10/29/2018  ? Procedure: UPPER ENDOSCOPIC ULTRASOUND (EUS) RADIAL;  Surgeon: Rush Landmark [name], MD;  Location: [place];  Service: Gastroenterology;  Laterality: N/A;  ? ?Patient Active Problem List  ? Diagnosis Date Noted  ? Hypotension 04/01/2021  ? Lactic acidosis 04/01/2021  ? Loose stools 04/01/2021  ? AKI (acute kidney injury) ([place]) 04/01/2021  ? Syncope 04/01/2021  ? Near syncope 03/31/2021  ? Hypoglycemia 10/31/2018  ? Impaired functional mobility, balance, gait, and endurance 10/31/2018  ? Dilation of pancreatic duct 10/02/2018  ? Hepatitis B core antibody positive  09/28/2018  ? 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 History of encephalopathy 09/28/2018  ? Vascular dementia without behavioral disturbance ([place])   ? History of hepatitis C   ? Hypothyroidism (acquired)   ? Gastroesophageal reflux disease   ? Aneurysm, cerebral, nonruptured   ? AMS (altered mental status) 08/17/2018  ? Gait disturbance 08/02/2018  ? Mild memory disturbance 08/02/2018  ? Hepatic encephalopathy 07/13/2018  ? 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 CKD (chronic kidney disease), stage III ([place]) 07/13/2018  ? Cervical stenosis of spine 05/29/2018  ? Degenerative disc disease, cervical 05/29/2018  ? Iron deficiency anemia due to chronic blood loss 09/12/2017  ? Rectal bleeding 06/06/2017  ? Acute renal failure (ARF) ([place]) 06/06/2017  ? Hyponatremia 06/06/2017  ? Hyperkalemia 06/06/2017  ? Anemia 06/06/2017  ? GI bleeding 06/06/2017  ? 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 Other dental procedure status 05/22/2017  ? Family history of CHF (congestive heart failure) 01/27/2017  ? Family history of sudden death Jan 27, 2017  ? Dyspnea Jul 15, 2016  ? Other disorder of circulatory system 04/12/2012  ? Phlebolithiasis 04/12/2012  ? Abnormal prostate by palpation 04/11/2012  ? Benign neoplasm of colon 04/11/2012  ? Degenerative joint disease of pelvic region 04/11/2012  ? Disorder of prostate 04/11/2012  ? Iron excess 04/11/2012  ? Lumbosacral spondylosis without myelopathy 04/11/2012  ? Nocturia 04/11/2012  ?  Orthostatic hypotension 04/11/2012  ? Osteoarthritis of both hips 04/11/2012  ? 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 Left sided sciatica 04/11/2012  ? History of colonic polyps 10/03/2011  ? ? ?REFERRING DIAG: Cervicalgia ? ?THERAPY DIAG:  ?Cervicalgia ? ?Neck stiffness ? ?Muscle weakness (generalized) ? ?Abnormal posture ? ?PERTINENT HISTORY: OA, Cirrhosis, GERD, Hemophilia, Hepatitis C, Hypothyroidism ? ?PRECAUTIONS: Fall ? ?SUBJECTIVE: No new complaints this AM.  ? ?PAIN:  ?Are  you having pain? No ?NPRS scale: 0/10 ?Pain location: cervical ?Pain orientation: Lower  ?PAIN TYPE: sore ?Pain description: intermittent  ?Aggravating factors: during cervical rotation ?Relieving factors: rest ? ? ?OBJECTIVE:  ?  ?DIAGNOSTIC FINDINGS:  ?No new imaging ?  ?PATIENT SURVEYS:  ?FOTO 46% (59% projected by visit 11) ?  ?  ?COGNITION: ?Overall cognitive status: Within functional limits for tasks assessed ?  ?POSTURE:  ?Forward head, kyphotic thoracic spine ?  ?CERVICAL AROM/PROM ?  ?A/PROM A/ROM (deg) ?12/22/2021 A/ROM (deg) ?01/05/2022  ?Flexion 45   ?Extension 10   ?Right lateral flexion 12   ?Left lateral flexion 10   ?Right rotation 40   ?Left rotation 40   ? (Blank rows = not tested) ?  ?UE MMT: ?Bilateral UE grossly 4/5 throughout ?  ?FUNCTIONAL TESTS:  ? ?01/18/2022: ?Timed up and go (TUG): 16.1 sec without assistive device ? ?12/22/2021: ?5 times sit to stand: 15.9 sec with UE use ?Timed up and go (TUG): 16 sec without assistive device ?  ?  ?  ?TODAY'S TREATMENT:  ? ?01/18/2022:  ?Nustep L5 x 8 min. PT present to montior and discuss current status ?Seated:  shoulder flexion, scaption, shoulder ER, horizontal abduction with red tband.  2x10 each bilat. ?Thoracic extension witting against ball 2x10 ?Sitting cervical extension A/ROM 2x10 ?Standing:  shoulder rows and extension with red tband 2x10 reps ?Sit to/from stand holding 5# kettlebell:  x10 with chest press, x10 with overhead press ?  ?01/12/2022: ?Nustep L5 x 8 min. PT present to montior and discuss current status ?Seated:  shoulder flexion, scaption, shoulder ER, horizontal abduction with red tband.  2x10 each bilat. ?Standing:  shoulder rows and extension with red tband 2x10 reps ?Sitting cervical extension A/ROM 2x10 ?Sit to/from stand holding 5# kettlebell:  x10 with chest press, x10 with overhead press ? ?01/10/22:  ?Nustep L5 x 8 min; 30 sec look up/30 sec rest. PTA present to montior and discuss current status ?Seated Chin tucks into ball with  5 sec hold 2x10  ?Half small noodle at upper thoracic: thoracic & cervical ext 2x12 ?Seated:  shoulder flexion, scaption, shoulder ER, horizontal abduction with red tband.  2x10 each bilat. ?Reaching up to first shelf of cabinet with 2# 2x10 bilat, x10 reaching up to 2nd shelf bilat. ?3 way scapular stabilization x5 bilat with green loop ?Wall push ups 2x12 ? ? ?  ?PATIENT EDUCATION:  ?Education details: Pt educated in posture. ?Person educated: Patient ?Education method: Explanation ?Education comprehension: verbalized understanding ?  ?  ?HOME EXERCISE PROGRAM: ?Access Code: [email or web address] ?URL: [email or web address] ?Date: 12/29/2021 ?Prepared by: Juel Burrow ? ?Exercises ?Supine Cervical Retraction with Towel - 1-2 x daily - 7 x weekly - 2 sets - 10 reps - 2 sec hold ?Seated Scapular Retraction - 2 x daily - 7 x weekly - 2 sets - 10 reps ?Seated Cervical Rotation AROM - 2 x daily - 7 x weekly - 2 sets - 10 reps ?Seated Cervical Extension AROM - 2 x daily - 7 x weekly - 2 sets -  10 reps ?Seated Overhead Reach Stretch - 2 x daily - 7 x weekly - 2 sets - 10 reps ?Shoulder External Rotation and Scapular Retraction with Resistance - 1-2 x daily - 7 x weekly - 2 sets - 10 reps ?Standing Shoulder Horizontal Abduction with Resistance - 1-2 x daily - 7 x weekly - 2 sets - 10 reps ?Standing Shoulder Single Arm PNF D2 Flexion with Resistance - 1-2 x daily - 7 x weekly - 1 sets - 10 reps ?Seated Thoracic Extension with Hands Behind Neck - 1 x daily - 7 x weekly - 2 sets - 10 reps ?Standing Shoulder Flexion with Resistance - 1 x daily - 7 x weekly - 2 sets - 10 reps ?Standing Single Arm Shoulder Abduction with Resistance - 1 x daily - 7 x weekly - 2 sets - 10 reps ?Shoulder extension with resistance - Neutral - 1 x daily - 7 x weekly - 2 sets - 10 reps ?Standing Shoulder Row with Anchored Resistance - 1 x daily - 7 x weekly - 2 sets - 10 reps ? ?  ?ASSESSMENT: ?  ?CLINICAL IMPRESSION: ?Mr Spieker continues to  progress towards goal related activities.  Pt tolerated session well and without complaints of increased pain during session.  TUG performed at end of session, as pt was fatigued.  Pt reports that he had to wake

## 2022-01-19 ENCOUNTER — Encounter: Payer: Medicare Other | Admitting: Rehabilitative and Restorative Service Providers"

## 2022-01-21 ENCOUNTER — Encounter: Payer: Self-pay | Admitting: Physical Therapy

## 2022-01-21 ENCOUNTER — Ambulatory Visit: Payer: Medicare Other | Admitting: Physical Therapy

## 2022-01-21 DIAGNOSIS — R262 Difficulty in walking, not elsewhere classified: Secondary | ICD-10-CM

## 2022-01-21 DIAGNOSIS — M6281 Muscle weakness (generalized): Secondary | ICD-10-CM

## 2022-01-21 DIAGNOSIS — R293 Abnormal posture: Secondary | ICD-10-CM

## 2022-01-21 DIAGNOSIS — M436 Torticollis: Secondary | ICD-10-CM

## 2022-01-21 DIAGNOSIS — M542 Cervicalgia: Secondary | ICD-10-CM | POA: Diagnosis not present

## 2022-01-21 NOTE — Therapy (Signed)
?OUTPATIENT PHYSICAL THERAPY TREATMENT NOTE ? ? ?Patient Name: Justin Hampton ?MRN: [id number] ?DOB:01-12-1946, 76 y.o., male ?Today's Date: 01/21/2022 ? ?PCP: Bartholome Bill, MD ?REFERRING PROVIDER: Eldridge Abrahams, NP ? ? PT End of Session - 01/21/22 1056   ? ? Visit Number 9   ? Date for PT Re-Evaluation 02/11/22   ? Authorization Type UHC   ? PT Start Time 1055   ? PT Stop Time 1133   ? PT Time Calculation (min) 38 min   ? Activity Tolerance Patient tolerated treatment well   ? Behavior During Therapy [facility] for tasks assessed/performed   ? ?  ?  ? ?  ? ? ? ? ?Past Medical History:  ?Diagnosis Date  ? Adenomatous colon polyp   ? Arthritis   ? fingers  ? 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 Cirrhosis ([place])   ? Diverticulosis   ? GERD (gastroesophageal reflux disease)   ? 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 Hepatic encephalopathy 04/12/2012  ? Hepatitis C   ? treated  ? Hypoglycemia   ? Hypothyroidism   ? ?Past Surgical History:  ?Procedure Laterality Date  ? APPENDECTOMY    ? CARPAL TUNNEL RELEASE Bilateral   ? COLONOSCOPY    ? ESOPHAGOGASTRODUODENOSCOPY    ? x 2  ? ESOPHAGOGASTRODUODENOSCOPY (EGD) WITH PROPOFOL N/A 10/29/2018  ? Procedure: ESOPHAGOGASTRODUODENOSCOPY (EGD) WITH PROPOFOL;  Surgeon: Rush Landmark [name], MD;  Location: [place];  Service: Gastroenterology;  Laterality: N/A;  ? EUS N/A 10/29/2018  ? Procedure: UPPER ENDOSCOPIC ULTRASOUND (EUS) RADIAL;  Surgeon: Rush Landmark [name], MD;  Location: [place];  Service: Gastroenterology;  Laterality: N/A;  ? ?Patient Active Problem List  ? Diagnosis Date Noted  ? Hypotension 04/01/2021  ? Lactic acidosis 04/01/2021  ? Loose stools 04/01/2021  ? AKI (acute kidney injury) ([place]) 04/01/2021  ? Syncope 04/01/2021  ? Near syncope 03/31/2021  ? Hypoglycemia 10/31/2018  ? Impaired functional mobility, balance, gait, and endurance 10/31/2018  ? Dilation of pancreatic duct 10/02/2018  ? Hepatitis B core antibody positive  09/28/2018  ? 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 History of encephalopathy 09/28/2018  ? Vascular dementia without behavioral disturbance ([place])   ? History of hepatitis C   ? Hypothyroidism (acquired)   ? Gastroesophageal reflux disease   ? Aneurysm, cerebral, nonruptured   ? AMS (altered mental status) 08/17/2018  ? Gait disturbance 08/02/2018  ? Mild memory disturbance 08/02/2018  ? Hepatic encephalopathy 07/13/2018  ? 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 CKD (chronic kidney disease), stage III ([place]) 07/13/2018  ? Cervical stenosis of spine 05/29/2018  ? Degenerative disc disease, cervical 05/29/2018  ? Iron deficiency anemia due to chronic blood loss 09/12/2017  ? Rectal bleeding 06/06/2017  ? Acute renal failure (ARF) ([place]) 06/06/2017  ? Hyponatremia 06/06/2017  ? Hyperkalemia 06/06/2017  ? Anemia 06/06/2017  ? GI bleeding 06/06/2017  ? 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 Other dental procedure status 05/22/2017  ? Family history of CHF (congestive heart failure) 01-22-17  ? Family history of sudden death 01-22-2017  ? Dyspnea July 10, 2016  ? Other disorder of circulatory system 04/12/2012  ? Phlebolithiasis 04/12/2012  ? Abnormal prostate by palpation 04/11/2012  ? Benign neoplasm of colon 04/11/2012  ? Degenerative joint disease of pelvic region 04/11/2012  ? Disorder of prostate 04/11/2012  ? Iron excess 04/11/2012  ? Lumbosacral spondylosis without myelopathy 04/11/2012  ? Nocturia  04/11/2012  ? Orthostatic hypotension 04/11/2012  ? Osteoarthritis of both hips 04/11/2012  ? 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 Left sided sciatica 04/11/2012  ? History of colonic polyps 10/03/2011  ? ? ?REFERRING DIAG: Cervicalgia ? ?THERAPY DIAG:  ?Cervicalgia ? ?Neck stiffness ? ?Muscle weakness (generalized) ? ?Abnormal posture ? ?Difficulty in walking, not elsewhere classified ? ?PERTINENT HISTORY: OA, Cirrhosis, GERD, Hemophilia, Hepatitis C, Hypothyroidism ? ?PRECAUTIONS:  Fall ? ?SUBJECTIVE: Had some back pain that wrapped around his left side a day or so after last session. Better now. ? ?PAIN:  ?Are you having pain? No ?NPRS scale: 0/10 ?Pain location: cervical ?Pain orientation: Lower  ?PAIN TYPE: sore ?Pain description: intermittent  ?Aggravating factors: during cervical rotation ?Relieving factors: rest ? ? ?OBJECTIVE:  ?  ?DIAGNOSTIC FINDINGS:  ?No new imaging ?  ?PATIENT SURVEYS:  ?FOTO 46% (59% projected by visit 11) ?  ?  ?COGNITION: ?Overall cognitive status: Within functional limits for tasks assessed ?  ?POSTURE:  ?Forward head, kyphotic thoracic spine ?  ?CERVICAL AROM/PROM ?  ?A/PROM A/ROM (deg) ?12/22/2021 A/ROM (deg) ?01/05/2022  ?Flexion 45   ?Extension 10   ?Right lateral flexion 12   ?Left lateral flexion 10   ?Right rotation 40   ?Left rotation 40   ? (Blank rows = not tested) ?  ?UE MMT: ?Bilateral UE grossly 4/5 throughout ?  ?FUNCTIONAL TESTS:  ? ?01/18/2022: ?Timed up and go (TUG): 16.1 sec without assistive device ? ?12/22/2021: ?5 times sit to stand: 15.9 sec with UE use ?Timed up and go (TUG): 16 sec without assistive device ?  ?  ?  ?TODAY'S TREATMENT:  ? ? 01/21/22: ?Nustep L4x 9 min. PT present to montior and discuss current status ?Seated:  shoulder flexion, scaption, shoulder ER, horizontal abduction with red tband.  2x10 each bilat. Foam noodle at thoracic. ?Thoracic extension witting against ball 2x10: 2nd set hold 3 sec  ?Sitting cervical extension A/ROM 2x10; hold 3 sec  ?Standing:  shoulder rows with green tband 2x10 reps ?Sit to/from stand holding 5# kettlebell:  x10 with upright row and VC to stand tall, x10 with overhead press ?4# with BilUE to second shelf 2x5. Pt could not see the second shelf.     ? ?01/18/2022:  ?Nustep L5 x 8 min. PT present to montior and discuss current status ?Seated:  shoulder flexion, scaption, shoulder ER, horizontal abduction with red tband.  2x10 each bilat. ?Thoracic extension witting against ball 2x10 ?Sitting cervical  extension A/ROM 2x10 ?Standing:  shoulder rows and extension with red tband 2x10 reps ?Sit to/from stand holding 5# kettlebell:  x10 with chest press, x10 with overhead press ?  ?01/12/2022: ?Nustep L5 x 8 min. PT present to montior and discuss current status ?Seated:  shoulder flexion, scaption, shoulder ER, horizontal abduction with red tband.  2x10 each bilat. ?Standing:  shoulder rows and extension with red tband 2x10 reps ?Sitting cervical extension A/ROM 2x10 ?Sit to/from stand holding 5# kettlebell:  x10 with chest press, x10 with overhead press ? ?01/10/22:  ?Nustep L5 x 8 min; 30 sec look up/30 sec rest. PTA present to montior and discuss current status ?Seated Chin tucks into ball with 5 sec hold 2x10  ?Half small noodle at upper thoracic: thoracic & cervical ext 2x12 ?Seated:  shoulder flexion, scaption, shoulder ER, horizontal abduction with red tband.  2x10 each bilat. ?Reaching up to first shelf of cabinet with 2# 2x10 bilat, x10 reaching up to 2nd shelf bilat. ?3 way scapular  stabilization x5 bilat with green loop ?Wall push ups 2x12 ? ? ?  ?PATIENT EDUCATION:  ?Education details: Pt educated in posture. ?Person educated: Patient ?Education method: Explanation ?Education comprehension: verbalized understanding ?  ?  ?HOME EXERCISE PROGRAM: ?Access Code: [email or web address] ?URL: [email or web address] ?Date: 12/29/2021 ?Prepared by: Juel Burrow ? ?Exercises ?Supine Cervical Retraction with Towel - 1-2 x daily - 7 x weekly - 2 sets - 10 reps - 2 sec hold ?Seated Scapular Retraction - 2 x daily - 7 x weekly - 2 sets - 10 reps ?Seated Cervical Rotation AROM - 2 x daily - 7 x weekly - 2 sets - 10 reps ?Seated Cervical Extension AROM - 2 x daily - 7 x weekly - 2 sets - 10 reps ?Seated Overhead Reach Stretch - 2 x daily - 7 x weekly - 2 sets - 10 reps ?Shoulder External Rotation and Scapular Retraction with Resistance - 1-2 x daily - 7 x weekly - 2 sets - 10 reps ?Standing Shoulder Horizontal Abduction  with Resistance - 1-2 x daily - 7 x weekly - 2 sets - 10 reps ?Standing Shoulder Single Arm PNF D2 Flexion with Resistance - 1-2 x daily - 7 x weekly - 1 sets - 10 reps ?Seated Thoracic Extension with Hands Behind Neck -

## 2022-01-24 ENCOUNTER — Encounter: Payer: Self-pay | Admitting: Physical Therapy

## 2022-01-24 ENCOUNTER — Ambulatory Visit: Payer: Medicare Other | Attending: Nurse Practitioner | Admitting: Physical Therapy

## 2022-01-24 DIAGNOSIS — M25551 Pain in right hip: Secondary | ICD-10-CM | POA: Insufficient documentation

## 2022-01-24 DIAGNOSIS — R293 Abnormal posture: Secondary | ICD-10-CM | POA: Diagnosis present

## 2022-01-24 DIAGNOSIS — M6281 Muscle weakness (generalized): Secondary | ICD-10-CM | POA: Insufficient documentation

## 2022-01-24 DIAGNOSIS — R262 Difficulty in walking, not elsewhere classified: Secondary | ICD-10-CM | POA: Diagnosis present

## 2022-01-24 DIAGNOSIS — M542 Cervicalgia: Secondary | ICD-10-CM | POA: Insufficient documentation

## 2022-01-24 DIAGNOSIS — M436 Torticollis: Secondary | ICD-10-CM | POA: Diagnosis present

## 2022-01-24 NOTE — Therapy (Addendum)
?OUTPATIENT PHYSICAL THERAPY TREATMENT NOTE ? ? ?Patient Name: Justin Hampton ?MRN: [id number] ?DOB:11-Oct-1946, 76 y.o., male ?Today's Date: 01/24/2022 ? ?PCP: Bartholome Bill, MD ?REFERRING PROVIDER: Eldridge Abrahams, NP ? ?Progress Note ?Reporting Period 12/22/2021 to 01/24/2022 ? ?See note below for Objective Data and Assessment of Progress/Goals.  ? ? ? ? ? PT End of Session - 01/24/22 1102   ? ? Visit Number 10   ? Date for PT Re-Evaluation 02/11/22   ? Authorization Type UHC   ? PT Start Time 1100   ? PT Stop Time 1139   ? PT Time Calculation (min) 39 min   ? Activity Tolerance Patient tolerated treatment well   ? Behavior During Therapy [facility] for tasks assessed/performed   ? ?  ?  ? ?  ? ? ? ? ? ?Past Medical History:  ?Diagnosis Date  ? Adenomatous colon polyp   ? Arthritis   ? fingers  ? 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 Cirrhosis ([place])   ? Diverticulosis   ? GERD (gastroesophageal reflux disease)   ? 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 Hepatic encephalopathy ([place]) 04/12/2012  ? Hepatitis C   ? treated  ? Hypoglycemia   ? Hypothyroidism   ? ?Past Surgical History:  ?Procedure Laterality Date  ? APPENDECTOMY    ? CARPAL TUNNEL RELEASE Bilateral   ? COLONOSCOPY    ? ESOPHAGOGASTRODUODENOSCOPY    ? x 2  ? ESOPHAGOGASTRODUODENOSCOPY (EGD) WITH PROPOFOL N/A 10/29/2018  ? Procedure: ESOPHAGOGASTRODUODENOSCOPY (EGD) WITH PROPOFOL;  Surgeon: Rush Landmark [name], MD;  Location: [place];  Service: Gastroenterology;  Laterality: N/A;  ? EUS N/A 10/29/2018  ? Procedure: UPPER ENDOSCOPIC ULTRASOUND (EUS) RADIAL;  Surgeon: Rush Landmark [name], MD;  Location: [place];  Service: Gastroenterology;  Laterality: N/A;  ? ?Patient Active Problem List  ? Diagnosis Date Noted  ? Hypotension 04/01/2021  ? Lactic acidosis 04/01/2021  ? Loose stools 04/01/2021  ? AKI (acute kidney injury) ([place]) 04/01/2021  ? Syncope 04/01/2021  ? Near syncope 03/31/2021  ? Hypoglycemia 10/31/2018  ? Impaired  functional mobility, balance, gait, and endurance 10/31/2018  ? Dilation of pancreatic duct 10/02/2018  ? 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  ? 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 History of encephalopathy 09/28/2018  ? Vascular dementia without behavioral disturbance ([place])   ? History of hepatitis C   ? Hypothyroidism (acquired)   ? Gastroesophageal reflux disease   ? Aneurysm, cerebral, nonruptured   ? AMS (altered mental status) 08/17/2018  ? Gait disturbance 08/02/2018  ? Mild memory disturbance 08/02/2018  ? Hepatic encephalopathy ([place]) 07/13/2018  ? 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 CKD (chronic kidney disease), stage III ([place]) 07/13/2018  ? Cervical stenosis of spine 05/29/2018  ? Degenerative disc disease, cervical 05/29/2018  ? Iron deficiency anemia due to chronic blood loss 09/12/2017  ? Rectal bleeding 06/06/2017  ? Acute renal failure (ARF) ([place]) 06/06/2017  ? Hyponatremia 06/06/2017  ? Hyperkalemia 06/06/2017  ? Anemia 06/06/2017  ? GI bleeding 06/06/2017  ? 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 Other dental procedure status 05/22/2017  ? Family history of CHF (congestive heart failure) 2017-01-25  ? Family history of sudden death 2017-01-25  ? Dyspnea Jul 13, 2016  ? Other disorder of circulatory system 04/12/2012  ? Phlebolithiasis 04/12/2012  ? Abnormal prostate by palpation 04/11/2012  ? Benign neoplasm of colon 04/11/2012  ? Degenerative joint  disease of pelvic region 04/11/2012  ? Disorder of prostate 04/11/2012  ? Iron excess 04/11/2012  ? Lumbosacral spondylosis without myelopathy 04/11/2012  ? Nocturia 04/11/2012  ? Orthostatic hypotension 04/11/2012  ? Osteoarthritis of both hips 04/11/2012  ? 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 Left sided sciatica 04/11/2012  ? History of colonic polyps 10/03/2011  ? ? ?REFERRING DIAG: Cervicalgia ? ?THERAPY DIAG:  ?Cervicalgia ? ?Neck stiffness ? ?Muscle weakness (generalized) ? ?Abnormal posture ? ?Difficulty  in walking, not elsewhere classified ? ?PERTINENT HISTORY: OA, Cirrhosis, GERD, Hemophilia, Hepatitis C, Hypothyroidism ? ?PRECAUTIONS: Fall ? ?SUBJECTIVE: LT lower back has good days and bad. ?PAIN:  ?Are you having pain? No ?NPRS scale: 0/10 ?Pain location: cervical ?Pain orientation: Lower  ?PAIN TYPE: sore ?Pain description: intermittent  ?Aggravating factors: during cervical rotation ?Relieving factors: rest ? ? ?OBJECTIVE:  ?  ?DIAGNOSTIC FINDINGS:  ?No new imaging ?  ?PATIENT SURVEYS:  ?FOTO 46% (59% projected by visit 11) ?  FOTO on 01/24/2022:  49% ?  ?  ?COGNITION: ?Overall cognitive status: Within functional limits for tasks assessed ?  ?POSTURE:  ?Forward head, kyphotic thoracic spine ?  ?CERVICAL AROM/PROM ?  ?A/PROM A/ROM (deg) ?12/22/2021 A/ROM (deg) ?01/05/2022 A/ROM ?01/24/22  ?Flexion 45    ?Extension 10  10  ?Right lateral flexion 12    ?Left lateral flexion 10    ?Right rotation 40  30  ?Left rotation 40  30  ? (Blank rows = not tested) ?  ?UE MMT: ?Bilateral UE grossly 4/5 throughout ?  ?FUNCTIONAL TESTS:  ? ?01/24/2022: ?Timed up and go (TUG): 16.1 sec without assistive device ? ?01/18/2022: ?Timed up and go (TUG): 16.1 sec without assistive device ? ?12/22/2021: ?5 times sit to stand: 15.9 sec with UE use ?Timed up and go (TUG): 16 sec without assistive device ?  ?  ?  ?TODAY'S TREATMENT:  ?  ?01/24/22: ?Arm bike L1 2 min each direction ?Cervical ROM: see chart ?Nustep L4x 9 min. PT present to montior and discuss current status ?Seated:  shoulder flexion, scaption, shoulder ER, horizontal abduction with red tband.  2x10 each bilat. Foam noodle at thoracic. ?Thoracic extension witting against ball 2x10: 2nd set hold 3 sec  ?Sitting cervical extension A/ROM 2x10; hold 3 sec  ?Standing:  shoulder rows with green tband 2x10 reps ?Sit to/from stand holding 5# kettlebell:  x10 with upright row and VC to stand tall, x10 with overhead press ?Bilateral UE grossly 4/5 throughout ?FOTO: 49 ?Timed up and go (TUG): 16.1  sec without assistive device  ? ? ?  ? ? 01/21/22: ?Nustep L4x 9 min. PT present to montior and discuss current status ?Seated:  shoulder flexion, scaption, shoulder ER, horizontal abduction with red tband.  2x10 each bilat. Foam noodle at thoracic. ?Thoracic extension witting against ball 2x10: 2nd set hold 3 sec  ?Sitting cervical extension A/ROM 2x10; hold 3 sec  ?Standing:  shoulder rows with green tband 2x10 reps ?Sit to/from stand holding 5# kettlebell:  x10 with upright row and VC to stand tall, x10 with overhead press ?4# with BilUE to second shelf 2x5. Pt could not see the second shelf.     ? ?01/18/2022:  ?Nustep L5 x 8 min. PT present to montior and discuss current status ?Seated:  shoulder flexion, scaption, shoulder ER, horizontal abduction with red tband.  2x10 each bilat. ?Thoracic extension witting against ball 2x10 ?Sitting cervical extension A/ROM 2x10 ?Standing:  shoulder rows and extension with red tband 2x10 reps ?  Sit to/from stand holding 5# kettlebell:  x10 with chest press, x10 with overhead press ?  ?01/12/2022: ?Nustep L5 x 8 min. PT present to montior and discuss current status ?Seated:  shoulder flexion, scaption, shoulder ER, horizontal abduction with red tband.  2x10 each bilat. ?Standing:  shoulder rows and extension with red tband 2x10 reps ?Sitting cervical extension A/ROM 2x10 ?Sit to/from stand holding 5# kettlebell:  x10 with chest press, x10 with overhead press ? ? ?  ?PATIENT EDUCATION:  ?Education details: Pt educated in posture. ?Person educated: Patient ?Education method: Explanation ?Education comprehension: verbalized understanding ?  ?  ?HOME EXERCISE PROGRAM: ?Access Code: [email or web address] ?URL: [email or web address] ?Date: 12/29/2021 ?Prepared by: Juel Burrow ? ?Exercises ?Supine Cervical Retraction with Towel - 1-2 x daily - 7 x weekly - 2 sets - 10 reps - 2 sec hold ?Seated Scapular Retraction - 2 x daily - 7 x weekly - 2 sets - 10 reps ?Seated Cervical Rotation  AROM - 2 x daily - 7 x weekly - 2 sets - 10 reps ?Seated Cervical Extension AROM - 2 x daily - 7 x weekly - 2 sets - 10 reps ?Seated Overhead Reach Stretch - 2 x daily - 7 x weekly - 2 sets - 10 reps ?S

## 2022-01-26 ENCOUNTER — Encounter: Payer: Medicare Other | Admitting: Rehabilitative and Restorative Service Providers"

## 2022-01-27 NOTE — Therapy (Signed)
?OUTPATIENT PHYSICAL THERAPY TREATMENT NOTE ? ? ?Patient Name: Justin Hampton ?MRN: [id number] ?DOB:May 16, 1946, 76 y.o., male ?Today's Date: 01/28/2022 ? ?PCP: Bartholome Bill, MD ?REFERRING PROVIDER: Eldridge Abrahams, NP ? ?Progress Note ?Reporting Period 12/22/2021 to 01/24/2022 ? ?See note below for Objective Data and Assessment of Progress/Goals.  ? ? ? ? ? PT End of Session - 01/28/22 1107   ? ? Visit Number 11   ? Date for PT Re-Evaluation 02/11/22   ? Authorization Type UHC   ? PT Start Time 1104   ? PT Stop Time 0737   ? PT Time Calculation (min) 38 min   ? Activity Tolerance Patient tolerated treatment well   ? Behavior During Therapy [facility] for tasks assessed/performed   ? ?  ?  ? ?  ? ? ? ? ? ? ?Past Medical History:  ?Diagnosis Date  ? Adenomatous colon polyp   ? Arthritis   ? fingers  ? 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 Cirrhosis ([place])   ? Diverticulosis   ? GERD (gastroesophageal reflux disease)   ? 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 Hepatic encephalopathy ([place]) 04/12/2012  ? Hepatitis C   ? treated  ? Hypoglycemia   ? Hypothyroidism   ? ?Past Surgical History:  ?Procedure Laterality Date  ? APPENDECTOMY    ? CARPAL TUNNEL RELEASE Bilateral   ? COLONOSCOPY    ? ESOPHAGOGASTRODUODENOSCOPY    ? x 2  ? ESOPHAGOGASTRODUODENOSCOPY (EGD) WITH PROPOFOL N/A 10/29/2018  ? Procedure: ESOPHAGOGASTRODUODENOSCOPY (EGD) WITH PROPOFOL;  Surgeon: Rush Landmark [name], MD;  Location: [place];  Service: Gastroenterology;  Laterality: N/A;  ? EUS N/A 10/29/2018  ? Procedure: UPPER ENDOSCOPIC ULTRASOUND (EUS) RADIAL;  Surgeon: Rush Landmark [name], MD;  Location: [place];  Service: Gastroenterology;  Laterality: N/A;  ? ?Patient Active Problem List  ? Diagnosis Date Noted  ? Hypotension 04/01/2021  ? Lactic acidosis 04/01/2021  ? Loose stools 04/01/2021  ? AKI (acute kidney injury) ([place]) 04/01/2021  ? Syncope 04/01/2021  ? Near syncope 03/31/2021  ? Hypoglycemia 10/31/2018  ? Impaired  functional mobility, balance, gait, and endurance 10/31/2018  ? Dilation of pancreatic duct 10/02/2018  ? 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  ? 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 History of encephalopathy 09/28/2018  ? Vascular dementia without behavioral disturbance ([place])   ? History of hepatitis C   ? Hypothyroidism (acquired)   ? Gastroesophageal reflux disease   ? Aneurysm, cerebral, nonruptured   ? AMS (altered mental status) 08/17/2018  ? Gait disturbance 08/02/2018  ? Mild memory disturbance 08/02/2018  ? Hepatic encephalopathy ([place]) 07/13/2018  ? 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 CKD (chronic kidney disease), stage III ([place]) 07/13/2018  ? Cervical stenosis of spine 05/29/2018  ? Degenerative disc disease, cervical 05/29/2018  ? Iron deficiency anemia due to chronic blood loss 09/12/2017  ? Rectal bleeding 06/06/2017  ? Acute renal failure (ARF) ([place]) 06/06/2017  ? Hyponatremia 06/06/2017  ? Hyperkalemia 06/06/2017  ? Anemia 06/06/2017  ? GI bleeding 06/06/2017  ? 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 Other dental procedure status 05/22/2017  ? Family history of CHF (congestive heart failure) January 27, 2017  ? Family history of sudden death 2017-01-27  ? Dyspnea 15-Jul-2016  ? Other disorder of circulatory system 04/12/2012  ? Phlebolithiasis 04/12/2012  ? Abnormal prostate by palpation 04/11/2012  ? Benign neoplasm of colon 04/11/2012  ? Degenerative  joint disease of pelvic region 04/11/2012  ? Disorder of prostate 04/11/2012  ? Iron excess 04/11/2012  ? Lumbosacral spondylosis without myelopathy 04/11/2012  ? Nocturia 04/11/2012  ? Orthostatic hypotension 04/11/2012  ? Osteoarthritis of both hips 04/11/2012  ? 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 Left sided sciatica 04/11/2012  ? History of colonic polyps 10/03/2011  ? ? ?REFERRING DIAG: Cervicalgia ? ?THERAPY DIAG:  ?Cervicalgia ? ?Neck stiffness ? ?Muscle weakness (generalized) ? ?Abnormal posture ? ?Difficulty  in walking, not elsewhere classified ? ?PERTINENT HISTORY: OA, Cirrhosis, GERD, Hemophilia, Hepatitis C, Hypothyroidism ? ?PRECAUTIONS: Fall ? ?SUBJECTIVE: I am very pleased with what I can do now around the house due to improved mobility of my neck. My back continues to get better.  ? ?PAIN:  ?Are you having pain? No ?NPRS scale: 0/10 ?Pain location: cervical ?Pain orientation: Lower  ?PAIN TYPE: sore ?Pain description: intermittent  ?Aggravating factors: during cervical rotation ?Relieving factors: rest ? ? ?OBJECTIVE:  ?  ?DIAGNOSTIC FINDINGS:  ?No new imaging ?  ?PATIENT SURVEYS:  ?FOTO 46% (59% projected by visit 11) ?  FOTO on 01/24/2022:  49% ?  ?  ?COGNITION: ?Overall cognitive status: Within functional limits for tasks assessed ?  ?POSTURE:  ?Forward head, kyphotic thoracic spine ?  ?CERVICAL AROM/PROM ?  ?A/PROM A/ROM (deg) ?12/22/2021 A/ROM (deg) ?01/05/2022 A/ROM ?01/24/22  ?Flexion 45    ?Extension 10  10  ?Right lateral flexion 12    ?Left lateral flexion 10    ?Right rotation 40  30  ?Left rotation 40  30  ? (Blank rows = not tested) ?  ?UE MMT: ?Bilateral UE grossly 4/5 throughout ?  ?FUNCTIONAL TEST ? ?01/24/2022: ?Timed up and go (TUG): 16.1 sec without assistive device ? ?01/18/2022: ?Timed up and go (TUG): 16.1 sec without assistive device ? ?12/22/2021: ?5 times sit to stand: 15.9 sec with UE use ?Timed up and go (TUG): 16 sec without assistive device ?  ?  ?  ?TODAY'S TREATMENT:  ? 01/28/22: ?Arm bike L1 2 min each direction ?Nustep L4x 10 min. PT present to montior and discuss current status ?Seated:  shoulder flexion, scaption, shoulder ER, horizontal abduction with red tband.  2x10 each bilat. Pink ball at thoracic. ?Thoracic extension witting against ball 2x10: 2nd set hold 3 sec  ?Sitting cervical extension A/ROM 2x10; hold 3 sec  ?Standing: shoulder rows with green tband 2x10 reps ?Sit to/from stand holding 5# kettlebell:  2x10 with upright row and VC to stand tall, x10 with overhead  press ?  ?01/24/22: ?Arm bike L1 2 min each direction ?Cervical ROM: see chart ?Nustep L4x 9 min. PT present to montior and discuss current status ?Seated:  shoulder flexion, scaption, shoulder ER, horizontal abduction with red tband.  2x10 each bilat. Pink ball at thoracic. ?Thoracic extension witting against ball 2x10: 2nd set hold 3 sec  ?Sitting cervical extension A/ROM 2x10; hold 3 sec  ?Standing:  shoulder rows with green tband 2x10 reps ?Sit to/from stand holding 5# kettlebell:  x10 with upright row and VC to stand tall, x10 with overhead press ?Bilateral UE grossly 4/5 throughout ?FOTO: 49 ?Timed up and go (TUG): 16.1 sec without assistive device  ? ? ?  ? ? 01/21/22: ?Nustep L4x 9 min. PT present to montior and discuss current status ?Seated:  shoulder flexion, scaption, shoulder ER, horizontal abduction with red tband.  2x10 each bilat. Foam noodle at thoracic. ?Thoracic extension witting against ball 2x10: 2nd set hold 3 sec  ?  Sitting cervical extension A/ROM 2x10; hold 3 sec  ?Standing:  shoulder rows with green tband 2x10 reps ?Sit to/from stand holding 5# kettlebell:  x10 with upright row and VC to stand tall, x10 with overhead press ?4# with BilUE to second shelf 2x5. Pt could not see the second shelf.     ? ?01/18/2022:  ?Nustep L5 x 8 min. PT present to montior and discuss current status ?Seated:  shoulder flexion, scaption, shoulder ER, horizontal abduction with red tband.  2x10 each bilat. ?Thoracic extension witting against ball 2x10 ?Sitting cervical extension A/ROM 2x10 ?Standing:  shoulder rows and extension with red tband 2x10 reps ?Sit to/from stand holding 5# kettlebell:  x10 with chest press, x10 with overhead press ?  ?01/12/2022: ?Nustep L5 x 8 min. PT present to montior and discuss current status ?Seated:  shoulder flexion, scaption, shoulder ER, horizontal abduction with red tband.  2x10 each bilat. ?Standing:  shoulder rows and extension with red tband 2x10 reps ?Sitting cervical extension  A/ROM 2x10 ?Sit to/from stand holding 5# kettlebell:  x10 with chest press, x10 with overhead press ? ? ?  ?PATIENT EDUCATION:  ?Education details: Pt educated in posture. ?Person educated: Patient ?Education method:

## 2022-01-28 ENCOUNTER — Ambulatory Visit: Payer: Medicare Other | Admitting: Physical Therapy

## 2022-01-28 ENCOUNTER — Encounter: Payer: Self-pay | Admitting: Physical Therapy

## 2022-01-28 DIAGNOSIS — M436 Torticollis: Secondary | ICD-10-CM

## 2022-01-28 DIAGNOSIS — M542 Cervicalgia: Secondary | ICD-10-CM | POA: Diagnosis not present

## 2022-01-28 DIAGNOSIS — R262 Difficulty in walking, not elsewhere classified: Secondary | ICD-10-CM

## 2022-01-28 DIAGNOSIS — R293 Abnormal posture: Secondary | ICD-10-CM

## 2022-01-28 DIAGNOSIS — M6281 Muscle weakness (generalized): Secondary | ICD-10-CM

## 2022-01-30 NOTE — Therapy (Signed)
?OUTPATIENT PHYSICAL THERAPY TREATMENT NOTE ? ? ?Patient Name: Justin Hampton ?MRN: [id number] ?DOB:[birth year], 76 y.o., male ?Today's Date: 01/31/2022 ? ?PCP: Bartholome Bill, MD ?REFERRING PROVIDER: Eldridge Abrahams, NP ? ?Progress Note ?Reporting Period 12/22/2021 to 01/24/2022 ? ?See note below for Objective Data and Assessment of Progress/Goals.  ? ? ? ? ? PT End of Session - 01/31/22 1059   ? ? Visit Number 12   ? Date for PT Re-Evaluation 02/11/22   ? Authorization Type UHC   ? PT Start Time 1059   ? PT Stop Time 1137   ? PT Time Calculation (min) 38 min   ? Activity Tolerance Patient tolerated treatment well   ? Behavior During Therapy [facility] for tasks assessed/performed   ? ?  ?  ? ?  ? ? ? ? ? ? ? ?Past Medical History:  ?Diagnosis Date  ? Adenomatous colon polyp   ? Arthritis   ? fingers  ? 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 Cirrhosis ([place])   ? Diverticulosis   ? GERD (gastroesophageal reflux disease)   ? 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 Hepatic encephalopathy ([place]) 04/12/2012  ? Hepatitis C   ? treated  ? Hypoglycemia   ? Hypothyroidism   ? ?Past Surgical History:  ?Procedure Laterality Date  ? APPENDECTOMY    ? CARPAL TUNNEL RELEASE Bilateral   ? COLONOSCOPY    ? ESOPHAGOGASTRODUODENOSCOPY    ? x 2  ? ESOPHAGOGASTRODUODENOSCOPY (EGD) WITH PROPOFOL N/A 10/29/2018  ? Procedure: ESOPHAGOGASTRODUODENOSCOPY (EGD) WITH PROPOFOL;  Surgeon: Rush Landmark [name], MD;  Location: [place];  Service: Gastroenterology;  Laterality: N/A;  ? EUS N/A 10/29/2018  ? Procedure: UPPER ENDOSCOPIC ULTRASOUND (EUS) RADIAL;  Surgeon: Rush Landmark [name], MD;  Location: [place];  Service: Gastroenterology;  Laterality: N/A;  ? ?Patient Active Problem List  ? Diagnosis Date Noted  ? Hypotension 04/01/2021  ? Lactic acidosis 04/01/2021  ? Loose stools 04/01/2021  ? AKI (acute kidney injury) ([place]) 04/01/2021  ? Syncope 04/01/2021  ? Near syncope 03/31/2021  ? Hypoglycemia 10/31/2018  ?  Impaired functional mobility, balance, gait, and endurance 10/31/2018  ? Dilation of pancreatic duct 10/02/2018  ? 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  ? 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 History of encephalopathy 09/28/2018  ? Vascular dementia without behavioral disturbance ([place])   ? History of hepatitis C   ? Hypothyroidism (acquired)   ? Gastroesophageal reflux disease   ? Aneurysm, cerebral, nonruptured   ? AMS (altered mental status) 08/17/2018  ? Gait disturbance 08/02/2018  ? Mild memory disturbance 08/02/2018  ? Hepatic encephalopathy ([place]) 07/13/2018  ? 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 CKD (chronic kidney disease), stage III ([place]) 07/13/2018  ? Cervical stenosis of spine 05/29/2018  ? Degenerative disc disease, cervical 05/29/2018  ? Iron deficiency anemia due to chronic blood loss 09/12/2017  ? Rectal bleeding 06/06/2017  ? Acute renal failure (ARF) ([place]) 06/06/2017  ? Hyponatremia 06/06/2017  ? Hyperkalemia 06/06/2017  ? Anemia 06/06/2017  ? GI bleeding 06/06/2017  ? 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 Other dental procedure status 05/22/2017  ? Family history of CHF (congestive heart failure) February 11, 2017  ? Family history of sudden death 2017/02/11  ? Dyspnea 30-Jul-2016  ? Other disorder of circulatory system 04/12/2012  ? Phlebolithiasis 04/12/2012  ? Abnormal prostate by palpation 04/11/2012  ? Benign neoplasm of colon 04/11/2012  ?  Degenerative joint disease of pelvic region 04/11/2012  ? Disorder of prostate 04/11/2012  ? Iron excess 04/11/2012  ? Lumbosacral spondylosis without myelopathy 04/11/2012  ? Nocturia 04/11/2012  ? Orthostatic hypotension 04/11/2012  ? Osteoarthritis of both hips 04/11/2012  ? 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 Left sided sciatica 04/11/2012  ? History of colonic polyps 10/03/2011  ? ? ?REFERRING DIAG: Cervicalgia ? ?THERAPY DIAG:  ?Cervicalgia ? ?Neck stiffness ? ?Muscle weakness (generalized) ? ?Abnormal  posture ? ?Difficulty in walking, not elsewhere classified ? ?PERTINENT HISTORY: OA, Cirrhosis, GERD, Hemophilia, Hepatitis C, Hypothyroidism ? ?PRECAUTIONS: Fall ? ?SUBJECTIVE: I won't be here Wednesday bc that is my doctor appt day.  ?PAIN:  ?Are you having pain? No ?NPRS scale: 0/10 ?Pain location: cervical ?Pain orientation: Lower  ?PAIN TYPE: sore ?Pain description: intermittent  ?Aggravating factors: during cervical rotation ?Relieving factors: rest ? ? ?OBJECTIVE:  ?  ?DIAGNOSTIC FINDINGS:  ?No new imaging ?  ?PATIENT SURVEYS:  ?FOTO 46% (59% projected by visit 11) ?  FOTO on 01/24/2022:  49% ?  ?  ?COGNITION: ?Overall cognitive status: Within functional limits for tasks assessed ?  ?POSTURE:  ?Forward head, kyphotic thoracic spine ?  ?CERVICAL AROM/PROM ?  ?A/PROM A/ROM (deg) ?12/22/2021 A/ROM (deg) ?01/05/2022 A/ROM ?01/24/22  ?Flexion 45    ?Extension 10  10  ?Right lateral flexion 12    ?Left lateral flexion 10    ?Right rotation 40  30  ?Left rotation 40  30  ? (Blank rows = not tested) ?  ?UE MMT: ?Bilateral UE grossly 4/5 throughout ?  ?FUNCTIONAL TEST ? ?01/24/2022: ?Timed up and go (TUG): 16.1 sec without assistive device ? ?01/18/2022: ?Timed up and go (TUG): 16.1 sec without assistive device ? ?12/22/2021: ?5 times sit to stand: 15.9 sec with UE use ?Timed up and go (TUG): 16 sec without assistive device ?  ?  ?  ?TODAY'S TREATMENT:  ? 01/31/22: ?Arm bike L1.5  2 min each direction PTA present to discuss status ?Seated:  shoulder flexion, scaption, shoulder ER, horizontal abduction with green tband.  x10 each bilat. Pink ball at thoracic. ?Sit to/from stand holding 5# kettlebell:  x10 with upright row and VC to stand tall ?Blue band standing rows 2x10 ?Sitting cervical extension A/ROM 2x10; hold 3 sec  ?Nustep L4x 10 min. PTA monitoring for SOB ? ?01/28/22: ?Arm bike L1 2 min each direction ?Nustep L4x 10 min. PT present to montior and discuss current status ?Seated:  shoulder flexion, scaption, shoulder ER,  horizontal abduction with red tband.  2x10 each bilat. Pink ball at thoracic. ?Thoracic extension witting against ball 2x10: 2nd set hold 3 sec  ?Sitting cervical extension A/ROM 2x10; hold 3 sec  ?Standing: shoulder rows with green tband 2x10 reps ?Sit to/from stand holding 5# kettlebell:  2x10 with upright row and VC to stand tall, x10 with overhead press ?  ?01/24/22: ?Arm bike L1 2 min each direction ?Cervical ROM: see chart ?Nustep L4x 9 min. PT present to montior and discuss current status ?Seated:  shoulder flexion, scaption, shoulder ER, horizontal abduction with red tband.  2x10 each bilat. Pink ball at thoracic. ?Thoracic extension witting against ball 2x10: 2nd set hold 3 sec  ?Sitting cervical extension A/ROM 2x10; hold 3 sec  ?Standing:  shoulder rows with green tband 2x10 reps ?Sit to/from stand holding 5# kettlebell:  x10 with upright row and VC to stand tall, x10 with overhead press ?Bilateral UE grossly 4/5 throughout ?FOTO: 49 ?Timed up and  go (TUG): 16.1 sec without assistive device  ? ? ?  ? ? 01/21/22: ?Nustep L4x 9 min. PT present to montior and discuss current status ?Seated:  shoulder flexion, scaption, shoulder ER, horizontal abduction with red tband.  2x10 each bilat. Foam noodle at thoracic. ?Thoracic extension witting against ball 2x10: 2nd set hold 3 sec  ?Sitting cervical extension A/ROM 2x10; hold 3 sec  ?Standing:  shoulder rows with green tband 2x10 reps ?Sit to/from stand holding 5# kettlebell:  x10 with upright row and VC to stand tall, x10 with overhead press ?4# with BilUE to second shelf 2x5. Pt could not see the second shelf.     ? ?PATIENT EDUCATION:  ?Education details: Pt educated in posture. ?Person educated: Patient ?Education method: Explanation ?Education comprehension: verbalized understanding ?  ?  ?HOME EXERCISE PROGRAM: ?Access Code: [email or web address] ?URL: [email or web address] ?Date: 12/29/2021 ?Prepared by: Juel Burrow ? ?Exercises ?Supine Cervical Retraction  with Towel - 1-2 x daily - 7 x weekly - 2 sets - 10 reps - 2 sec hold ?Seated Scapular Retraction - 2 x daily - 7 x weekly - 2 sets - 10 reps ?Seated Cervical Rotation AROM - 2 x daily - 7 x weekly - 2 sets - 10 reps ?Seate

## 2022-01-31 ENCOUNTER — Ambulatory Visit: Payer: Medicare Other | Admitting: Physical Therapy

## 2022-01-31 ENCOUNTER — Encounter: Payer: Self-pay | Admitting: Physical Therapy

## 2022-01-31 DIAGNOSIS — M436 Torticollis: Secondary | ICD-10-CM

## 2022-01-31 DIAGNOSIS — R262 Difficulty in walking, not elsewhere classified: Secondary | ICD-10-CM

## 2022-01-31 DIAGNOSIS — M542 Cervicalgia: Secondary | ICD-10-CM | POA: Diagnosis not present

## 2022-01-31 DIAGNOSIS — R293 Abnormal posture: Secondary | ICD-10-CM

## 2022-01-31 DIAGNOSIS — M6281 Muscle weakness (generalized): Secondary | ICD-10-CM

## 2022-02-02 ENCOUNTER — Ambulatory Visit: Payer: Medicare Other | Admitting: Physical Therapy

## 2022-02-07 ENCOUNTER — Encounter: Payer: Self-pay | Admitting: Rehabilitative and Restorative Service Providers"

## 2022-02-07 ENCOUNTER — Ambulatory Visit: Payer: Medicare Other | Admitting: Rehabilitative and Restorative Service Providers"

## 2022-02-07 DIAGNOSIS — M6281 Muscle weakness (generalized): Secondary | ICD-10-CM

## 2022-02-07 DIAGNOSIS — R293 Abnormal posture: Secondary | ICD-10-CM

## 2022-02-07 DIAGNOSIS — M436 Torticollis: Secondary | ICD-10-CM

## 2022-02-07 DIAGNOSIS — M542 Cervicalgia: Secondary | ICD-10-CM | POA: Diagnosis not present

## 2022-02-07 NOTE — Therapy (Signed)
?OUTPATIENT PHYSICAL THERAPY TREATMENT NOTE ? ? ?Patient Name: Justin Hampton ?MRN: [id number] ?DOB:March 28, 1946, 76 y.o., male ?Today's Date: 02/07/2022 ? ?PCP: Bartholome Bill, MD ?REFERRING PROVIDER: Eldridge Abrahams, NP ? ? ? PT End of Session - 02/07/22 1233   ? ? Visit Number 13   ? Date for PT Re-Evaluation 02/11/22   ? Authorization Type UHC   ? PT Start Time 1230   ? PT Stop Time 1310   ? PT Time Calculation (min) 40 min   ? Activity Tolerance Patient tolerated treatment well   ? Behavior During Therapy [facility] for tasks assessed/performed   ? ?  ?  ? ?  ? ? ? ? ? ? ? ?Past Medical History:  ?Diagnosis Date  ? Adenomatous colon polyp   ? Arthritis   ? fingers  ? 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 Cirrhosis ([place])   ? Diverticulosis   ? GERD (gastroesophageal reflux disease)   ? 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 Hepatic encephalopathy ([place]) 04/12/2012  ? Hepatitis C   ? treated  ? Hypoglycemia   ? Hypothyroidism   ? ?Past Surgical History:  ?Procedure Laterality Date  ? APPENDECTOMY    ? CARPAL TUNNEL RELEASE Bilateral   ? COLONOSCOPY    ? ESOPHAGOGASTRODUODENOSCOPY    ? x 2  ? ESOPHAGOGASTRODUODENOSCOPY (EGD) WITH PROPOFOL N/A 10/29/2018  ? Procedure: ESOPHAGOGASTRODUODENOSCOPY (EGD) WITH PROPOFOL;  Surgeon: Rush Landmark [name], MD;  Location: [place];  Service: Gastroenterology;  Laterality: N/A;  ? EUS N/A 10/29/2018  ? Procedure: UPPER ENDOSCOPIC ULTRASOUND (EUS) RADIAL;  Surgeon: Rush Landmark [name], MD;  Location: [place];  Service: Gastroenterology;  Laterality: N/A;  ? ?Patient Active Problem List  ? Diagnosis Date Noted  ? Hypotension 04/01/2021  ? Lactic acidosis 04/01/2021  ? Loose stools 04/01/2021  ? AKI (acute kidney injury) ([place]) 04/01/2021  ? Syncope 04/01/2021  ? Near syncope 03/31/2021  ? Hypoglycemia 10/31/2018  ? Impaired functional mobility, balance, gait, and endurance 10/31/2018  ? Dilation of pancreatic duct 10/02/2018  ? Hepatitis B core antibody  positive 09/28/2018  ? 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 History of encephalopathy 09/28/2018  ? Vascular dementia without behavioral disturbance ([place])   ? History of hepatitis C   ? Hypothyroidism (acquired)   ? Gastroesophageal reflux disease   ? Aneurysm, cerebral, nonruptured   ? AMS (altered mental status) 08/17/2018  ? Gait disturbance 08/02/2018  ? Mild memory disturbance 08/02/2018  ? Hepatic encephalopathy ([place]) 07/13/2018  ? 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 CKD (chronic kidney disease), stage III ([place]) 07/13/2018  ? Cervical stenosis of spine 05/29/2018  ? Degenerative disc disease, cervical 05/29/2018  ? Iron deficiency anemia due to chronic blood loss 09/12/2017  ? Rectal bleeding 06/06/2017  ? Acute renal failure (ARF) ([place]) 06/06/2017  ? Hyponatremia 06/06/2017  ? Hyperkalemia 06/06/2017  ? Anemia 06/06/2017  ? GI bleeding 06/06/2017  ? 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 Other dental procedure status 05/22/2017  ? Family history of CHF (congestive heart failure) 25-Jan-2017  ? Family history of sudden death Jan 25, 2017  ? Dyspnea 2016/07/13  ? Other disorder of circulatory system 04/12/2012  ? Phlebolithiasis 04/12/2012  ? Abnormal prostate by palpation 04/11/2012  ? Benign neoplasm of colon 04/11/2012  ? Degenerative joint disease of pelvic region 04/11/2012  ? Disorder of prostate 04/11/2012  ? Iron excess 04/11/2012  ? Lumbosacral spondylosis  without myelopathy 04/11/2012  ? Nocturia 04/11/2012  ? Orthostatic hypotension 04/11/2012  ? Osteoarthritis of both hips 04/11/2012  ? 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 Left sided sciatica 04/11/2012  ? History of colonic polyps 10/03/2011  ? ? ?REFERRING DIAG: Cervicalgia ? ?THERAPY DIAG:  ?Cervicalgia ? ?Neck stiffness ? ?Muscle weakness (generalized) ? ?Abnormal posture ? ?PERTINENT HISTORY: OA, Cirrhosis, GERD, Hemophilia, Hepatitis C, Hypothyroidism ? ?PRECAUTIONS: Fall ? ?SUBJECTIVE: I fell last Thursday while  stepping off a curve and hurt my buttocks. ? ?PAIN:  ?Are you having pain? Yes ?NPRS scale: 6/10 ?Pain location: buttocks ?Pain orientation: Lower  ?PAIN TYPE: sore ?Pain description: intermittent  ?Aggravating factors: during cervical rotation ?Relieving factors: rest ? ? ?OBJECTIVE:  ?  ?DIAGNOSTIC FINDINGS:  ?No new imaging ?  ?PATIENT SURVEYS:  ?FOTO 46% (59% projected by visit 11) ?  FOTO on 01/24/2022:  49% ?  ?  ?COGNITION: ?Overall cognitive status: Within functional limits for tasks assessed ?  ?POSTURE:  ?Forward head, kyphotic thoracic spine ?  ?CERVICAL AROM/PROM ?  ?A/PROM A/ROM (deg) ?12/22/2021 A/ROM (deg) ?01/05/2022 A/ROM ?01/24/22  ?Flexion 45    ?Extension 10  10  ?Right lateral flexion 12    ?Left lateral flexion 10    ?Right rotation 40  30  ?Left rotation 40  30  ? (Blank rows = not tested) ?  ?UE MMT: ?Bilateral UE grossly 4/5 throughout ?  ?FUNCTIONAL TEST ? ?01/24/2022: ?Timed up and go (TUG): 16.1 sec without assistive device ? ?01/18/2022: ?Timed up and go (TUG): 16.1 sec without assistive device ? ?12/22/2021: ?5 times sit to stand: 15.9 sec with UE use ?Timed up and go (TUG): 16 sec without assistive device ?  ?  ?  ?TODAY'S TREATMENT:  ?  ?02/07/2022: ?Nustep L5x  min. PT present to discuss status ?Seated:  shoulder flexion, scaption, shoulder ER, horizontal abduction with red tband.  2x10 each bilat. Pink ball at thoracic. ?Seated rows and shoulder extension 2x10 with blue theraband ?Sitting cervical extension A/ROM 2x10; hold 3 sec ?Thoracic extension witting against ball 2x10: 2nd set hold 3 sec  ?Wall push ups 2x10 ?UBE level 1.0 x3 min each way.  PT present monitoring dyspnea. ? ?01/31/22: ?Arm bike L1.5  2 min each direction PTA present to discuss status ?Seated:  shoulder flexion, scaption, shoulder ER, horizontal abduction with green tband.  x10 each bilat. Pink ball at thoracic. ?Sit to/from stand holding 5# kettlebell:  x10 with upright row and VC to stand tall ?Blue band standing rows  2x10 ?Sitting cervical extension A/ROM 2x10; hold 3 sec  ?Nustep L4x 10 min. PTA monitoring for SOB ? ?01/28/22: ?Arm bike L1 2 min each direction ?Nustep L4x 10 min. PT present to montior and discuss current status ?Seated:  shoulder flexion, scaption, shoulder ER, horizontal abduction with red tband.  2x10 each bilat. Pink ball at thoracic. ?Thoracic extension witting against ball 2x10: 2nd set hold 3 sec  ?Sitting cervical extension A/ROM 2x10; hold 3 sec  ?Standing: shoulder rows with green tband 2x10 reps ?Sit to/from stand holding 5# kettlebell:  2x10 with upright row and VC to stand tall, x10 with overhead press ?  ? ?PATIENT EDUCATION:  ?Education details: Pt educated in posture. ?Person educated: Patient ?Education method: Explanation ?Education comprehension: verbalized understanding ?  ?  ?HOME EXERCISE PROGRAM: ?Access Code: [email or web address] ?URL: [email or web address] ?Date: 12/29/2021 ?Prepared by: Juel Burrow ? ?Exercises ?Supine Cervical Retraction with Towel - 1-2 x daily - 7 x weekly - 2  sets - 10 reps - 2 sec hold ?Seated Scapular Retraction - 2 x daily - 7 x weekly - 2 sets - 10 reps ?Seated Cervical Rotation AROM - 2 x daily - 7 x weekly - 2 sets - 10 reps ?Seated Cervical Extension AROM - 2 x daily - 7 x weekly - 2 sets - 10 reps ?Seated Overhead Reach Stretch - 2 x daily - 7 x weekly - 2 sets - 10 reps ?Shoulder External Rotation and Scapular Retraction with Resistance - 1-2 x daily - 7 x weekly - 2 sets - 10 reps ?Standing Shoulder Horizontal Abduction with Resistance - 1-2 x daily - 7 x weekly - 2 sets - 10 reps ?Standing Shoulder Single Arm PNF D2 Flexion with Resistance - 1-2 x daily - 7 x weekly - 1 sets - 10 reps ?Seated Thoracic Extension with Hands Behind Neck - 1 x daily - 7 x weekly - 2 sets - 10 reps ?Standing Shoulder Flexion with Resistance - 1 x daily - 7 x weekly - 2 sets - 10 reps ?Standing Single Arm Shoulder Abduction with Resistance - 1 x daily - 7 x weekly - 2 sets -  10 reps ?Shoulder extension with resistance - Neutral - 1 x daily - 7 x weekly - 2 sets - 10 reps ?Standing Shoulder Row with Anchored Resistance - 1 x daily - 7 x weekly - 2 sets - 10 reps ? ?  ?ASSESSMEN

## 2022-02-09 ENCOUNTER — Ambulatory Visit: Payer: Medicare Other | Admitting: Rehabilitative and Restorative Service Providers"

## 2022-02-09 ENCOUNTER — Encounter: Payer: Self-pay | Admitting: Rehabilitative and Restorative Service Providers"

## 2022-02-09 DIAGNOSIS — M6281 Muscle weakness (generalized): Secondary | ICD-10-CM

## 2022-02-09 DIAGNOSIS — R262 Difficulty in walking, not elsewhere classified: Secondary | ICD-10-CM

## 2022-02-09 DIAGNOSIS — R293 Abnormal posture: Secondary | ICD-10-CM

## 2022-02-09 DIAGNOSIS — M542 Cervicalgia: Secondary | ICD-10-CM | POA: Diagnosis not present

## 2022-02-09 DIAGNOSIS — M25551 Pain in right hip: Secondary | ICD-10-CM

## 2022-02-09 DIAGNOSIS — M436 Torticollis: Secondary | ICD-10-CM

## 2022-02-09 NOTE — Therapy (Signed)
?OUTPATIENT PHYSICAL THERAPY REASSESSMENT NOTE ? ? ?Patient Name: Justin Hampton ?MRN: [id number] ?DOB:06-09-1946, 76 y.o., male ?Today's Date: 02/09/2022 ? ?PCP: Bartholome Bill, MD ?REFERRING PROVIDER: Eldridge Abrahams, NP ? ? ? PT End of Session - 02/09/22 1201   ? ? Visit Number 14   ? Date for PT Re-Evaluation 04/08/22   ? Authorization Type UHC   ? PT Start Time 1157   ? PT Stop Time 1230   ? PT Time Calculation (min) 33 min   ? Activity Tolerance Patient tolerated treatment well   ? Behavior During Therapy [facility] for tasks assessed/performed   ? ?  ?  ? ?  ? ? ? ?Past Medical History:  ?Diagnosis Date  ? Adenomatous colon polyp   ? Arthritis   ? fingers  ? 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 Cirrhosis ([place])   ? Diverticulosis   ? GERD (gastroesophageal reflux disease)   ? 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 Hepatic encephalopathy ([place]) 04/12/2012  ? Hepatitis C   ? treated  ? Hypoglycemia   ? Hypothyroidism   ? ?Past Surgical History:  ?Procedure Laterality Date  ? APPENDECTOMY    ? CARPAL TUNNEL RELEASE Bilateral   ? COLONOSCOPY    ? ESOPHAGOGASTRODUODENOSCOPY    ? x 2  ? ESOPHAGOGASTRODUODENOSCOPY (EGD) WITH PROPOFOL N/A 10/29/2018  ? Procedure: ESOPHAGOGASTRODUODENOSCOPY (EGD) WITH PROPOFOL;  Surgeon: Rush Landmark [name], MD;  Location: [place];  Service: Gastroenterology;  Laterality: N/A;  ? EUS N/A 10/29/2018  ? Procedure: UPPER ENDOSCOPIC ULTRASOUND (EUS) RADIAL;  Surgeon: Rush Landmark [name], MD;  Location: [place];  Service: Gastroenterology;  Laterality: N/A;  ? ?Patient Active Problem List  ? Diagnosis Date Noted  ? Hypotension 04/01/2021  ? Lactic acidosis 04/01/2021  ? Loose stools 04/01/2021  ? AKI (acute kidney injury) ([place]) 04/01/2021  ? Syncope 04/01/2021  ? Near syncope 03/31/2021  ? Hypoglycemia 10/31/2018  ? Impaired functional mobility, balance, gait, and endurance 10/31/2018  ? Dilation of pancreatic duct 10/02/2018  ? Hepatitis B core antibody  positive 09/28/2018  ? 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 History of encephalopathy 09/28/2018  ? Vascular dementia without behavioral disturbance ([place])   ? History of hepatitis C   ? Hypothyroidism (acquired)   ? Gastroesophageal reflux disease   ? Aneurysm, cerebral, nonruptured   ? AMS (altered mental status) 08/17/2018  ? Gait disturbance 08/02/2018  ? Mild memory disturbance 08/02/2018  ? Hepatic encephalopathy ([place]) 07/13/2018  ? 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 CKD (chronic kidney disease), stage III ([place]) 07/13/2018  ? Cervical stenosis of spine 05/29/2018  ? Degenerative disc disease, cervical 05/29/2018  ? Iron deficiency anemia due to chronic blood loss 09/12/2017  ? Rectal bleeding 06/06/2017  ? Acute renal failure (ARF) ([place]) 06/06/2017  ? Hyponatremia 06/06/2017  ? Hyperkalemia 06/06/2017  ? Anemia 06/06/2017  ? GI bleeding 06/06/2017  ? 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 Other dental procedure status 05/22/2017  ? Family history of CHF (congestive heart failure) 01-25-17  ? Family history of sudden death 01-25-2017  ? Dyspnea 13-Jul-2016  ? Other disorder of circulatory system 04/12/2012  ? Phlebolithiasis 04/12/2012  ? Abnormal prostate by palpation 04/11/2012  ? Benign neoplasm of colon 04/11/2012  ? Degenerative joint disease of pelvic region 04/11/2012  ? Disorder of prostate 04/11/2012  ? Iron excess 04/11/2012  ? Lumbosacral spondylosis without myelopathy 04/11/2012  ?  Nocturia 04/11/2012  ? Orthostatic hypotension 04/11/2012  ? Osteoarthritis of both hips 04/11/2012  ? 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 Left sided sciatica 04/11/2012  ? History of colonic polyps 10/03/2011  ? ? ?REFERRING DIAG: Cervicalgia ? ?THERAPY DIAG:  ?Cervicalgia ? ?Neck stiffness ? ?Muscle weakness (generalized) ? ?Abnormal posture ? ?Difficulty in walking, not elsewhere classified ? ?Pain in right hip ? ?PERTINENT HISTORY: OA, Cirrhosis, GERD, Hemophilia, Hepatitis C,  Hypothyroidism ? ?PRECAUTIONS: Fall ? ?SUBJECTIVE: Pt reports that he is still having hip pain and feeling unsteady with ambulation. ? ?PAIN:  ?Are you having pain? Yes ?NPRS scale: 6/10 ?Pain location: hip ?Pain orientation: Right  ?PAIN TYPE: sore ?Pain description: intermittent  ?Aggravating factors: during movement ?Relieving factors: rest ? ? ?OBJECTIVE:  ?  ?DIAGNOSTIC FINDINGS:  ?No new imaging ?  ?PATIENT SURVEYS:  ?FOTO 46% (59% projected by visit 11) ?  FOTO on 01/24/2022:  49% ?  ?  ?COGNITION: ?Overall cognitive status: Within functional limits for tasks assessed ?  ?POSTURE:  ?Forward head, kyphotic thoracic spine ?  ?CERVICAL AROM/PROM ?  ?A/PROM A/ROM (deg) ?12/22/2021 A/ROM ?01/24/22 A/ROM ?02/09/22  ?Flexion 45  45  ?Extension '10 10 15  '$ ?Right lateral flexion 12  20  ?Left lateral flexion 10  20  ?Right rotation 40 30 40  ?Left rotation 40 30 40  ? (Blank rows = not tested) ?  ?UE MMT: ?Bilateral UE grossly 4 to 4+/5 throughout ?R hip strength of 4/5, L hip strength of grossly 4+ to 5/5 throughout ?  ?FUNCTIONAL TEST ? ?02/09/2022: ?5 times sit to stand: 14.6 sec with UE use ?Timed up and go (TUG): 18.4 sec without assistive device ? ?01/24/2022: ?Timed up and go (TUG): 16.1 sec without assistive device ? ?01/18/2022: ?Timed up and go (TUG): 16.1 sec without assistive device ? ?12/22/2021: ?5 times sit to stand: 15.9 sec with UE use ?Timed up and go (TUG): 16 sec without assistive device ?  ?  ?  ?TODAY'S TREATMENT:  ?  ?02/09/2022: ?Nustep L5 x47mn. PT present to discuss status ?Seated:  chin tucks x20.  Shoulder flexion, scaption, shoulder ER, horizontal abduction with red tband.  2x10 each bilat. Pink ball at thoracic. ?Seated rows and shoulder extension 2x10 with blue theraband ?Thoracic extension witting against ball 2x10: 2nd set hold 3 sec  ?Sitting cervical extension A/ROM 2x10; hold 3 sec ? ?02/07/2022: ?Nustep L5x  6 min. PT present to discuss status ?Seated:  shoulder flexion, scaption, shoulder ER,  horizontal abduction with red tband.  2x10 each bilat. Pink ball at thoracic. ?Seated rows and shoulder extension 2x10 with blue theraband ?Sitting cervical extension A/ROM 2x10; hold 3 sec ?Thoracic extension witting against ball 2x10: 2nd set hold 3 sec  ?Wall push ups 2x10 ?UBE level 1.0 x3 min each way.  PT present monitoring dyspnea. ? ?01/31/22: ?Arm bike L1.5  2 min each direction PTA present to discuss status ?Seated:  shoulder flexion, scaption, shoulder ER, horizontal abduction with green tband.  x10 each bilat. Pink ball at thoracic. ?Sit to/from stand holding 5# kettlebell:  x10 with upright row and VC to stand tall ?Blue band standing rows 2x10 ?Sitting cervical extension A/ROM 2x10; hold 3 sec  ?Nustep L4x 10 min. PTA monitoring for SOB ? ?PATIENT EDUCATION:  ?Education details: Pt educated in posture. ?Person educated: Patient ?Education method: Explanation ?Education comprehension: verbalized understanding ?  ?  ?HOME EXERCISE PROGRAM: ?Access Code: [email or web address]?URL: [email or web address] ?Date: 12/29/2021 ?Prepared by: SJuel Burrow? ?  Exercises ?Supine Cervical Retraction with Towel - 1-2 x daily - 7 x weekly - 2 sets - 10 reps - 2 sec hold ?Seated Scapular Retraction - 2 x daily - 7 x weekly - 2 sets - 10 reps ?Seated Cervical Rotation AROM - 2 x daily - 7 x weekly - 2 sets - 10 reps ?Seated Cervical Extension AROM - 2 x daily - 7 x weekly - 2 sets - 10 reps ?Seated Overhead Reach Stretch - 2 x daily - 7 x weekly - 2 sets - 10 reps ?Shoulder External Rotation and Scapular Retraction with Resistance - 1-2 x daily - 7 x weekly - 2 sets - 10 reps ?Standing Shoulder Horizontal Abduction with Resistance - 1-2 x daily - 7 x weekly - 2 sets - 10 reps ?Standing Shoulder Single Arm PNF D2 Flexion with Resistance - 1-2 x daily - 7 x weekly - 1 sets - 10 reps ?Seated Thoracic Extension with Hands Behind Neck - 1 x daily - 7 x weekly - 2 sets - 10 reps ?Standing Shoulder Flexion with Resistance - 1  x daily - 7 x weekly - 2 sets - 10 reps ?Standing Single Arm Shoulder Abduction with Resistance - 1 x daily - 7 x weekly - 2 sets - 10 reps ?Shoulder extension with resistance - Neutral - 1 x daily - 7 x weekly - 2

## 2022-02-14 ENCOUNTER — Encounter: Payer: Self-pay | Admitting: Rehabilitative and Restorative Service Providers"

## 2022-02-14 ENCOUNTER — Ambulatory Visit: Payer: Medicare Other | Admitting: Rehabilitative and Restorative Service Providers"

## 2022-02-14 DIAGNOSIS — M6281 Muscle weakness (generalized): Secondary | ICD-10-CM

## 2022-02-14 DIAGNOSIS — M542 Cervicalgia: Secondary | ICD-10-CM | POA: Diagnosis not present

## 2022-02-14 DIAGNOSIS — M436 Torticollis: Secondary | ICD-10-CM

## 2022-02-14 DIAGNOSIS — R293 Abnormal posture: Secondary | ICD-10-CM

## 2022-02-14 NOTE — Therapy (Signed)
?OUTPATIENT PHYSICAL THERAPY REASSESSMENT NOTE ? ? ?Patient Name: Justin Hampton ?MRN: [id number] ?DOB:12/25/1945, 76 y.o., male ?Today's Date: 02/14/2022 ? ?PCP: Bartholome Bill, MD ?REFERRING PROVIDER: Eldridge Abrahams, NP ? ? ? PT End of Session - 02/14/22 1157   ? ? Visit Number 15   ? Date for PT Re-Evaluation 04/08/22   ? Authorization Type UHC   ? PT Start Time 1152   ? PT Stop Time 1230   ? PT Time Calculation (min) 38 min   ? Activity Tolerance Patient tolerated treatment well   ? Behavior During Therapy [facility] for tasks assessed/performed   ? ?  ?  ? ?  ? ? ? ?Past Medical History:  ?Diagnosis Date  ? Adenomatous colon polyp   ? Arthritis   ? fingers  ? 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 Cirrhosis ([place])   ? Diverticulosis   ? GERD (gastroesophageal reflux disease)   ? 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 Hepatic encephalopathy ([place]) 04/12/2012  ? Hepatitis C   ? treated  ? Hypoglycemia   ? Hypothyroidism   ? ?Past Surgical History:  ?Procedure Laterality Date  ? APPENDECTOMY    ? CARPAL TUNNEL RELEASE Bilateral   ? COLONOSCOPY    ? ESOPHAGOGASTRODUODENOSCOPY    ? x 2  ? ESOPHAGOGASTRODUODENOSCOPY (EGD) WITH PROPOFOL N/A 10/29/2018  ? Procedure: ESOPHAGOGASTRODUODENOSCOPY (EGD) WITH PROPOFOL;  Surgeon: Rush Landmark [name], MD;  Location: [place];  Service: Gastroenterology;  Laterality: N/A;  ? EUS N/A 10/29/2018  ? Procedure: UPPER ENDOSCOPIC ULTRASOUND (EUS) RADIAL;  Surgeon: Rush Landmark [name], MD;  Location: [place];  Service: Gastroenterology;  Laterality: N/A;  ? ?Patient Active Problem List  ? Diagnosis Date Noted  ? Hypotension 04/01/2021  ? Lactic acidosis 04/01/2021  ? Loose stools 04/01/2021  ? AKI (acute kidney injury) ([place]) 04/01/2021  ? Syncope 04/01/2021  ? Near syncope 03/31/2021  ? Hypoglycemia 10/31/2018  ? Impaired functional mobility, balance, gait, and endurance 10/31/2018  ? Dilation of pancreatic duct 10/02/2018  ? Hepatitis B core antibody  positive 09/28/2018  ? 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 History of encephalopathy 09/28/2018  ? Vascular dementia without behavioral disturbance ([place])   ? History of hepatitis C   ? Hypothyroidism (acquired)   ? Gastroesophageal reflux disease   ? Aneurysm, cerebral, nonruptured   ? AMS (altered mental status) 08/17/2018  ? Gait disturbance 08/02/2018  ? Mild memory disturbance 08/02/2018  ? Hepatic encephalopathy ([place]) 07/13/2018  ? 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 CKD (chronic kidney disease), stage III ([place]) 07/13/2018  ? Cervical stenosis of spine 05/29/2018  ? Degenerative disc disease, cervical 05/29/2018  ? Iron deficiency anemia due to chronic blood loss 09/12/2017  ? Rectal bleeding 06/06/2017  ? Acute renal failure (ARF) ([place]) 06/06/2017  ? Hyponatremia 06/06/2017  ? Hyperkalemia 06/06/2017  ? Anemia 06/06/2017  ? GI bleeding 06/06/2017  ? 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 Other dental procedure status 05/22/2017  ? Family history of CHF (congestive heart failure) 01-29-17  ? Family history of sudden death January 29, 2017  ? Dyspnea 07/17/2016  ? Other disorder of circulatory system 04/12/2012  ? Phlebolithiasis 04/12/2012  ? Abnormal prostate by palpation 04/11/2012  ? Benign neoplasm of colon 04/11/2012  ? Degenerative joint disease of pelvic region 04/11/2012  ? Disorder of prostate 04/11/2012  ? Iron excess 04/11/2012  ? Lumbosacral spondylosis without myelopathy 04/11/2012  ?  Nocturia 04/11/2012  ? Orthostatic hypotension 04/11/2012  ? Osteoarthritis of both hips 04/11/2012  ? 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 Left sided sciatica 04/11/2012  ? History of colonic polyps 10/03/2011  ? ? ?REFERRING DIAG: Cervicalgia ? ?THERAPY DIAG:  ?Cervicalgia ? ?Neck stiffness ? ?Muscle weakness (generalized) ? ?Abnormal posture ? ?PERTINENT HISTORY: OA, Cirrhosis, GERD, Hemophilia, Hepatitis C, Hypothyroidism ? ?PRECAUTIONS: Fall ? ?SUBJECTIVE: Pt reports that he is  continuing to have some right leg pain from his fall, states that he has been stretching it and his MD told him that it was likely a bruise from the fall. "I took some Tylenol before I came here." ? ?PAIN:  ?Are you having pain? Yes ?NPRS scale: 5/10 ?Pain location: hip ?Pain orientation: Right  ?PAIN TYPE: sore ?Pain description: intermittent  ?Aggravating factors: during movement ?Relieving factors: rest ? ? ?OBJECTIVE:  ?  ?DIAGNOSTIC FINDINGS:  ?No new imaging ?  ?PATIENT SURVEYS:  ?FOTO 46% (59% projected by visit 11) ?  FOTO on 01/24/2022:  49% ?  ?  ?COGNITION: ?Overall cognitive status: Within functional limits for tasks assessed ?  ?POSTURE:  ?Forward head, kyphotic thoracic spine ?  ?CERVICAL AROM/PROM ?  ?A/PROM A/ROM (deg) ?12/22/2021 A/ROM ?01/24/22 A/ROM ?02/09/22  ?Flexion 45  45  ?Extension '10 10 15  '$ ?Right lateral flexion 12  20  ?Left lateral flexion 10  20  ?Right rotation 40 30 40  ?Left rotation 40 30 40  ? (Blank rows = not tested) ?  ?UE MMT: ?Bilateral UE grossly 4 to 4+/5 throughout ?R hip strength of 4/5, L hip strength of grossly 4+ to 5/5 throughout ?  ?FUNCTIONAL TEST ? ?02/09/2022: ?5 times sit to stand: 14.6 sec with UE use ?Timed up and go (TUG): 18.4 sec without assistive device ? ?01/24/2022: ?Timed up and go (TUG): 16.1 sec without assistive device ? ?01/18/2022: ?Timed up and go (TUG): 16.1 sec without assistive device ? ?12/22/2021: ?5 times sit to stand: 15.9 sec with UE use ?Timed up and go (TUG): 16 sec without assistive device ?  ?  ?  ?TODAY'S TREATMENT:  ?  ?02/14/2022: ?Nustep L5 x66mn. PT present to discuss status ?Seated:  chin tucks x20.  Shoulder flexion, scaption, shoulder ER, horizontal abduction with red tband.  2x10 each bilat. Pink ball at thoracic. ?Sitting cervical extension A/ROM 2x10; hold 3 sec ?Thoracic extension witting against ball 2x10: 2nd set hold 3 sec  ?Seated rows, shoulder extension, and bicep curls 2x10 with blue theraband ? ?02/09/2022: ?Nustep L5 x7103m. PT  present to discuss status ?Seated:  chin tucks x20.  Shoulder flexion, scaption, shoulder ER, horizontal abduction with red tband.  2x10 each bilat. Pink ball at thoracic. ?Seated rows and shoulder extension 2x10 with blue theraband ?Thoracic extension witting against ball 2x10: 2nd set hold 3 sec  ?Sitting cervical extension A/ROM 2x10; hold 3 sec ? ?02/07/2022: ?Nustep L5x  6 min. PT present to discuss status ?Seated:  shoulder flexion, scaption, shoulder ER, horizontal abduction with red tband.  2x10 each bilat. Pink ball at thoracic. ?Seated rows and shoulder extension 2x10 with blue theraband ?Sitting cervical extension A/ROM 2x10; hold 3 sec ?Thoracic extension witting against ball 2x10: 2nd set hold 3 sec  ?Wall push ups 2x10 ?UBE level 1.0 x3 min each way.  PT present monitoring dyspnea. ? ? ?PATIENT EDUCATION:  ?Education details: Pt educated in posture. ?Person educated: Patient ?Education method: Explanation ?Education comprehension: verbalized understanding ?  ?  ?HOME EXERCISE PROGRAM: ?Access  Code: [email or web address] ?URL: [email or web address] ?Date: 12/29/2021 ?Prepared by: Juel Burrow ? ?Exercises ?Supine Cervical Retraction with Towel - 1-2 x daily - 7 x weekly - 2 sets - 10 reps - 2 sec hold ?Seated Scapular Retraction - 2 x daily - 7 x weekly - 2 sets - 10 reps ?Seated Cervical Rotation AROM - 2 x daily - 7 x weekly - 2 sets - 10 reps ?Seated Cervical Extension AROM - 2 x daily - 7 x weekly - 2 sets - 10 reps ?Seated Overhead Reach Stretch - 2 x daily - 7 x weekly - 2 sets - 10 reps ?Shoulder External Rotation and Scapular Retraction with Resistance - 1-2 x daily - 7 x weekly - 2 sets - 10 reps ?Standing Shoulder Horizontal Abduction with Resistance - 1-2 x daily - 7 x weekly - 2 sets - 10 reps ?Standing Shoulder Single Arm PNF D2 Flexion with Resistance - 1-2 x daily - 7 x weekly - 1 sets - 10 reps ?Seated Thoracic Extension with Hands Behind Neck - 1 x daily - 7 x weekly - 2 sets - 10  reps ?Standing Shoulder Flexion with Resistance - 1 x daily - 7 x weekly - 2 sets - 10 reps ?Standing Single Arm Shoulder Abduction with Resistance - 1 x daily - 7 x weekly - 2 sets - 10 reps ?Shoulder extension with re

## 2022-02-16 ENCOUNTER — Encounter: Payer: Self-pay | Admitting: Rehabilitative and Restorative Service Providers"

## 2022-02-16 ENCOUNTER — Ambulatory Visit: Payer: Medicare Other | Admitting: Rehabilitative and Restorative Service Providers"

## 2022-02-16 DIAGNOSIS — R293 Abnormal posture: Secondary | ICD-10-CM

## 2022-02-16 DIAGNOSIS — M436 Torticollis: Secondary | ICD-10-CM

## 2022-02-16 DIAGNOSIS — M542 Cervicalgia: Secondary | ICD-10-CM

## 2022-02-16 DIAGNOSIS — M6281 Muscle weakness (generalized): Secondary | ICD-10-CM

## 2022-02-16 NOTE — Therapy (Signed)
?OUTPATIENT PHYSICAL THERAPY REASSESSMENT NOTE ? ? ?Patient Name: Justin Hampton ?MRN: [id number] ?DOB:21-Sep-1946, 76 y.o., male ?Today's Date: 02/16/2022 ? ?PCP: Bartholome Bill, MD ?REFERRING PROVIDER: Eldridge Abrahams, NP ? ? ? PT End of Session - 02/16/22 1154   ? ? Visit Number 16   ? Date for PT Re-Evaluation 04/08/22   ? Authorization Type UHC   ? PT Start Time 1150   ? PT Stop Time 1228   ? PT Time Calculation (min) 38 min   ? Activity Tolerance Patient tolerated treatment well   ? Behavior During Therapy [facility] for tasks assessed/performed   ? ?  ?  ? ?  ? ? ? ?Past Medical History:  ?Diagnosis Date  ? Adenomatous colon polyp   ? Arthritis   ? fingers  ? 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 Cirrhosis ([place])   ? Diverticulosis   ? GERD (gastroesophageal reflux disease)   ? 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 Hepatic encephalopathy ([place]) 04/12/2012  ? Hepatitis C   ? treated  ? Hypoglycemia   ? Hypothyroidism   ? ?Past Surgical History:  ?Procedure Laterality Date  ? APPENDECTOMY    ? CARPAL TUNNEL RELEASE Bilateral   ? COLONOSCOPY    ? ESOPHAGOGASTRODUODENOSCOPY    ? x 2  ? ESOPHAGOGASTRODUODENOSCOPY (EGD) WITH PROPOFOL N/A 10/29/2018  ? Procedure: ESOPHAGOGASTRODUODENOSCOPY (EGD) WITH PROPOFOL;  Surgeon: Rush Landmark [name], MD;  Location: [place];  Service: Gastroenterology;  Laterality: N/A;  ? EUS N/A 10/29/2018  ? Procedure: UPPER ENDOSCOPIC ULTRASOUND (EUS) RADIAL;  Surgeon: Rush Landmark [name], MD;  Location: [place];  Service: Gastroenterology;  Laterality: N/A;  ? ?Patient Active Problem List  ? Diagnosis Date Noted  ? Hypotension 04/01/2021  ? Lactic acidosis 04/01/2021  ? Loose stools 04/01/2021  ? AKI (acute kidney injury) ([place]) 04/01/2021  ? Syncope 04/01/2021  ? Near syncope 03/31/2021  ? Hypoglycemia 10/31/2018  ? Impaired functional mobility, balance, gait, and endurance 10/31/2018  ? Dilation of pancreatic duct 10/02/2018  ? Hepatitis B core antibody  positive 09/28/2018  ? 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 History of encephalopathy 09/28/2018  ? Vascular dementia without behavioral disturbance ([place])   ? History of hepatitis C   ? Hypothyroidism (acquired)   ? Gastroesophageal reflux disease   ? Aneurysm, cerebral, nonruptured   ? AMS (altered mental status) 08/17/2018  ? Gait disturbance 08/02/2018  ? Mild memory disturbance 08/02/2018  ? Hepatic encephalopathy ([place]) 07/13/2018  ? 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 CKD (chronic kidney disease), stage III ([place]) 07/13/2018  ? Cervical stenosis of spine 05/29/2018  ? Degenerative disc disease, cervical 05/29/2018  ? Iron deficiency anemia due to chronic blood loss 09/12/2017  ? Rectal bleeding 06/06/2017  ? Acute renal failure (ARF) ([place]) 06/06/2017  ? Hyponatremia 06/06/2017  ? Hyperkalemia 06/06/2017  ? Anemia 06/06/2017  ? GI bleeding 06/06/2017  ? 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 Other dental procedure status 05/22/2017  ? Family history of CHF (congestive heart failure) 02/03/17  ? Family history of sudden death 02/03/17  ? Dyspnea 2016-07-22  ? Other disorder of circulatory system 04/12/2012  ? Phlebolithiasis 04/12/2012  ? Abnormal prostate by palpation 04/11/2012  ? Benign neoplasm of colon 04/11/2012  ? Degenerative joint disease of pelvic region 04/11/2012  ? Disorder of prostate 04/11/2012  ? Iron excess 04/11/2012  ? Lumbosacral spondylosis without myelopathy 04/11/2012  ?  Nocturia 04/11/2012  ? Orthostatic hypotension 04/11/2012  ? Osteoarthritis of both hips 04/11/2012  ? 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 Left sided sciatica 04/11/2012  ? History of colonic polyps 10/03/2011  ? ? ?REFERRING DIAG: Cervicalgia ? ?THERAPY DIAG:  ?Cervicalgia ? ?Neck stiffness ? ?Muscle weakness (generalized) ? ?Abnormal posture ? ?PERTINENT HISTORY: OA, Cirrhosis, GERD, Hemophilia, Hepatitis C, Hypothyroidism ? ?PRECAUTIONS: Fall ? ?SUBJECTIVE: "I find if I get up and move  around, it sort of subsides for a little bit" ? ?PAIN:  ?Are you having pain? Yes ?NPRS scale: 5/10 ?Pain location: R leg ?Pain orientation: Right  ?PAIN TYPE: sore ?Pain description: intermittent  ?Aggravating factors: during movement ?Relieving factors: rest ? ? ?OBJECTIVE:  ?  ?DIAGNOSTIC FINDINGS:  ?No new imaging ?  ?PATIENT SURVEYS:  ?FOTO 46% (59% projected by visit 11) ?  FOTO on 01/24/2022:  49% ?  ?  ?COGNITION: ?Overall cognitive status: Within functional limits for tasks assessed ?  ?POSTURE:  ?Forward head, kyphotic thoracic spine ?  ?CERVICAL AROM/PROM ?  ?A/PROM A/ROM (deg) ?12/22/2021 A/ROM ?01/24/22 A/ROM ?02/09/22  ?Flexion 45  45  ?Extension '10 10 15  '$ ?Right lateral flexion 12  20  ?Left lateral flexion 10  20  ?Right rotation 40 30 40  ?Left rotation 40 30 40  ? (Blank rows = not tested) ?  ?UE MMT: ?Bilateral UE grossly 4 to 4+/5 throughout ?R hip strength of 4/5, L hip strength of grossly 4+ to 5/5 throughout ?  ?FUNCTIONAL TEST ? ?02/09/2022: ?5 times sit to stand: 14.6 sec with UE use ?Timed up and go (TUG): 18.4 sec without assistive device ? ?01/24/2022: ?Timed up and go (TUG): 16.1 sec without assistive device ? ?01/18/2022: ?Timed up and go (TUG): 16.1 sec without assistive device ? ?12/22/2021: ?5 times sit to stand: 15.9 sec with UE use ?Timed up and go (TUG): 16 sec without assistive device ?  ?  ?  ?TODAY'S TREATMENT:  ?  ?02/16/2022: ?Nustep L5 x47mn. PT present to discuss status ?Seated:  chin tucks x20.  Shoulder flexion, scaption, shoulder ER, horizontal abduction, and shoulder diagonals with red tband.  2x10 each bilat. Pink ball at thoracic. ?Thoracic extension sitting against ball 2x10: 2nd set hold 3 sec  ?Sitting cervical extension A/ROM 2x10; hold 3 sec ?Seated rows, shoulder extension, and bicep curls 2x10 with blue theraband ?Sit to stand holding 2# with chest press x10 ?Wall push ups 2x10 ? ?02/14/2022: ?Nustep L5 x762m. PT present to discuss status ?Seated:  chin tucks x20.  Shoulder  flexion, scaption, shoulder ER, horizontal abduction with red tband.  2x10 each bilat. Pink ball at thoracic. ?Sitting cervical extension A/ROM 2x10; hold 3 sec ?Thoracic extension sitting against ball 2x10: 2nd set hold 3 sec  ?Seated rows, shoulder extension, and bicep curls 2x10 with blue theraband ? ?02/09/2022: ?Nustep L5 x7m78m PT present to discuss status ?Seated:  chin tucks x20.  Shoulder flexion, scaption, shoulder ER, horizontal abduction with red tband.  2x10 each bilat. Pink ball at thoracic. ?Seated rows and shoulder extension 2x10 with blue theraband ?Thoracic extension sitting against ball 2x10: 2nd set hold 3 sec  ?Sitting cervical extension A/ROM 2x10; hold 3 sec ? ? ?PATIENT EDUCATION:  ?Education details: Pt educated in posture. ?Person educated: Patient ?Education method: Explanation ?Education comprehension: verbalized understanding ?  ?  ?HOME EXERCISE PROGRAM: ?Access Code: [email or web address]: [email or web address] ?Date: 12/29/2021 ?Prepared by: ShaJuel Burrow?Exercises ?Supine Cervical Retraction with Towel - 1-2 x daily -  7 x weekly - 2 sets - 10 reps - 2 sec hold ?Seated Scapular Retraction - 2 x daily - 7 x weekly - 2 sets - 10 reps ?Seated Cervical Rotation AROM - 2 x daily - 7 x weekly - 2 sets - 10 reps ?Seated Cervical Extension AROM - 2 x daily - 7 x weekly - 2 sets - 10 reps ?Seated Overhead Reach Stretch - 2 x daily - 7 x weekly - 2 sets - 10 reps ?Shoulder External Rotation and Scapular Retraction with Resistance - 1-2 x daily - 7 x weekly - 2 sets - 10 reps ?Standing Shoulder Horizontal Abduction with Resistance - 1-2 x daily - 7 x weekly - 2 sets - 10 reps ?Standing Shoulder Single Arm PNF D2 Flexion with Resistance - 1-2 x daily - 7 x weekly - 1 sets - 10 reps ?Seated Thoracic Extension with Hands Behind Neck - 1 x daily - 7 x weekly - 2 sets - 10 reps ?Standing Shoulder Flexion with Resistance - 1 x daily - 7 x weekly - 2 sets - 10 reps ?Standing Single Arm Shoulder  Abduction with Resistance - 1 x daily - 7 x weekly - 2 sets - 10 reps ?Shoulder extension with resistance - Neutral - 1 x daily - 7 x weekly - 2 sets - 10 reps ?Standing Shoulder Row with Anchored Resista

## 2022-02-23 ENCOUNTER — Encounter: Payer: Self-pay | Admitting: Rehabilitative and Restorative Service Providers"

## 2022-02-23 ENCOUNTER — Ambulatory Visit: Payer: Medicare Other | Attending: Nurse Practitioner | Admitting: Rehabilitative and Restorative Service Providers"

## 2022-02-23 DIAGNOSIS — M436 Torticollis: Secondary | ICD-10-CM | POA: Insufficient documentation

## 2022-02-23 DIAGNOSIS — M25551 Pain in right hip: Secondary | ICD-10-CM | POA: Insufficient documentation

## 2022-02-23 DIAGNOSIS — R262 Difficulty in walking, not elsewhere classified: Secondary | ICD-10-CM | POA: Insufficient documentation

## 2022-02-23 DIAGNOSIS — R293 Abnormal posture: Secondary | ICD-10-CM | POA: Insufficient documentation

## 2022-02-23 DIAGNOSIS — M6281 Muscle weakness (generalized): Secondary | ICD-10-CM | POA: Insufficient documentation

## 2022-02-23 DIAGNOSIS — M542 Cervicalgia: Secondary | ICD-10-CM | POA: Insufficient documentation

## 2022-02-23 NOTE — Therapy (Signed)
?OUTPATIENT PHYSICAL THERAPY REASSESSMENT NOTE ? ? ?Patient Name: Justin Hampton ?MRN: [id number] ?DOB:1946-01-10, 76 y.o., male ?Today's Date: 02/23/2022 ? ?PCP: Bartholome Bill, MD ?REFERRING PROVIDER: Eldridge Abrahams, NP ? ? ? PT End of Session - 02/23/22 1022   ? ? Visit Number 17   ? Date for PT Re-Evaluation 04/08/22   ? Authorization Type UHC   ? PT Start Time 1015   ? PT Stop Time 1055   ? PT Time Calculation (min) 40 min   ? Activity Tolerance Patient tolerated treatment well   ? Behavior During Therapy [facility] for tasks assessed/performed   ? ?  ?  ? ?  ? ? ? ?Past Medical History:  ?Diagnosis Date  ? Adenomatous colon polyp   ? Arthritis   ? fingers  ? 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 Cirrhosis ([place])   ? Diverticulosis   ? GERD (gastroesophageal reflux disease)   ? 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 Hepatic encephalopathy ([place]) 04/12/2012  ? Hepatitis C   ? treated  ? Hypoglycemia   ? Hypothyroidism   ? ?Past Surgical History:  ?Procedure Laterality Date  ? APPENDECTOMY    ? CARPAL TUNNEL RELEASE Bilateral   ? COLONOSCOPY    ? ESOPHAGOGASTRODUODENOSCOPY    ? x 2  ? ESOPHAGOGASTRODUODENOSCOPY (EGD) WITH PROPOFOL N/A 10/29/2018  ? Procedure: ESOPHAGOGASTRODUODENOSCOPY (EGD) WITH PROPOFOL;  Surgeon: Rush Landmark [name], MD;  Location: [place];  Service: Gastroenterology;  Laterality: N/A;  ? EUS N/A 10/29/2018  ? Procedure: UPPER ENDOSCOPIC ULTRASOUND (EUS) RADIAL;  Surgeon: Rush Landmark [name], MD;  Location: [place];  Service: Gastroenterology;  Laterality: N/A;  ? ?Patient Active Problem List  ? Diagnosis Date Noted  ? Hypotension 04/01/2021  ? Lactic acidosis 04/01/2021  ? Loose stools 04/01/2021  ? AKI (acute kidney injury) ([place]) 04/01/2021  ? Syncope 04/01/2021  ? Near syncope 03/31/2021  ? Hypoglycemia 10/31/2018  ? Impaired functional mobility, balance, gait, and endurance 10/31/2018  ? Dilation of pancreatic duct 10/02/2018  ? Hepatitis B core antibody  positive 09/28/2018  ? 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 History of encephalopathy 09/28/2018  ? Vascular dementia without behavioral disturbance ([place])   ? History of hepatitis C   ? Hypothyroidism (acquired)   ? Gastroesophageal reflux disease   ? Aneurysm, cerebral, nonruptured   ? AMS (altered mental status) 08/17/2018  ? Gait disturbance 08/02/2018  ? Mild memory disturbance 08/02/2018  ? Hepatic encephalopathy ([place]) 07/13/2018  ? 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 CKD (chronic kidney disease), stage III ([place]) 07/13/2018  ? Cervical stenosis of spine 05/29/2018  ? Degenerative disc disease, cervical 05/29/2018  ? Iron deficiency anemia due to chronic blood loss 09/12/2017  ? Rectal bleeding 06/06/2017  ? Acute renal failure (ARF) ([employer]) 06/06/2017  ? Hyponatremia 06/06/2017  ? Hyperkalemia 06/06/2017  ? Anemia 06/06/2017  ? GI bleeding 06/06/2017  ? 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 Other dental procedure status 05/22/2017  ? Family history of CHF (congestive heart failure) 2017-02-05  ? Family history of sudden death 2017/02/05  ? Dyspnea 07-24-16  ? Other disorder of circulatory system 04/12/2012  ? Phlebolithiasis 04/12/2012  ? Abnormal prostate by palpation 04/11/2012  ? Benign neoplasm of colon 04/11/2012  ? Degenerative joint disease of pelvic region 04/11/2012  ? Disorder of prostate 04/11/2012  ? Iron excess 04/11/2012  ? Lumbosacral spondylosis without myelopathy 04/11/2012  ?  Nocturia 04/11/2012  ? Orthostatic hypotension 04/11/2012  ? Osteoarthritis of both hips 04/11/2012  ? 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 Left sided sciatica 04/11/2012  ? History of colonic polyps 10/03/2011  ? ? ?REFERRING DIAG: Cervicalgia ? ?THERAPY DIAG:  ?Cervicalgia ? ?Neck stiffness ? ?Muscle weakness (generalized) ? ?Abnormal posture ? ?PERTINENT HISTORY: OA, Cirrhosis, GERD, Hemophilia, Hepatitis C, Hypothyroidism ? ?PRECAUTIONS: Fall ? ?SUBJECTIVE: Pt reports feeling better  from his fall ? ?PAIN:  ?Are you having pain? No ?NPRS scale: 0/10 ?Pain location: R leg ?Pain orientation: Right  ?PAIN TYPE: sore ?Pain description: intermittent  ?Aggravating factors: during movement ?Relieving factors: rest ? ? ?OBJECTIVE:  ?  ?DIAGNOSTIC FINDINGS:  ?No new imaging ?  ?PATIENT SURVEYS:  ?FOTO 46% (59% projected by visit 11) ?  FOTO on 01/24/2022:  49% ?  ?  ?COGNITION: ?Overall cognitive status: Within functional limits for tasks assessed ?  ?POSTURE:  ?Forward head, kyphotic thoracic spine ?  ?CERVICAL AROM/PROM ?  ?A/PROM A/ROM (deg) ?12/22/2021 A/ROM ?01/24/22 A/ROM ?02/09/22  ?Flexion 45  45  ?Extension '10 10 15  '$ ?Right lateral flexion 12  20  ?Left lateral flexion 10  20  ?Right rotation 40 30 40  ?Left rotation 40 30 40  ? (Blank rows = not tested) ?  ?UE MMT: ?Bilateral UE grossly 4 to 4+/5 throughout ?R hip strength of 4/5, L hip strength of grossly 4+ to 5/5 throughout ?  ?FUNCTIONAL TEST ? ?02/09/2022: ?5 times sit to stand: 14.6 sec with UE use ?Timed up and go (TUG): 18.4 sec without assistive device ? ?01/24/2022: ?Timed up and go (TUG): 16.1 sec without assistive device ? ?01/18/2022: ?Timed up and go (TUG): 16.1 sec without assistive device ? ?12/22/2021: ?5 times sit to stand: 15.9 sec with UE use ?Timed up and go (TUG): 16 sec without assistive device ?  ?  ?  ?TODAY'S TREATMENT:  ?  ?02/23/2022: ?Nustep L5 x70mn (538 steps). PT present to discuss status ?Sit to/from stand holding 5# kettlebell:  with chest press 2x10 ?Seated:  chin tucks x20.  Shoulder flexion, scaption, shoulder ER, horizontal abduction, and shoulder diagonals with red tband.  2x10 each bilat. Pink ball at thoracic. ?In standing:  cervical extension, back extension.  X20 each ?Doorway pec stretch 2x20 sec ?Wall push ups 2x10 ? ?02/16/2022: ?Nustep L5 x775m. PT present to discuss status ?Seated:  chin tucks x20.  Shoulder flexion, scaption, shoulder ER, horizontal abduction, and shoulder diagonals with red tband.  2x10 each  bilat. Pink ball at thoracic. ?Thoracic extension sitting against ball 2x10: 2nd set hold 3 sec  ?Sitting cervical extension A/ROM 2x10; hold 3 sec ?Seated rows, shoulder extension, and bicep curls 2x10 with blue theraband ?Sit to stand holding 2# with chest press x10 ?Wall push ups 2x10 ? ?02/14/2022: ?Nustep L5 x7m33m PT present to discuss status ?Seated:  chin tucks x20.  Shoulder flexion, scaption, shoulder ER, horizontal abduction with red tband.  2x10 each bilat. Pink ball at thoracic. ?Sitting cervical extension A/ROM 2x10; hold 3 sec ?Thoracic extension sitting against ball 2x10: 2nd set hold 3 sec  ?Seated rows, shoulder extension, and bicep curls 2x10 with blue theraband ? ?02/09/2022: ?Nustep L5 x7mi44mPT present to discuss status ?Seated:  chin tucks x20.  Shoulder flexion, scaption, shoulder ER, horizontal abduction with red tband.  2x10 each bilat. Pink ball at thoracic. ?Seated rows and shoulder extension 2x10 with blue theraband ?Thoracic extension sitting against ball 2x10: 2nd set hold 3 sec  ?  Sitting cervical extension A/ROM 2x10; hold 3 sec ? ? ?PATIENT EDUCATION:  ?Education details: Pt educated in posture. ?Person educated: Patient ?Education method: Explanation ?Education comprehension: verbalized understanding ?  ?  ?HOME EXERCISE PROGRAM: ?Access Code: [email or web address] ?URL: [email or web address] ?Date: 12/29/2021 ?Prepared by: Juel Burrow ? ?Exercises ?Supine Cervical Retraction with Towel - 1-2 x daily - 7 x weekly - 2 sets - 10 reps - 2 sec hold ?Seated Scapular Retraction - 2 x daily - 7 x weekly - 2 sets - 10 reps ?Seated Cervical Rotation AROM - 2 x daily - 7 x weekly - 2 sets - 10 reps ?Seated Cervical Extension AROM - 2 x daily - 7 x weekly - 2 sets - 10 reps ?Seated Overhead Reach Stretch - 2 x daily - 7 x weekly - 2 sets - 10 reps ?Shoulder External Rotation and Scapular Retraction with Resistance - 1-2 x daily - 7 x weekly - 2 sets - 10 reps ?Standing Shoulder Horizontal  Abduction with Resistance - 1-2 x daily - 7 x weekly - 2 sets - 10 reps ?Standing Shoulder Single Arm PNF D2 Flexion with Resistance - 1-2 x daily - 7 x weekly - 1 sets - 10 reps ?Seated Thoracic Extension with Lamonte Sakai

## 2022-02-28 ENCOUNTER — Ambulatory Visit: Payer: Medicare Other | Admitting: Physical Therapy

## 2022-02-28 ENCOUNTER — Encounter: Payer: Self-pay | Admitting: Physical Therapy

## 2022-02-28 DIAGNOSIS — M542 Cervicalgia: Secondary | ICD-10-CM

## 2022-02-28 DIAGNOSIS — M6281 Muscle weakness (generalized): Secondary | ICD-10-CM

## 2022-02-28 DIAGNOSIS — R293 Abnormal posture: Secondary | ICD-10-CM

## 2022-02-28 DIAGNOSIS — M436 Torticollis: Secondary | ICD-10-CM

## 2022-02-28 DIAGNOSIS — R262 Difficulty in walking, not elsewhere classified: Secondary | ICD-10-CM

## 2022-02-28 NOTE — Therapy (Signed)
?OUTPATIENT PHYSICAL THERAPY REASSESSMENT NOTE ? ? ?Patient Name: Justin Hampton ?MRN: [id number] ?DOB:Sep 05, 1946, 76 y.o., male ?Today's Date: 02/28/2022 ? ?PCP: Bartholome Bill, MD ?REFERRING PROVIDER: Eldridge Abrahams, NP ? ? ? PT End of Session - 02/28/22 1150   ? ? Visit Number 18   ? Date for PT Re-Evaluation 04/08/22   ? Authorization Type UHC   ? PT Start Time 1147   ? PT Stop Time 1227   ? PT Time Calculation (min) 40 min   ? Activity Tolerance Patient tolerated treatment well   ? Behavior During Therapy [facility] for tasks assessed/performed   ? ?  ?  ? ?  ? ? ? ? ?Past Medical History:  ?Diagnosis Date  ? Adenomatous colon polyp   ? Arthritis   ? fingers  ? 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 Cirrhosis ([place])   ? Diverticulosis   ? GERD (gastroesophageal reflux disease)   ? 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 Hepatic encephalopathy ([place]) 04/12/2012  ? Hepatitis C   ? treated  ? Hypoglycemia   ? Hypothyroidism   ? ?Past Surgical History:  ?Procedure Laterality Date  ? APPENDECTOMY    ? CARPAL TUNNEL RELEASE Bilateral   ? COLONOSCOPY    ? ESOPHAGOGASTRODUODENOSCOPY    ? x 2  ? ESOPHAGOGASTRODUODENOSCOPY (EGD) WITH PROPOFOL N/A 10/29/2018  ? Procedure: ESOPHAGOGASTRODUODENOSCOPY (EGD) WITH PROPOFOL;  Surgeon: Rush Landmark [name], MD;  Location: [place];  Service: Gastroenterology;  Laterality: N/A;  ? EUS N/A 10/29/2018  ? Procedure: UPPER ENDOSCOPIC ULTRASOUND (EUS) RADIAL;  Surgeon: Rush Landmark [name], MD;  Location: [place];  Service: Gastroenterology;  Laterality: N/A;  ? ?Patient Active Problem List  ? Diagnosis Date Noted  ? Hypotension 04/01/2021  ? Lactic acidosis 04/01/2021  ? Loose stools 04/01/2021  ? AKI (acute kidney injury) ([place]) 04/01/2021  ? Syncope 04/01/2021  ? Near syncope 03/31/2021  ? Hypoglycemia 10/31/2018  ? Impaired functional mobility, balance, gait, and endurance 10/31/2018  ? Dilation of pancreatic duct 10/02/2018  ? Hepatitis B core antibody  positive 09/28/2018  ? 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 History of encephalopathy 09/28/2018  ? Vascular dementia without behavioral disturbance ([place])   ? History of hepatitis C   ? Hypothyroidism (acquired)   ? Gastroesophageal reflux disease   ? Aneurysm, cerebral, nonruptured   ? AMS (altered mental status) 08/17/2018  ? Gait disturbance 08/02/2018  ? Mild memory disturbance 08/02/2018  ? Hepatic encephalopathy ([place]) 07/13/2018  ? 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 CKD (chronic kidney disease), stage III ([place]) 07/13/2018  ? Cervical stenosis of spine 05/29/2018  ? Degenerative disc disease, cervical 05/29/2018  ? Iron deficiency anemia due to chronic blood loss 09/12/2017  ? Rectal bleeding 06/06/2017  ? Acute renal failure (ARF) ([place]) 06/06/2017  ? Hyponatremia 06/06/2017  ? Hyperkalemia 06/06/2017  ? Anemia 06/06/2017  ? GI bleeding 06/06/2017  ? 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 Other dental procedure status 05/22/2017  ? Family history of CHF (congestive heart failure) 02/08/17  ? Family history of sudden death 2017-02-08  ? Dyspnea 07-27-2016  ? Other disorder of circulatory system 04/12/2012  ? Phlebolithiasis 04/12/2012  ? Abnormal prostate by palpation 04/11/2012  ? Benign neoplasm of colon 04/11/2012  ? Degenerative joint disease of pelvic region 04/11/2012  ? Disorder of prostate 04/11/2012  ? Iron excess 04/11/2012  ? Lumbosacral spondylosis without myelopathy 04/11/2012  ?  Nocturia 04/11/2012  ? Orthostatic hypotension 04/11/2012  ? Osteoarthritis of both hips 04/11/2012  ? 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 Left sided sciatica 04/11/2012  ? History of colonic polyps 10/03/2011  ? ? ?REFERRING DIAG: Cervicalgia ? ?THERAPY DIAG:  ?Cervicalgia ? ?Neck stiffness ? ?Muscle weakness (generalized) ? ?Abnormal posture ? ?Difficulty in walking, not elsewhere classified ? ?PERTINENT HISTORY: OA, Cirrhosis, GERD, Hemophilia, Hepatitis C,  Hypothyroidism ? ?PRECAUTIONS: Fall ? ?SUBJECTIVE: Pt reports having second fall on his deck over the weekend tripping over a nail.  ? ?PAIN:  ?Are you having pain? Yes ?NPRS scale: 5/10 ?Pain location: General body aches from fall, elbow areas scrapped up ?Pain orientation:  ?PAIN TYPE: sore ?Pain description: intermittent  ?Aggravating factors: during movement ?Relieving factors: rest ? ? ?OBJECTIVE:  ?  ?DIAGNOSTIC FINDINGS:  ?No new imaging ?  ?PATIENT SURVEYS:  ?FOTO 46% (59% projected by visit 11) ?  FOTO on 01/24/2022:  49% ?  ?  ?COGNITION: ?Overall cognitive status: Within functional limits for tasks assessed ?  ?POSTURE:  ?Forward head, kyphotic thoracic spine ?  ?CERVICAL AROM/PROM ?  ?A/PROM A/ROM (deg) ?12/22/2021 A/ROM ?01/24/22 A/ROM ?02/09/22  ?Flexion 45  45  ?Extension '10 10 15  '$ ?Right lateral flexion 12  20  ?Left lateral flexion 10  20  ?Right rotation 40 30 40  ?Left rotation 40 30 40  ? (Blank rows = not tested) ?  ?UE MMT: ?Bilateral UE grossly 4 to 4+/5 throughout ?R hip strength of 4/5, L hip strength of grossly 4+ to 5/5 throughout ?  ?FUNCTIONAL TEST ? ?02/09/2022: ?5 times sit to stand: 14.6 sec with UE use ?Timed up and go (TUG): 18.4 sec without assistive device ? ?01/24/2022: ?Timed up and go (TUG): 16.1 sec without assistive device ? ?01/18/2022: ?Timed up and go (TUG): 16.1 sec without assistive device ? ?12/22/2021: ?5 times sit to stand: 15.9 sec with UE use ?Timed up and go (TUG): 16 sec without assistive device ?  ?  ?  ?TODAY'S TREATMENT:  ? ? 02/28/22:  ?Nustep L5 x 10 min (538 steps). PTA present to discuss status ?Doorway pec stretch 3x20 sec ?Cervical AROM 10x each direction: pink ball behind pt. ?Cane lifts to 90 degrees 2x10, VC to lean back into the ball more.  ? Seated thoracic ext: pt pushes down into the cane and lift up 2x10 ?Ankle toe raises 2x10 seated ?Arm bike reverse: L2 3 min Vc to look up at numbers every now and then ?Side step over low hurdle 10x Bil ?Standing heel raises  10x at counter top ?  ?02/23/2022: ?Nustep L5 x59mn (538 steps). PT present to discuss status ?Sit to/from stand holding 5# kettlebell:  with chest press 2x10 ?Seated:  chin tucks x20.  Shoulder flexion, scaption, shoulder ER, horizontal abduction, and shoulder diagonals with red tband.  2x10 each bilat. Pink ball at thoracic. ?In standing:  cervical extension, back extension.  X20 each ?Doorway pec stretch 2x20 sec ?Wall push ups 2x10 ? ?02/16/2022: ?Nustep L5 x736m. PT present to discuss status ?Seated:  chin tucks x20.  Shoulder flexion, scaption, shoulder ER, horizontal abduction, and shoulder diagonals with red tband.  2x10 each bilat. Pink ball at thoracic. ?Thoracic extension sitting against ball 2x10: 2nd set hold 3 sec  ?Sitting cervical extension A/ROM 2x10; hold 3 sec ?Seated rows, shoulder extension, and bicep curls 2x10 with blue theraband ?Sit to stand holding 2# with chest press x10 ?Wall push ups 2x10 ? ?02/14/2022: ?Nustep L5  x28mn. PT present to discuss status ?Seated:  chin tucks x20.  Shoulder flexion, scaption, shoulder ER, horizontal abduction with red tband.  2x10 each bilat. Pink ball at thoracic. ?Sitting cervical extension A/ROM 2x10; hold 3 sec ?Thoracic extension sitting against ball 2x10: 2nd set hold 3 sec  ?Seated rows, shoulder extension, and bicep curls 2x10 with blue theraband ? ?02/09/2022: ?Nustep L5 x757m. PT present to discuss status ?Seated:  chin tucks x20.  Shoulder flexion, scaption, shoulder ER, horizontal abduction with red tband.  2x10 each bilat. Pink ball at thoracic. ?Seated rows and shoulder extension 2x10 with blue theraband ?Thoracic extension sitting against ball 2x10: 2nd set hold 3 sec  ?Sitting cervical extension A/ROM 2x10; hold 3 sec ? ? ?PATIENT EDUCATION:  ?Education details: Pt educated in posture. ?Person educated: Patient ?Education method: Explanation ?Education comprehension: verbalized understanding ?  ?  ?HOME EXERCISE PROGRAM: ?Access Code: [email or web address]:  [email or web address] ?Date: 12/29/2021 ?Prepared by: ShJuel Burrow ?Exercises ?Supine Cervical Retraction with Towel - 1-2 x daily - 7 x weekly - 2 sets - 10 reps - 2 sec hold ?Seated Scapular Retraction - 2 x daily

## 2022-03-02 ENCOUNTER — Encounter: Payer: Self-pay | Admitting: Rehabilitative and Restorative Service Providers"

## 2022-03-02 ENCOUNTER — Ambulatory Visit: Payer: Medicare Other | Admitting: Rehabilitative and Restorative Service Providers"

## 2022-03-02 DIAGNOSIS — R262 Difficulty in walking, not elsewhere classified: Secondary | ICD-10-CM

## 2022-03-02 DIAGNOSIS — M6281 Muscle weakness (generalized): Secondary | ICD-10-CM

## 2022-03-02 DIAGNOSIS — R293 Abnormal posture: Secondary | ICD-10-CM

## 2022-03-02 DIAGNOSIS — M436 Torticollis: Secondary | ICD-10-CM

## 2022-03-02 DIAGNOSIS — M542 Cervicalgia: Secondary | ICD-10-CM | POA: Diagnosis not present

## 2022-03-02 DIAGNOSIS — M25551 Pain in right hip: Secondary | ICD-10-CM

## 2022-03-02 NOTE — Therapy (Signed)
?OUTPATIENT PHYSICAL THERAPY TREATMENT AND PROGRESS NOTE ? ? ?Patient Name: Justin Hampton ?MRN: [id number] ?DOB:06-11-1946, 76 y.o., male ?Today's Date: 03/02/2022 ? ?PCP: Bartholome Bill, MD ?REFERRING PROVIDER: Eldridge Abrahams, NP ? ?Progress Note ?Reporting Period 12/22/2021 to 03/02/2022 ? ?See note below for Objective Data and Assessment of Progress/Goals.  ? ?  ? ? PT End of Session - 03/02/22 1106   ? ? Visit Number 19   ? Date for PT Re-Evaluation 04/08/22   ? Authorization Type UHC Medicare   ? PT Start Time 1100   ? PT Stop Time 1140   ? PT Time Calculation (min) 40 min   ? Activity Tolerance Patient tolerated treatment well   ? Behavior During Therapy [facility] for tasks assessed/performed   ? ?  ?  ? ?  ? ? ? ? ?Past Medical History:  ?Diagnosis Date  ? Adenomatous colon polyp   ? Arthritis   ? fingers  ? 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 Cirrhosis ([place])   ? Diverticulosis   ? GERD (gastroesophageal reflux disease)   ? 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 Hepatic encephalopathy ([place]) 04/12/2012  ? Hepatitis C   ? treated  ? Hypoglycemia   ? Hypothyroidism   ? ?Past Surgical History:  ?Procedure Laterality Date  ? APPENDECTOMY    ? CARPAL TUNNEL RELEASE Bilateral   ? COLONOSCOPY    ? ESOPHAGOGASTRODUODENOSCOPY    ? x 2  ? ESOPHAGOGASTRODUODENOSCOPY (EGD) WITH PROPOFOL N/A 10/29/2018  ? Procedure: ESOPHAGOGASTRODUODENOSCOPY (EGD) WITH PROPOFOL;  Surgeon: Rush Landmark [name], MD;  Location: [place];  Service: Gastroenterology;  Laterality: N/A;  ? EUS N/A 10/29/2018  ? Procedure: UPPER ENDOSCOPIC ULTRASOUND (EUS) RADIAL;  Surgeon: Rush Landmark [name], MD;  Location: [place];  Service: Gastroenterology;  Laterality: N/A;  ? ?Patient Active Problem List  ? Diagnosis Date Noted  ? Hypotension 04/01/2021  ? Lactic acidosis 04/01/2021  ? Loose stools 04/01/2021  ? AKI (acute kidney injury) ([place]) 04/01/2021  ? Syncope 04/01/2021  ? Near syncope 03/31/2021  ? Hypoglycemia  10/31/2018  ? Impaired functional mobility, balance, gait, and endurance 10/31/2018  ? Dilation of pancreatic duct 10/02/2018  ? 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  ? 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 History of encephalopathy 09/28/2018  ? Vascular dementia without behavioral disturbance ([place])   ? History of hepatitis C   ? Hypothyroidism (acquired)   ? Gastroesophageal reflux disease   ? Aneurysm, cerebral, nonruptured   ? AMS (altered mental status) 08/17/2018  ? Gait disturbance 08/02/2018  ? Mild memory disturbance 08/02/2018  ? Hepatic encephalopathy ([place]) 07/13/2018  ? 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 CKD (chronic kidney disease), stage III ([place]) 07/13/2018  ? Cervical stenosis of spine 05/29/2018  ? Degenerative disc disease, cervical 05/29/2018  ? Iron deficiency anemia due to chronic blood loss 09/12/2017  ? Rectal bleeding 06/06/2017  ? Acute renal failure (ARF) ([place]) 06/06/2017  ? Hyponatremia 06/06/2017  ? Hyperkalemia 06/06/2017  ? Anemia 06/06/2017  ? GI bleeding 06/06/2017  ? 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 Other dental procedure status 05/22/2017  ? Family history of CHF (congestive heart failure) 2017-01-26  ? Family history of sudden death Jan 26, 2017  ? Dyspnea 07/14/2016  ? Other disorder of circulatory system 04/12/2012  ? Phlebolithiasis 04/12/2012  ? Abnormal prostate by palpation 04/11/2012  ? Benign neoplasm of colon 04/11/2012  ?  Degenerative joint disease of pelvic region 04/11/2012  ? Disorder of prostate 04/11/2012  ? Iron excess 04/11/2012  ? Lumbosacral spondylosis without myelopathy 04/11/2012  ? Nocturia 04/11/2012  ? Orthostatic hypotension 04/11/2012  ? Osteoarthritis of both hips 04/11/2012  ? 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 Left sided sciatica 04/11/2012  ? History of colonic polyps 10/03/2011  ? ? ?REFERRING DIAG: Cervicalgia ? ?THERAPY DIAG:  ?Cervicalgia ? ?Neck stiffness ? ?Muscle weakness (generalized) ? ?Abnormal  posture ? ?Difficulty in walking, not elsewhere classified ? ?Pain in right hip ? ?PERTINENT HISTORY: OA, Cirrhosis, GERD, Hemophilia, Hepatitis C, Hypothyroidism ? ?PRECAUTIONS: Fall ? ?SUBJECTIVE: Pt reports still having some soreness from his fall where he tripped over the nail in his deck that was not nailed down properly. ? ?PAIN:  ?Are you having pain? Yes ?NPRS scale: 3-4/10 ?Pain location: General body aches from fall, elbow areas scrapped up ?Pain orientation:  ?PAIN TYPE: sore ?Pain description: intermittent  ?Aggravating factors: during movement ?Relieving factors: rest ? ? ?OBJECTIVE:  ?  ?DIAGNOSTIC FINDINGS:  ?No new imaging ?  ?PATIENT SURVEYS:  ?FOTO 46% (59% projected by visit 11) ?  FOTO on 01/24/2022:  49% ?FOTO on 03/02/2022:  60% ?  ?  ?COGNITION: ?Overall cognitive status: Within functional limits for tasks assessed ?  ?POSTURE:  ?Forward head, kyphotic thoracic spine ?  ?CERVICAL AROM/PROM ?  ?A/PROM A/ROM (deg) ?12/22/2021 A/ROM ?01/24/22 A/ROM ?02/09/22 A/ROM ?03/02/22  ?Flexion 45  45 45  ?Extension '10 10 15 15  '$ ?Right lateral flexion '12  20 20  '$ ?Left lateral flexion '10  20 20  '$ ?Right rotation 40 30 40   ?Left rotation 40 30 40   ? (Blank rows = not tested) ?  ?UE MMT: ?Bilateral UE grossly 4 to 4+/5 throughout ?R hip strength of 4/5, L hip strength of grossly 4+ to 5/5 throughout ?  ?FUNCTIONAL TEST ? ?02/09/2022: ?5 times sit to stand: 14.6 sec with UE use ?Timed up and go (TUG): 18.4 sec without assistive device ? ?01/24/2022: ?Timed up and go (TUG): 16.1 sec without assistive device ? ?01/18/2022: ?Timed up and go (TUG): 16.1 sec without assistive device ? ?12/22/2021: ?5 times sit to stand: 15.9 sec with UE use ?Timed up and go (TUG): 16 sec without assistive device ?  ?  ?  ?TODAY'S TREATMENT:  ? ? 03/02/2022: ?Nustep L5 x 8 min (581 steps). PTA present to discuss status ?Seated:  Shoulder flexion and chest press with cane with 1#  2x10, VC to lean back into the ball more ?Cervical A/ROM extension  and bilat rotation 2x10 each with pink ball behind pt ?Sit to/from stand x10 reps ?Standing at barre:  heel raises, hip abduction, hip extension.  BLE 2x10 ? ?02/28/22:  ?Nustep L5 x 10 min (538 steps). PTA present to discuss status ?Doorway pec stretch 3x20 sec ?Cervical AROM 10x each direction: pink ball behind pt. ?Cane lifts to 90 degrees 2x10, VC to lean back into the ball more.  ? Seated thoracic ext: pt pushes down into the cane and lift up 2x10 ?Ankle toe raises 2x10 seated ?Arm bike reverse: L2 3 min Vc to look up at numbers every now and then ?Side step over low hurdle 10x Bil ?Standing heel raises 10x at counter top ?  ?02/23/2022: ?Nustep L5 x42mn (538 steps). PT present to discuss status ?Sit to/from stand holding 5# kettlebell:  with chest press 2x10 ?Seated:  chin tucks x20.  Shoulder flexion, scaption, shoulder ER, horizontal  abduction, and shoulder diagonals with red tband.  2x10 each bilat. Pink ball at thoracic. ?In standing:  cervical extension, back extension.  X20 each ?Doorway pec stretch 2x20 sec ?Wall push ups 2x10 ? ? ? ?PATIENT EDUCATION:  ?Education details: Pt educated in posture. ?Person educated: Patient ?Education method: Explanation ?Education comprehension: verbalized understanding ?  ?  ?HOME EXERCISE PROGRAM: ?Access Code: [email or web address] ?URL: [email or web address] ?Date: 12/29/2021 ?Prepared by: Juel Burrow ? ?Exercises ?Supine Cervical Retraction with Towel - 1-2 x daily - 7 x weekly - 2 sets - 10 reps - 2 sec hold ?Seated Scapular Retraction - 2 x daily - 7 x weekly - 2 sets - 10 reps ?Seated Cervical Rotation AROM - 2 x daily - 7 x weekly - 2 sets - 10 reps ?Seated Cervical Extension AROM - 2 x daily - 7 x weekly - 2 sets - 10 reps ?Seated Overhead Reach Stretch - 2 x daily - 7 x weekly - 2 sets - 10 reps ?Shoulder External Rotation and Scapular Retraction with Resistance - 1-2 x daily - 7 x weekly - 2 sets - 10 reps ?Standing Shoulder Horizontal Abduction with  Resistance - 1-2 x daily - 7 x weekly - 2 sets - 10 reps ?Standing Shoulder Single Arm PNF D2 Flexion with Resistance - 1-2 x daily - 7 x weekly - 1 sets - 10 reps ?Seated Thoracic Extension with Hands Behind Neck - 1

## 2022-03-07 ENCOUNTER — Ambulatory Visit: Payer: Medicare Other | Admitting: Physical Therapy

## 2022-03-07 ENCOUNTER — Encounter: Payer: Self-pay | Admitting: Physical Therapy

## 2022-03-07 DIAGNOSIS — R293 Abnormal posture: Secondary | ICD-10-CM

## 2022-03-07 DIAGNOSIS — M436 Torticollis: Secondary | ICD-10-CM

## 2022-03-07 DIAGNOSIS — M542 Cervicalgia: Secondary | ICD-10-CM | POA: Diagnosis not present

## 2022-03-07 DIAGNOSIS — R262 Difficulty in walking, not elsewhere classified: Secondary | ICD-10-CM

## 2022-03-07 DIAGNOSIS — M6281 Muscle weakness (generalized): Secondary | ICD-10-CM

## 2022-03-07 NOTE — Therapy (Signed)
?OUTPATIENT PHYSICAL THERAPY TREATMENT AND PROGRESS NOTE ? ? ?Patient Name: Justin Hampton ?MRN: [id number] ?DOB:03/19/1946, 76 y.o., male ?Today's Date: 03/07/2022 ? ?PCP: Bartholome Bill, MD ?REFERRING PROVIDER: Eldridge Abrahams, NP ? ?Progress Note ?Reporting Period 12/22/2021 to 03/02/2022 ? ?See note below for Objective Data and Assessment of Progress/Goals.  ? ?  ? ? PT End of Session - 03/07/22 1153   ? ? Visit Number 20   ? Date for PT Re-Evaluation 04/08/22   ? Authorization Type UHC Medicare   ? PT Start Time 1143   ? PT Stop Time 1223   ? PT Time Calculation (min) 40 min   ? ?  ?  ? ?  ? ? ? ? ? ?Past Medical History:  ?Diagnosis Date  ? Adenomatous colon polyp   ? Arthritis   ? fingers  ? 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 Cirrhosis ([place])   ? Diverticulosis   ? GERD (gastroesophageal reflux disease)   ? 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 Hepatic encephalopathy ([place]) 04/12/2012  ? Hepatitis C   ? treated  ? Hypoglycemia   ? Hypothyroidism   ? ?Past Surgical History:  ?Procedure Laterality Date  ? APPENDECTOMY    ? CARPAL TUNNEL RELEASE Bilateral   ? COLONOSCOPY    ? ESOPHAGOGASTRODUODENOSCOPY    ? x 2  ? ESOPHAGOGASTRODUODENOSCOPY (EGD) WITH PROPOFOL N/A 10/29/2018  ? Procedure: ESOPHAGOGASTRODUODENOSCOPY (EGD) WITH PROPOFOL;  Surgeon: Rush Landmark [name], MD;  Location: [place];  Service: Gastroenterology;  Laterality: N/A;  ? EUS N/A 10/29/2018  ? Procedure: UPPER ENDOSCOPIC ULTRASOUND (EUS) RADIAL;  Surgeon: Rush Landmark [name], MD;  Location: [place];  Service: Gastroenterology;  Laterality: N/A;  ? ?Patient Active Problem List  ? Diagnosis Date Noted  ? Hypotension 04/01/2021  ? Lactic acidosis 04/01/2021  ? Loose stools 04/01/2021  ? AKI (acute kidney injury) ([place]) 04/01/2021  ? Syncope 04/01/2021  ? Near syncope 03/31/2021  ? Hypoglycemia 10/31/2018  ? Impaired functional mobility, balance, gait, and endurance 10/31/2018  ? Dilation of pancreatic duct  10/02/2018  ? 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  ? 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 History of encephalopathy 09/28/2018  ? Vascular dementia without behavioral disturbance ([place])   ? History of hepatitis C   ? Hypothyroidism (acquired)   ? Gastroesophageal reflux disease   ? Aneurysm, cerebral, nonruptured   ? AMS (altered mental status) 08/17/2018  ? Gait disturbance 08/02/2018  ? Mild memory disturbance 08/02/2018  ? Hepatic encephalopathy ([place]) 07/13/2018  ? 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 CKD (chronic kidney disease), stage III ([place]) 07/13/2018  ? Cervical stenosis of spine 05/29/2018  ? Degenerative disc disease, cervical 05/29/2018  ? Iron deficiency anemia due to chronic blood loss 09/12/2017  ? Rectal bleeding 06/06/2017  ? Acute renal failure (ARF) ([place]) 06/06/2017  ? Hyponatremia 06/06/2017  ? Hyperkalemia 06/06/2017  ? Anemia 06/06/2017  ? GI bleeding 06/06/2017  ? 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 Other dental procedure status 05/22/2017  ? Family history of CHF (congestive heart failure) 01-24-2017  ? Family history of sudden death 2017-01-24  ? Dyspnea July 12, 2016  ? Other disorder of circulatory system 04/12/2012  ? Phlebolithiasis 04/12/2012  ? Abnormal prostate by palpation 04/11/2012  ? Benign neoplasm of colon 04/11/2012  ? Degenerative joint disease of pelvic region 04/11/2012  ? Disorder of prostate 04/11/2012  ? Iron excess 04/11/2012  ?  Lumbosacral spondylosis without myelopathy 04/11/2012  ? Nocturia 04/11/2012  ? Orthostatic hypotension 04/11/2012  ? Osteoarthritis of both hips 04/11/2012  ? 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 Left sided sciatica 04/11/2012  ? History of colonic polyps 10/03/2011  ? ? ?REFERRING DIAG: Cervicalgia ? ?THERAPY DIAG:  ?Cervicalgia ? ?Neck stiffness ? ?Muscle weakness (generalized) ? ?Abnormal posture ? ?Difficulty in walking, not elsewhere classified ? ?PERTINENT HISTORY: OA, Cirrhosis, GERD, Hemophilia,  Hepatitis C, Hypothyroidism ? ?PRECAUTIONS: Fall ? ?SUBJECTIVE: Pt reports still having some soreness from his fall where he tripped over the nail in his deck that was not nailed down properly. ? ?PAIN:  ?Are you having pain? No ?NPRS scale: 10 ?Pain location: General body aches from fall, elbow areas scrapped up ?Pain orientation:  ?PAIN TYPE: sore ?Pain description: intermittent  ?Aggravating factors: during movement ?Relieving factors: rest ? ? ?OBJECTIVE:  ?  ?DIAGNOSTIC FINDINGS:  ?No new imaging ?  ?PATIENT SURVEYS:  ?FOTO 46% (59% projected by visit 11) ?  FOTO on 01/24/2022:  49% ?FOTO on 03/02/2022:  60% ?  ?  ?COGNITION: ?Overall cognitive status: Within functional limits for tasks assessed ?  ?POSTURE:  ?Forward head, kyphotic thoracic spine ?  ?CERVICAL AROM/PROM ?  ?A/PROM A/ROM (deg) ?12/22/2021 A/ROM ?01/24/22 A/ROM ?02/09/22 A/ROM ?03/02/22  ?Flexion 45  45 45  ?Extension '10 10 15 15  '$ ?Right lateral flexion '12  20 20  '$ ?Left lateral flexion '10  20 20  '$ ?Right rotation 40 30 40   ?Left rotation 40 30 40   ? (Blank rows = not tested) ?  ?UE MMT: ?Bilateral UE grossly 4 to 4+/5 throughout ?R hip strength of 4/5, L hip strength of grossly 4+ to 5/5 throughout ?  ?FUNCTIONAL TEST ? ?02/09/2022: ?5 times sit to stand: 14.6 sec with UE use ?Timed up and go (TUG): 18.4 sec without assistive device ? ?01/24/2022: ?Timed up and go (TUG): 16.1 sec without assistive device ? ?01/18/2022: ?Timed up and go (TUG): 16.1 sec without assistive device ? ?12/22/2021: ?5 times sit to stand: 15.9 sec with UE use ?Timed up and go (TUG): 16 sec without assistive device ?  ?  ?  ?TODAY'S TREATMENT:  ? ? 03/07/22: ?Nustep L5 x 10 min (581 steps). PTA present to discuss status ?Seated:  Shoulder flexion and chest press with cane with 1.5#  2x10, VC to lean back into the ball more, then PTA added red band to his cane for rows 2x10 ?Cervical A/ROM extension and bilat rotation 2x10 each with pink ball behind pt ?Sit to/from stand x10 reps: VC to  use LE > UE ?Standing at barre:  heel raises15x, hip abduction 10x, hip extension 15x ?Side step over low hurdle 10x Bil ?Arm bike reverse: L2 3 min Vc to look up at numbers every now and then ? ? 03/02/2022: ?Nustep L5 x 8 min (581 steps). PTA present to discuss status ?Seated:  Shoulder flexion and chest press with cane with 1#  2x10, VC to lean back into the ball more ?Cervical A/ROM extension and bilat rotation 2x10 each with pink ball behind pt ?Sit to/from stand x10 reps ?Standing at barre:  heel raises, hip abduction, hip extension.  BLE 2x10 ? ?02/28/22:  ?Nustep L5 x 10 min (538 steps). PTA present to discuss status ?Doorway pec stretch 3x20 sec ?Cervical AROM 10x each direction: pink ball behind pt. ?Cane lifts to 90 degrees 2x10, VC to lean back into the ball more.  ? Seated thoracic ext:  pt pushes down into the cane and lift up 2x10 ?Ankle toe raises 2x10 seated ?Arm bike reverse: L2 3 min Vc to look up at numbers every now and then ?Side step over low hurdle 10x Bil ?Standing heel raises 10x at counter top ?  ? ? ? ?PATIENT EDUCATION:  ?Education details: Pt educated in posture. ?Person educated: Patient ?Education method: Explanation ?Education comprehension: verbalized understanding ?  ?  ?HOME EXERCISE PROGRAM: ?Access Code: [email or web address] ?URL: [email or web address] ?Date: 12/29/2021 ?Prepared by: Juel Burrow ? ?Exercises ?Supine Cervical Retraction with Towel - 1-2 x daily - 7 x weekly - 2 sets - 10 reps - 2 sec hold ?Seated Scapular Retraction - 2 x daily - 7 x weekly - 2 sets - 10 reps ?Seated Cervical Rotation AROM - 2 x daily - 7 x weekly - 2 sets - 10 reps ?Seated Cervical Extension AROM - 2 x daily - 7 x weekly - 2 sets - 10 reps ?Seated Overhead Reach Stretch - 2 x daily - 7 x weekly - 2 sets - 10 reps ?Shoulder External Rotation and Scapular Retraction with Resistance - 1-2 x daily - 7 x weekly - 2 sets - 10 reps ?Standing Shoulder Horizontal Abduction with Resistance - 1-2 x daily -  7 x weekly - 2 sets - 10 reps ?Standing Shoulder Single Arm PNF D2 Flexion with Resistance - 1-2 x daily - 7 x weekly - 1 sets - 10 reps ?Seated Thoracic Extension with Hands Behind Neck - 1 x daily - 7 x we

## 2022-03-09 ENCOUNTER — Encounter: Payer: Self-pay | Admitting: Rehabilitative and Restorative Service Providers"

## 2022-03-09 ENCOUNTER — Ambulatory Visit: Payer: Medicare Other | Admitting: Rehabilitative and Restorative Service Providers"

## 2022-03-09 DIAGNOSIS — M25551 Pain in right hip: Secondary | ICD-10-CM

## 2022-03-09 DIAGNOSIS — M542 Cervicalgia: Secondary | ICD-10-CM | POA: Diagnosis not present

## 2022-03-09 DIAGNOSIS — R293 Abnormal posture: Secondary | ICD-10-CM

## 2022-03-09 DIAGNOSIS — M6281 Muscle weakness (generalized): Secondary | ICD-10-CM

## 2022-03-09 DIAGNOSIS — M436 Torticollis: Secondary | ICD-10-CM

## 2022-03-09 DIAGNOSIS — R262 Difficulty in walking, not elsewhere classified: Secondary | ICD-10-CM

## 2022-03-09 NOTE — Therapy (Signed)
?OUTPATIENT PHYSICAL THERAPY TREATMENT AND PROGRESS NOTE ? ? ?Patient Name: Justin Hampton ?MRN: [id number] ?DOB:10-Nov-1945, 76 y.o., male ?Today's Date: 03/09/2022 ? ?PCP: Bartholome Bill, MD ?REFERRING PROVIDER: Eldridge Abrahams, NP ? ?Progress Note ?Reporting Period 12/22/2021 to 03/02/2022 ? ?See note below for Objective Data and Assessment of Progress/Goals.  ? ?  ? ? PT End of Session - 03/09/22 1150   ? ? Visit Number 21   ? Date for PT Re-Evaluation 04/08/22   ? Authorization Type UHC Medicare   ? PT Start Time 1145   ? PT Stop Time 1225   ? PT Time Calculation (min) 40 min   ? Activity Tolerance Patient tolerated treatment well   ? Behavior During Therapy [facility] for tasks assessed/performed   ? ?  ?  ? ?  ? ? ? ? ? ?Past Medical History:  ?Diagnosis Date  ? Adenomatous colon polyp   ? Arthritis   ? fingers  ? 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 Cirrhosis ([place])   ? Diverticulosis   ? GERD (gastroesophageal reflux disease)   ? 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 Hepatic encephalopathy ([place]) 04/12/2012  ? Hepatitis C   ? treated  ? Hypoglycemia   ? Hypothyroidism   ? ?Past Surgical History:  ?Procedure Laterality Date  ? APPENDECTOMY    ? CARPAL TUNNEL RELEASE Bilateral   ? COLONOSCOPY    ? ESOPHAGOGASTRODUODENOSCOPY    ? x 2  ? ESOPHAGOGASTRODUODENOSCOPY (EGD) WITH PROPOFOL N/A 10/29/2018  ? Procedure: ESOPHAGOGASTRODUODENOSCOPY (EGD) WITH PROPOFOL;  Surgeon: Rush Landmark [name], MD;  Location: [place];  Service: Gastroenterology;  Laterality: N/A;  ? EUS N/A 10/29/2018  ? Procedure: UPPER ENDOSCOPIC ULTRASOUND (EUS) RADIAL;  Surgeon: Rush Landmark [name], MD;  Location: [place];  Service: Gastroenterology;  Laterality: N/A;  ? ?Patient Active Problem List  ? Diagnosis Date Noted  ? Hypotension 04/01/2021  ? Lactic acidosis 04/01/2021  ? Loose stools 04/01/2021  ? AKI (acute kidney injury) ([place]) 04/01/2021  ? Syncope 04/01/2021  ? Near syncope 03/31/2021  ? Hypoglycemia  10/31/2018  ? Impaired functional mobility, balance, gait, and endurance 10/31/2018  ? Dilation of pancreatic duct 10/02/2018  ? 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  ? 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 History of encephalopathy 09/28/2018  ? Vascular dementia without behavioral disturbance ([place])   ? History of hepatitis C   ? Hypothyroidism (acquired)   ? Gastroesophageal reflux disease   ? Aneurysm, cerebral, nonruptured   ? AMS (altered mental status) 08/17/2018  ? Gait disturbance 08/02/2018  ? Mild memory disturbance 08/02/2018  ? Hepatic encephalopathy ([place]) 07/13/2018  ? 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 CKD (chronic kidney disease), stage III ([place]) 07/13/2018  ? Cervical stenosis of spine 05/29/2018  ? Degenerative disc disease, cervical 05/29/2018  ? Iron deficiency anemia due to chronic blood loss 09/12/2017  ? Rectal bleeding 06/06/2017  ? Acute renal failure (ARF) ([place]) 06/06/2017  ? Hyponatremia 06/06/2017  ? Hyperkalemia 06/06/2017  ? Anemia 06/06/2017  ? GI bleeding 06/06/2017  ? 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 Other dental procedure status 05/22/2017  ? Family history of CHF (congestive heart failure) 29-Jan-2017  ? Family history of sudden death 2017-01-29  ? Dyspnea 07-17-16  ? Other disorder of circulatory system 04/12/2012  ? Phlebolithiasis 04/12/2012  ? Abnormal prostate by palpation 04/11/2012  ? Benign neoplasm of colon 04/11/2012  ?  Degenerative joint disease of pelvic region 04/11/2012  ? Disorder of prostate 04/11/2012  ? Iron excess 04/11/2012  ? Lumbosacral spondylosis without myelopathy 04/11/2012  ? Nocturia 04/11/2012  ? Orthostatic hypotension 04/11/2012  ? Osteoarthritis of both hips 04/11/2012  ? 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 Left sided sciatica 04/11/2012  ? History of colonic polyps 10/03/2011  ? ? ?REFERRING DIAG: Cervicalgia ? ?THERAPY DIAG:  ?Cervicalgia ? ?Neck stiffness ? ?Muscle weakness (generalized) ? ?Abnormal  posture ? ?Difficulty in walking, not elsewhere classified ? ?Pain in right hip ? ?PERTINENT HISTORY: OA, Cirrhosis, GERD, Hemophilia, Hepatitis C, Hypothyroidism ? ?PRECAUTIONS: Fall ? ?SUBJECTIVE: Pt reports no new complaints, denies new falls ? ?PAIN:  ?Are you having pain? No ?NPRS scale: 1-2/10 ?Pain location: General body aches from fall, but mostly right hip ?Pain orientation:  ?PAIN TYPE: sore ?Pain description: intermittent  ?Aggravating factors: during movement ?Relieving factors: rest ? ? ?OBJECTIVE:  ?  ?DIAGNOSTIC FINDINGS:  ?No new imaging ?  ?PATIENT SURVEYS:  ?FOTO 46% (59% projected by visit 11) ?  FOTO on 01/24/2022:  49% ?FOTO on 03/02/2022:  60% ?  ?  ?COGNITION: ?Overall cognitive status: Within functional limits for tasks assessed ?  ?POSTURE:  ?Forward head, kyphotic thoracic spine ?  ?CERVICAL AROM/PROM ?  ?A/PROM A/ROM (deg) ?12/22/2021 A/ROM ?01/24/22 A/ROM ?02/09/22 A/ROM ?03/02/22  ?Flexion 45  45 45  ?Extension '10 10 15 15  '$ ?Right lateral flexion '12  20 20  '$ ?Left lateral flexion '10  20 20  '$ ?Right rotation 40 30 40   ?Left rotation 40 30 40   ? (Blank rows = not tested) ?  ?UE MMT: ?Bilateral UE grossly 4 to 4+/5 throughout ?R hip strength of 4/5, L hip strength of grossly 4+ to 5/5 throughout ?  ?FUNCTIONAL TEST ? ?02/09/2022: ?5 times sit to stand: 14.6 sec with UE use ?Timed up and go (TUG): 18.4 sec without assistive device ? ?01/24/2022: ?Timed up and go (TUG): 16.1 sec without assistive device ? ?01/18/2022: ?Timed up and go (TUG): 16.1 sec without assistive device ? ?12/22/2021: ?5 times sit to stand: 15.9 sec with UE use ?Timed up and go (TUG): 16 sec without assistive device ?  ?  ?  ?TODAY'S TREATMENT:  ? ? 03/09/2022: ?Nustep L5 x 6 min. PT present to discuss status ?Seated:  Shoulder flexion and chest press with cane with 1#  2x10, VC to lean back into the ball more ?Cervical A/ROM extension and bilat rotation 2x10 each with pink ball behind pt ?Sit to/from stand x10 reps: VC to use LE >  UE ?Standing at barre:  heel raises, hip abduction, hip extension.  BLE 2x10 ?Standing:  rows and shoulder extension with red tband.  2x10 each ?Fwd and lateral step ups with 4" step and UE use required.  X10 bilat ?Arm bike reverse: L2.0 x3 min with Vc to look up at numbers every now and then ? ?03/07/22: ?Nustep L5 x 10 min (581 steps). PTA present to discuss status ?Seated:  Shoulder flexion and chest press with cane with 1.5#  2x10, VC to lean back into the ball more, then PTA added red band to his cane for rows 2x10 ?Cervical A/ROM extension and bilat rotation 2x10 each with pink ball behind pt ?Sit to/from stand x10 reps: VC to use LE > UE ?Standing at barre:  heel raises15x, hip abduction 10x, hip extension 15x ?Side step over low hurdle 10x Bil ?Arm bike reverse: L2 3 min Vc to  look up at numbers every now and then ? ? 03/02/2022: ?Nustep L5 x 8 min (581 steps). PTA present to discuss status ?Seated:  Shoulder flexion and chest press with cane with 1#  2x10, VC to lean back into the ball more ?Cervical A/ROM extension and bilat rotation 2x10 each with pink ball behind pt ?Sit to/from stand x10 reps ?Standing at barre:  heel raises, hip abduction, hip extension.  BLE 2x10 ? ? ?PATIENT EDUCATION:  ?Education details: Pt educated in posture. ?Person educated: Patient ?Education method: Explanation ?Education comprehension: verbalized understanding ?  ?  ?HOME EXERCISE PROGRAM: ?Access Code: [email or web address] ?URL: [email or web address] ?Date: 12/29/2021 ?Prepared by: Juel Burrow ? ?Exercises ?Supine Cervical Retraction with Towel - 1-2 x daily - 7 x weekly - 2 sets - 10 reps - 2 sec hold ?Seated Scapular Retraction - 2 x daily - 7 x weekly - 2 sets - 10 reps ?Seated Cervical Rotation AROM - 2 x daily - 7 x weekly - 2 sets - 10 reps ?Seated Cervical Extension AROM - 2 x daily - 7 x weekly - 2 sets - 10 reps ?Seated Overhead Reach Stretch - 2 x daily - 7 x weekly - 2 sets - 10 reps ?Shoulder External  Rotation and Scapular Retraction with Resistance - 1-2 x daily - 7 x weekly - 2 sets - 10 reps ?Standing Shoulder Horizontal Abduction with Resistance - 1-2 x daily - 7 x weekly - 2 sets - 10 reps ?Standing Shoulder

## 2022-03-16 ENCOUNTER — Encounter: Payer: Self-pay | Admitting: Rehabilitative and Restorative Service Providers"

## 2022-03-16 ENCOUNTER — Ambulatory Visit: Payer: Medicare Other | Admitting: Rehabilitative and Restorative Service Providers"

## 2022-03-16 DIAGNOSIS — R262 Difficulty in walking, not elsewhere classified: Secondary | ICD-10-CM

## 2022-03-16 DIAGNOSIS — M436 Torticollis: Secondary | ICD-10-CM

## 2022-03-16 DIAGNOSIS — M542 Cervicalgia: Secondary | ICD-10-CM

## 2022-03-16 DIAGNOSIS — M25551 Pain in right hip: Secondary | ICD-10-CM

## 2022-03-16 DIAGNOSIS — R293 Abnormal posture: Secondary | ICD-10-CM

## 2022-03-16 DIAGNOSIS — M6281 Muscle weakness (generalized): Secondary | ICD-10-CM

## 2022-03-16 NOTE — Therapy (Signed)
OUTPATIENT PHYSICAL THERAPY TREATMENT   Patient Name: Justin Hampton MRN: [id number] DOB:11/27/1945, 76 y.o., male Today's Date: 03/16/2022  PCP: Bartholome Bill, MD REFERRING PROVIDER: Eldridge Abrahams, NP      PT End of Session - 03/16/22 1103     Visit Number 22    Date for PT Re-Evaluation 04/08/22    Authorization Type UHC Medicare    PT Start Time 1100    PT Stop Time 1140    PT Time Calculation (min) 40 min    Activity Tolerance Patient tolerated treatment well    Behavior During Therapy WFL for tasks assessed/performed                Past Medical History:  Diagnosis Date   Adenomatous colon polyp    Arthritis    fingers   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   Cirrhosis ([place])    Diverticulosis    GERD (gastroesophageal reflux disease)    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[place])    Hemophilia B   Hepatic encephalopathy ([place]) 04/12/2012   Hepatitis C    treated   Hypoglycemia    Hypothyroidism    Past Surgical History:  Procedure Laterality Date   APPENDECTOMY     CARPAL TUNNEL RELEASE Bilateral    COLONOSCOPY     ESOPHAGOGASTRODUODENOSCOPY     x 2   ESOPHAGOGASTRODUODENOSCOPY (EGD) WITH PROPOFOL N/A 10/29/2018   Procedure: ESOPHAGOGASTRODUODENOSCOPY (EGD) WITH PROPOFOL;  Surgeon: Irving Copas., MD;  Location: [facility] [place];  Service: Gastroenterology;  Laterality: N/A;   EUS N/A 10/29/2018   Procedure: UPPER ENDOSCOPIC ULTRASOUND (EUS) RADIAL;  Surgeon: Irving Copas., MD;  Location: [facility];  Service: Gastroenterology;  Laterality: N/A;   Patient Active Problem List   Diagnosis Date Noted   Hypotension 04/01/2021   Lactic acidosis 04/01/2021   Loose stools 04/01/2021   AKI (acute kidney injury) ([place]) 04/01/2021   Syncope 04/01/2021   Near syncope 03/31/2021   Hypoglycemia 10/31/2018   Impaired functional mobility, balance, gait, and endurance 10/31/2018   Dilation of pancreatic duct 10/02/2018   Hepatitis B core  antibody positive 09/28/2018   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[place]) 09/28/2018   History of encephalopathy 09/28/2018   Vascular dementia without behavioral disturbance ([place])    History of hepatitis C    Hypothyroidism (acquired)    Gastroesophageal reflux disease    Aneurysm, cerebral, nonruptured    AMS (altered mental status) 08/17/2018   Gait disturbance 08/02/2018   Mild memory disturbance 08/02/2018   Hepatic encephalopathy ([place]) 07/13/2018   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[place]) 07/13/2018   CKD (chronic kidney disease), stage III ([place]) 07/13/2018   Cervical stenosis of spine 05/29/2018   Degenerative disc disease, cervical 05/29/2018   Iron deficiency anemia due to chronic blood loss 09/12/2017   Rectal bleeding 06/06/2017   Acute renal failure (ARF) ([place]) 06/06/2017   Hyponatremia 06/06/2017   Hyperkalemia 06/06/2017   Anemia 06/06/2017   GI bleeding 06/06/2017   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[place])    Other dental procedure status 05/22/2017   Family history of CHF (congestive heart failure) 02-11-2017   Family history of sudden death 2017-02-11   Dyspnea 2016/07/30   Other disorder of circulatory system 04/12/2012   Phlebolithiasis 04/12/2012   Abnormal prostate by palpation 04/11/2012   Benign neoplasm of colon 04/11/2012   Degenerative joint disease of pelvic region 04/11/2012   Disorder of prostate 04/11/2012   Iron excess 04/11/2012   Lumbosacral spondylosis  without myelopathy 04/11/2012   Nocturia 04/11/2012   Orthostatic hypotension 04/11/2012   Osteoarthritis of both hips 04/11/2012   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   Left sided sciatica 04/11/2012   History of colonic polyps 10/03/2011    REFERRING DIAG: Cervicalgia  THERAPY DIAG:  Cervicalgia  Neck stiffness  Muscle weakness (generalized)  Abnormal posture  Difficulty in walking, not elsewhere classified  Pain in right hip  PERTINENT HISTORY: OA, Cirrhosis, GERD, Hemophilia, Hepatitis  C, Hypothyroidism  PRECAUTIONS: Fall  SUBJECTIVE: Pt denies any new falls, states that he is having someone give him quotes to get a new deck installed to prevent tripping again on his older deck.  PAIN:  Are you having pain? No NPRS scale: 1-2/10 Pain location: General body aches from fall, but mostly right hip Pain orientation:  PAIN TYPE: sore Pain description: intermittent  Aggravating factors: during movement Relieving factors: rest   OBJECTIVE:    DIAGNOSTIC FINDINGS:  No new imaging   PATIENT SURVEYS:  FOTO 46% (59% projected by visit 11)   FOTO on 01/24/2022:  49% FOTO on 03/02/2022:  60%     COGNITION: Overall cognitive status: Within functional limits for tasks assessed   POSTURE:  Forward head, kyphotic thoracic spine   CERVICAL AROM/PROM   A/PROM A/ROM (deg) 12/22/2021 A/ROM 01/24/22 A/ROM 02/09/22 A/ROM 03/02/22  Flexion 45  45 45  Extension _0 Right lateral flexion _1 Left lateral flexion _2 Right rotation 40 30 40   Left rotation 40 30 40    (Blank rows = not tested)   UE MMT: Bilateral UE grossly 4 to 4+/5 throughout R hip strength of 4/5, L hip strength of grossly 4+ to 5/5 throughout   FUNCTIONAL TEST  03/16/2022: Timed up and go (TUG): 15.1 sec without assistive device  02/09/2022: 5 times sit to stand: 14.6 sec with UE use Timed up and go (TUG): 18.4 sec without assistive device  01/24/2022: Timed up and go (TUG): 16.1 sec without assistive device  01/18/2022: Timed up and go (TUG): 16.1 sec without assistive device  12/22/2021: 5 times sit to stand: 15.9 sec with UE use Timed up and go (TUG): 16 sec without assistive device       TODAY'S TREATMENT:    03/16/2022: Nustep L5 x 7 min (518 steps). PT present to discuss status Cervical A/ROM extension and bilat rotation 2x10 each with pink ball behind pt Seated:  Shoulder flexion and chest press with cane with 1#  2x10, VC to lean back into the ball more Sit  to/from stand x10 reps with UE use required TUG 15.1 sec without assistive device Ambulation down PT hallway and back x2 laps with seated recovery period in between trials Standing:  rows and shoulder extension with green tband.  2x10 each  03/09/2022: Nustep L5 x 6 min. PT present to discuss status Seated:  Shoulder flexion and chest press with cane with 1#  2x10, VC to lean back into the ball more Cervical A/ROM extension and bilat rotation 2x10 each with pink ball behind pt Sit to/from stand x10 reps: VC to use LE > UE Standing at barre:  heel raises, hip abduction, hip extension.  BLE 2x10 Standing:  rows and shoulder extension with red tband.  2x10 each Fwd and lateral step ups with 4" step and UE use required.  X10 bilat Arm bike reverse: L2.0 x3 min with Vc to look up at numbers every  now and then  03/07/22: Nustep L5 x 10 min (581 steps). PTA present to discuss status Seated:  Shoulder flexion and chest press with cane with 1.5#  2x10, VC to lean back into the ball more, then PTA added red band to his cane for rows 2x10 Cervical A/ROM extension and bilat rotation 2x10 each with pink ball behind pt Sit to/from stand x10 reps: VC to use LE > UE Standing at barre:  heel raises15x, hip abduction 10x, hip extension 15x Side step over low hurdle 10x Bil Arm bike reverse: L2 3 min Vc to look up at numbers every now and then   PATIENT EDUCATION:  Education details: Pt educated in posture. Person educated: Patient Education method: Explanation Education comprehension: verbalized understanding     HOME EXERCISE PROGRAM: Access Code: [email or web address] URL: [email or web address] Date: 12/29/2021 Prepared by: Shelby Dubin Taleeyah Bora  Exercises Supine Cervical Retraction with Towel - 1-2 x daily - 7 x weekly - 2 sets - 10 reps - 2 sec hold Seated Scapular Retraction - 2 x daily - 7 x weekly - 2 sets - 10 reps Seated Cervical Rotation AROM - 2 x daily - 7 x weekly - 2 sets - 10  reps Seated Cervical Extension AROM - 2 x daily - 7 x weekly - 2 sets - 10 reps Seated Overhead Reach Stretch - 2 x daily - 7 x weekly - 2 sets - 10 reps Shoulder External Rotation and Scapular Retraction with Resistance - 1-2 x daily - 7 x weekly - 2 sets - 10 reps Standing Shoulder Horizontal Abduction with Resistance - 1-2 x daily - 7 x weekly - 2 sets - 10 reps Standing Shoulder Single Arm PNF D2 Flexion with Resistance - 1-2 x daily - 7 x weekly - 1 sets - 10 reps Seated Thoracic Extension with Hands Behind Neck - 1 x daily - 7 x weekly - 2 sets - 10 reps Standing Shoulder Flexion with Resistance - 1 x daily - 7 x weekly - 2 sets - 10 reps Standing Single Arm Shoulder Abduction with Resistance - 1 x daily - 7 x weekly - 2 sets - 10 reps Shoulder extension with resistance - Neutral - 1 x daily - 7 x weekly - 2 sets - 10 reps Standing Shoulder Row with Anchored Resistance - 1 x daily - 7 x weekly - 2 sets - 10 reps    ASSESSMENT:   CLINICAL IMPRESSION: Mr Spirito presents to PT for skilled rehabilitation continuing to report improvements.  Pt was able to demonstrate improved time on TUG and able to ambulate in clinic without assistive device.  Pt continues to require cuing throughout for improved upright posture and decreased forward head.  Pt continues to require skilled PT to progress towards goal related activities and improved balance/decreased fall risk.  OBJECTIVE IMPAIRMENTS decreased balance, decreased ROM, decreased strength, impaired UE functional use, improper body mechanics, postural dysfunction, and pain.    ACTIVITY LIMITATIONS cleaning, community activity, driving, and meal prep.    PERSONAL FACTORS Age and 3+ comorbidities: OA, GERD, Cirrhosis  are also affecting patient's functional outcome.      REHAB POTENTIAL: Good   CLINICAL DECISION MAKING: Evolving/moderate complexity   EVALUATION COMPLEXITY: Moderate     GOALS: Goals reviewed with patient? Yes   SHORT  TERM GOALS:   STG Name Target Date Goal status  1 Pt will be independent with initial HEP. Baseline:  01/12/2022 Met  12/31/2021  2 Pt to report a 40% improvement  in symptoms since starting PT. Baseline:  01/19/2022 Met 01/12/2022    LONG TERM GOALS:    LTG Name Target Date Goal status  1 Pt to be independent with advanced HEP. Baseline: 04/08/2022 ONGOING  2 Pt to increase FOTO to 59% to demonstrate his increased ability to perform activities in his home. Baseline: 46% 04/08/2022 GOAL MET  3 Pt to increase BUE strength to at least 4+/5 to allow him to lift items in home. Baseline: 04/08/2022 ONGOING  4 Pt to increase cervical A/ROM by at least 10 degrees to allow him the ability to perform overhead tasks. Baseline: 04/08/2022 ONGOING  5 Pt will increase R hip strength to at least 4+/5 to allow him to perform sit to/from transfers more easily. 04/08/2022 ONGOING  6 Pt will be able to bend down to pick up objects from the floor without increased pain or falling. 04/08/2022 ONGOING    PLAN: PT FREQUENCY: 2x/week   PT DURATION: 8 weeks   PLANNED INTERVENTIONS: Therapeutic exercises, Therapeutic activity, Neuromuscular re-education, Balance training, Gait training, Patient/Family education, Joint manipulation, Joint mobilization, Aquatic Therapy, Dry Needling, Electrical stimulation, Spinal manipulation, Spinal mobilization, Cryotherapy, Moist heat, Taping, Traction, Ultrasound, Ionotophoresis 35m/ml Dexamethasone, and Manual therapy   PLAN FOR NEXT SESSION:  balance, continue with upper quarter strength/posture    SJuel Burrow PT, DPT 03/16/22 11:47 AM    [facility] [address] [place] [address]Phone # [phone] [phone]

## 2022-03-23 ENCOUNTER — Ambulatory Visit: Payer: Medicare Other | Admitting: Rehabilitative and Restorative Service Providers"

## 2022-03-23 ENCOUNTER — Encounter: Payer: Self-pay | Admitting: Rehabilitative and Restorative Service Providers"

## 2022-03-23 DIAGNOSIS — M6281 Muscle weakness (generalized): Secondary | ICD-10-CM

## 2022-03-23 DIAGNOSIS — M542 Cervicalgia: Secondary | ICD-10-CM | POA: Diagnosis not present

## 2022-03-23 DIAGNOSIS — R262 Difficulty in walking, not elsewhere classified: Secondary | ICD-10-CM

## 2022-03-23 DIAGNOSIS — M25551 Pain in right hip: Secondary | ICD-10-CM

## 2022-03-23 DIAGNOSIS — M436 Torticollis: Secondary | ICD-10-CM

## 2022-03-23 DIAGNOSIS — R293 Abnormal posture: Secondary | ICD-10-CM

## 2022-03-23 NOTE — Therapy (Signed)
OUTPATIENT PHYSICAL THERAPY TREATMENT   Patient Name: Justin Hampton MRN: [id number] DOB:1946/09/21, 76 y.o., male Today's Date: 03/23/2022  PCP: Bartholome Bill, MD REFERRING PROVIDER: Eldridge Abrahams, NP      PT End of Session - 03/23/22 1152     Visit Number 23    Date for PT Re-Evaluation 04/08/22    Authorization Type UHC Medicare    PT Start Time 1147    PT Stop Time 1225    PT Time Calculation (min) 38 min    Activity Tolerance Patient tolerated treatment well    Behavior During Therapy WFL for tasks assessed/performed                Past Medical History:  Diagnosis Date   Adenomatous colon polyp    Arthritis    fingers   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   Cirrhosis ([place])    Diverticulosis    GERD (gastroesophageal reflux disease)    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   Hepatic encephalopathy ([place]) 04/12/2012   Hepatitis C    treated   Hypoglycemia    Hypothyroidism    Past Surgical History:  Procedure Laterality Date   APPENDECTOMY     CARPAL TUNNEL RELEASE Bilateral    COLONOSCOPY     ESOPHAGOGASTRODUODENOSCOPY     x 2   ESOPHAGOGASTRODUODENOSCOPY (EGD) WITH PROPOFOL N/A 10/29/2018   Procedure: ESOPHAGOGASTRODUODENOSCOPY (EGD) WITH PROPOFOL;  Surgeon: Irving Copas., MD;  Location: [facility];  Service: Gastroenterology;  Laterality: N/A;   EUS N/A 10/29/2018   Procedure: UPPER ENDOSCOPIC ULTRASOUND (EUS) RADIAL;  Surgeon: Irving Copas., MD;  Location: [facility];  Service: Gastroenterology;  Laterality: N/A;   Patient Active Problem List   Diagnosis Date Noted   Hypotension 04/01/2021   Lactic acidosis 04/01/2021   Loose stools 04/01/2021   AKI (acute kidney injury) ([place]) 04/01/2021   Syncope 04/01/2021   Near syncope 03/31/2021   Hypoglycemia 10/31/2018   Impaired functional mobility, balance, gait, and endurance 10/31/2018   Dilation of pancreatic duct 10/02/2018   Hepatitis B core  antibody positive 09/28/2018   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[place]) 09/28/2018   History of encephalopathy 09/28/2018   Vascular dementia without behavioral disturbance ([place])    History of hepatitis C    Hypothyroidism (acquired)    Gastroesophageal reflux disease    Aneurysm, cerebral, nonruptured    AMS (altered mental status) 08/17/2018   Gait disturbance 08/02/2018   Mild memory disturbance 08/02/2018   Hepatic encephalopathy ([place]) 07/13/2018   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[place]) 07/13/2018   CKD (chronic kidney disease), stage III ([place]) 07/13/2018   Cervical stenosis of spine 05/29/2018   Degenerative disc disease, cervical 05/29/2018   Iron deficiency anemia due to chronic blood loss 09/12/2017   Rectal bleeding 06/06/2017   Acute renal failure (ARF) ([place]) 06/06/2017   Hyponatremia 06/06/2017   Hyperkalemia 06/06/2017   Anemia 06/06/2017   GI bleeding 06/06/2017   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[place])    Other dental procedure status 05/22/2017   Family history of CHF (congestive heart failure) 2017-02-09   Family history of sudden death 02-09-17   Dyspnea Jul 28, 2016   Other disorder of circulatory system 04/12/2012   Phlebolithiasis 04/12/2012   Abnormal prostate by palpation 04/11/2012   Benign neoplasm of colon 04/11/2012   Degenerative joint disease of pelvic region 04/11/2012   Disorder of prostate 04/11/2012   Iron excess 04/11/2012   Lumbosacral spondylosis  without myelopathy 04/11/2012   Nocturia 04/11/2012   Orthostatic hypotension 04/11/2012   Osteoarthritis of both hips 04/11/2012   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   Left sided sciatica 04/11/2012   History of colonic polyps 10/03/2011    REFERRING DIAG: Cervicalgia  THERAPY DIAG:  Cervicalgia  Neck stiffness  Muscle weakness (generalized)  Abnormal posture  Difficulty in walking, not elsewhere classified  Pain in right hip  PERTINENT HISTORY: OA, Cirrhosis, GERD, Hemophilia, Hepatitis  C, Hypothyroidism  PRECAUTIONS: Fall  SUBJECTIVE: Pt denies any new falls, states that he is having someone give him quotes to get a new deck installed to prevent tripping again on his older deck.  PAIN:  Are you having pain? No NPRS scale: 1-2/10 Pain location: General body aches from fall, but mostly right hip Pain orientation:  PAIN TYPE: sore Pain description: intermittent  Aggravating factors: during movement Relieving factors: rest   OBJECTIVE:    DIAGNOSTIC FINDINGS:  No new imaging   PATIENT SURVEYS:  FOTO 46% (59% projected by visit 11)   FOTO on 01/24/2022:  49% FOTO on 03/02/2022:  60%     COGNITION: Overall cognitive status: Within functional limits for tasks assessed   POSTURE:  Forward head, kyphotic thoracic spine   CERVICAL AROM/PROM   A/PROM A/ROM (deg) 12/22/2021 A/ROM 01/24/22 A/ROM 02/09/22 A/ROM 03/02/22  Flexion 45  45 45  Extension _0 Right lateral flexion _1 Left lateral flexion _2 Right rotation 40 30 40   Left rotation 40 30 40    (Blank rows = not tested)   UE MMT: Bilateral UE grossly 4 to 4+/5 throughout R hip strength of 4/5, L hip strength of grossly 4+ to 5/5 throughout   FUNCTIONAL TEST  03/23/2022: 6 min walk test without assistive device:  365 ft with having to stop to take a seated recovery period at 4 min mark, then with 30 sec remaining, pt able to complete for a total of 403.8 ft.  03/16/2022: Timed up and go (TUG): 15.1 sec without assistive device  02/09/2022: 5 times sit to stand: 14.6 sec with UE use Timed up and go (TUG): 18.4 sec without assistive device  01/24/2022: Timed up and go (TUG): 16.1 sec without assistive device  01/18/2022: Timed up and go (TUG): 16.1 sec without assistive device  12/22/2021: 5 times sit to stand: 15.9 sec with UE use Timed up and go (TUG): 16 sec without assistive device       TODAY'S TREATMENT:    03/23/2022: Nustep L5 x 7 min (518 steps). PT present to  discuss status Cervical A/ROM extension and bilat rotation 2x10 Seated:  Shoulder flexion, shoulder scaption and chest press with 1# dumbbells 2x10 each bilat, VC to lean back into the ball more 6 min walk test without assistive device:  365 ft with having to stop to take a seated recovery period at 4 min mark, then with 30 sec remaining, pt able to complete for a total of 403.8 ft. Standing rows with green theraband 2x10  03/16/2022: Nustep L5 x 7 min (518 steps). PT present to discuss status Cervical A/ROM extension and bilat rotation 2x10 each with pink ball behind pt Seated:  Shoulder flexion and chest press with cane with 1#  2x10, VC to lean back into the ball more Sit to/from stand x10 reps with UE use required TUG 15.1 sec without assistive device Ambulation down PT hallway and back x2 laps  with seated recovery period in between trials Standing:  rows and shoulder extension with green tband.  2x10 each  03/09/2022: Nustep L5 x 6 min. PT present to discuss status Seated:  Shoulder flexion and chest press with cane with 1#  2x10, VC to lean back into the ball more Cervical A/ROM extension and bilat rotation 2x10 each with pink ball behind pt Sit to/from stand x10 reps: VC to use LE > UE Standing at barre:  heel raises, hip abduction, hip extension.  BLE 2x10 Standing:  rows and shoulder extension with red tband.  2x10 each Fwd and lateral step ups with 4" step and UE use required.  X10 bilat Arm bike reverse: L2.0 x3 min with Vc to look up at numbers every now and then  PATIENT EDUCATION:  Education details: Pt educated in posture. Person educated: Patient Education method: Explanation Education comprehension: verbalized understanding     HOME EXERCISE PROGRAM: Access Code: [email or web address] URL: [email or web address] Date: 12/29/2021 Prepared by: Shelby Dubin Serra Younan  Exercises Supine Cervical Retraction with Towel - 1-2 x daily - 7 x weekly - 2 sets - 10 reps - 2 sec  hold Seated Scapular Retraction - 2 x daily - 7 x weekly - 2 sets - 10 reps Seated Cervical Rotation AROM - 2 x daily - 7 x weekly - 2 sets - 10 reps Seated Cervical Extension AROM - 2 x daily - 7 x weekly - 2 sets - 10 reps Seated Overhead Reach Stretch - 2 x daily - 7 x weekly - 2 sets - 10 reps Shoulder External Rotation and Scapular Retraction with Resistance - 1-2 x daily - 7 x weekly - 2 sets - 10 reps Standing Shoulder Horizontal Abduction with Resistance - 1-2 x daily - 7 x weekly - 2 sets - 10 reps Standing Shoulder Single Arm PNF D2 Flexion with Resistance - 1-2 x daily - 7 x weekly - 1 sets - 10 reps Seated Thoracic Extension with Hands Behind Neck - 1 x daily - 7 x weekly - 2 sets - 10 reps Standing Shoulder Flexion with Resistance - 1 x daily - 7 x weekly - 2 sets - 10 reps Standing Single Arm Shoulder Abduction with Resistance - 1 x daily - 7 x weekly - 2 sets - 10 reps Shoulder extension with resistance - Neutral - 1 x daily - 7 x weekly - 2 sets - 10 reps Standing Shoulder Row with Anchored Resistance - 1 x daily - 7 x weekly - 2 sets - 10 reps    ASSESSMENT:   CLINICAL IMPRESSION: Mr Gahm presents to PT for skilled rehabilitation continuing to report improvements.  Pt was able to complete a 6 minute walk test today without assistive device, with seated recovery period after 4 solid minutes of ambulation.  Pt continues to progress with able to use increased weights on shoulder exercises.  Pt continues to require skilled PT to progress towards goal related activities.  OBJECTIVE IMPAIRMENTS decreased balance, decreased ROM, decreased strength, impaired UE functional use, improper body mechanics, postural dysfunction, and pain.    ACTIVITY LIMITATIONS cleaning, community activity, driving, and meal prep.    PERSONAL FACTORS Age and 3+ comorbidities: OA, GERD, Cirrhosis  are also affecting patient's functional outcome.      REHAB POTENTIAL: Good   CLINICAL DECISION  MAKING: Evolving/moderate complexity   EVALUATION COMPLEXITY: Moderate     GOALS: Goals reviewed with patient? Yes   SHORT TERM GOALS:   STG Name Target Date Goal  status  1 Pt will be independent with initial HEP. Baseline:  01/12/2022 Met  12/31/2021  2 Pt to report a 40% improvement in symptoms since starting PT. Baseline:  01/19/2022 Met 01/12/2022    LONG TERM GOALS:    LTG Name Target Date Goal status  1 Pt to be independent with advanced HEP. Baseline: 04/08/2022 ONGOING  2 Pt to increase FOTO to 59% to demonstrate his increased ability to perform activities in his home. Baseline: 46% 04/08/2022 GOAL MET  3 Pt to increase BUE strength to at least 4+/5 to allow him to lift items in home. Baseline: 04/08/2022 ONGOING  4 Pt to increase cervical A/ROM by at least 10 degrees to allow him the ability to perform overhead tasks. Baseline: 04/08/2022 ONGOING  5 Pt will increase R hip strength to at least 4+/5 to allow him to perform sit to/from transfers more easily. 04/08/2022 ONGOING  6 Pt will be able to bend down to pick up objects from the floor without increased pain or falling. 04/08/2022 ONGOING    PLAN: PT FREQUENCY: 2x/week   PT DURATION: 8 weeks   PLANNED INTERVENTIONS: Therapeutic exercises, Therapeutic activity, Neuromuscular re-education, Balance training, Gait training, Patient/Family education, Joint manipulation, Joint mobilization, Aquatic Therapy, Dry Needling, Electrical stimulation, Spinal manipulation, Spinal mobilization, Cryotherapy, Moist heat, Taping, Traction, Ultrasound, Ionotophoresis 28m/ml Dexamethasone, and Manual therapy   PLAN FOR NEXT SESSION:  balance, continue with upper quarter strength/posture, plan for reassessment of goals with PT next week    SJuel Burrow PT, DPT 03/23/22 12:30 PM    [facility] [address] [place] [id number] # [phone] [phone]

## 2022-03-28 ENCOUNTER — Encounter: Payer: Self-pay | Admitting: Physical Therapy

## 2022-03-28 ENCOUNTER — Ambulatory Visit: Payer: Medicare Other | Attending: Nurse Practitioner | Admitting: Physical Therapy

## 2022-03-28 DIAGNOSIS — M436 Torticollis: Secondary | ICD-10-CM | POA: Diagnosis present

## 2022-03-28 DIAGNOSIS — M25551 Pain in right hip: Secondary | ICD-10-CM | POA: Insufficient documentation

## 2022-03-28 DIAGNOSIS — M542 Cervicalgia: Secondary | ICD-10-CM | POA: Insufficient documentation

## 2022-03-28 DIAGNOSIS — M6281 Muscle weakness (generalized): Secondary | ICD-10-CM | POA: Insufficient documentation

## 2022-03-28 DIAGNOSIS — R262 Difficulty in walking, not elsewhere classified: Secondary | ICD-10-CM | POA: Diagnosis present

## 2022-03-28 DIAGNOSIS — R293 Abnormal posture: Secondary | ICD-10-CM | POA: Diagnosis present

## 2022-03-28 NOTE — Therapy (Signed)
OUTPATIENT PHYSICAL THERAPY TREATMENT   Patient Name: Justin Hampton MRN: [id number] DOB:1945/12/21, 76 y.o., male Today's Date: 03/28/2022  PCP: Bartholome Bill, MD REFERRING PROVIDER: Eldridge Abrahams, NP      PT End of Session - 03/28/22 1057     Visit Number 24    Date for PT Re-Evaluation 04/08/22    Authorization Type UHC Medicare    PT Start Time 1057    PT Stop Time 1135    PT Time Calculation (min) 38 min    Activity Tolerance Patient tolerated treatment well    Behavior During Therapy WFL for tasks assessed/performed                Past Medical History:  Diagnosis Date   Adenomatous colon polyp    Arthritis    fingers   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) 04/12/2012   Hepatitis C    treated   Hypoglycemia    Hypothyroidism    Past Surgical History:  Procedure Laterality Date   APPENDECTOMY     CARPAL TUNNEL RELEASE Bilateral    COLONOSCOPY     ESOPHAGOGASTRODUODENOSCOPY     x 2   ESOPHAGOGASTRODUODENOSCOPY (EGD) WITH PROPOFOL N/A 10/29/2018   Procedure: ESOPHAGOGASTRODUODENOSCOPY (EGD) WITH PROPOFOL;  Surgeon: Irving Copas., MD;  Location: [facility];  Service: Gastroenterology;  Laterality: N/A;   EUS N/A 10/29/2018   Procedure: UPPER ENDOSCOPIC ULTRASOUND (EUS) RADIAL;  Surgeon: Irving Copas., MD;  Location: [facility];  Service: Gastroenterology;  Laterality: N/A;   Patient Active Problem List   Diagnosis Date Noted   Hypotension 04/01/2021   Lactic acidosis 04/01/2021   Loose stools 04/01/2021   AKI (acute kidney injury) ([place]) 04/01/2021   Syncope 04/01/2021   Near syncope 03/31/2021   Hypoglycemia 10/31/2018   Impaired functional mobility, balance, gait, and endurance 10/31/2018   Dilation of pancreatic duct 10/02/2018   Hepatitis B core  antibody positive 09/28/2018   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[place]) 09/28/2018   History of encephalopathy 09/28/2018   Vascular dementia without behavioral disturbance ([place])    History of hepatitis C    Hypothyroidism (acquired)    Gastroesophageal reflux disease    Aneurysm, cerebral, nonruptured    AMS (altered mental status) 08/17/2018   Gait disturbance 08/02/2018   Mild memory disturbance 08/02/2018   Hepatic encephalopathy ([place]) 07/13/2018   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[place]) 07/13/2018   CKD (chronic kidney disease), stage III ([place]) 07/13/2018   Cervical stenosis of spine 05/29/2018   Degenerative disc disease, cervical 05/29/2018   Iron deficiency anemia due to chronic blood loss 09/12/2017   Rectal bleeding 06/06/2017   Acute renal failure (ARF) ([place]) 06/06/2017   Hyponatremia 06/06/2017   Hyperkalemia 06/06/2017   Anemia 06/06/2017   GI bleeding 06/06/2017   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[place])    Other dental procedure status 05/22/2017   Family history of CHF (congestive heart failure) Jan 25, 2017   Family history of sudden death 01/25/17   Dyspnea Jul 13, 2016   Other disorder of circulatory system 04/12/2012   Phlebolithiasis 04/12/2012   Abnormal prostate by palpation 04/11/2012   Benign neoplasm of colon 04/11/2012   Degenerative joint disease of pelvic region 04/11/2012   Disorder of prostate 04/11/2012   Iron excess 04/11/2012   Lumbosacral spondylosis  without myelopathy 04/11/2012   Nocturia 04/11/2012   Orthostatic hypotension 04/11/2012   Osteoarthritis of both hips 04/11/2012   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   Left sided sciatica 04/11/2012   History of colonic polyps 10/03/2011    REFERRING DIAG: Cervicalgia  THERAPY DIAG:  Cervicalgia  Neck stiffness  Muscle weakness (generalized)  Abnormal posture  Difficulty in walking, not elsewhere classified  PERTINENT HISTORY: OA, Cirrhosis, GERD, Hemophilia, Hepatitis C,  Hypothyroidism  PRECAUTIONS: Fall  SUBJECTIVE: Pt denies any new falls. PAIN:  Are you having pain? No NPRS scale:  Pain location: General body aches from fall, but mostly right hip Pain orientation:  PAIN TYPE: sore Pain description: intermittent  Aggravating factors: during movement Relieving factors: rest   OBJECTIVE:    DIAGNOSTIC FINDINGS:  No new imaging   PATIENT SURVEYS:  FOTO 46% (59% projected by visit 11)   FOTO on 01/24/2022:  49% FOTO on 03/02/2022:  60%     COGNITION: Overall cognitive status: Within functional limits for tasks assessed   POSTURE:  Forward head, kyphotic thoracic spine   CERVICAL AROM/PROM   A/PROM A/ROM (deg) 12/22/2021 A/ROM 01/24/22 A/ROM 02/09/22 A/ROM 03/02/22  Flexion 45  45 45  Extension '10 10 15 15  ' Right lateral flexion '12  20 20  ' Left lateral flexion '10  20 20  ' Right rotation 40 30 40   Left rotation 40 30 40    (Blank rows = not tested)   UE MMT: Bilateral UE grossly 4 to 4+/5 throughout R hip strength of 4/5, L hip strength of grossly 4+ to 5/5 throughout   FUNCTIONAL TEST  03/23/2022: 6 min walk test without assistive device:  365 ft with having to stop to take a seated recovery period at 4 min mark, then with 30 sec remaining, pt able to complete for a total of 403.8 ft.  03/16/2022: Timed up and go (TUG): 15.1 sec without assistive device  02/09/2022: 5 times sit to stand: 14.6 sec with UE use Timed up and go (TUG): 18.4 sec without assistive device  01/24/2022: Timed up and go (TUG): 16.1 sec without assistive device  01/18/2022: Timed up and go (TUG): 16.1 sec without assistive device  12/22/2021: 5 times sit to stand: 15.9 sec with UE use Timed up and go (TUG): 16 sec without assistive device       TODAY'S TREATMENT:   03/28/22: Nustep L5 x 8 min ( steps). PTA  present to discuss status Cervical A/ROM extension and bilat rotation 2x10 Seated:  Shoulder flexion, shoulder scaption and chest press with 1#  dumbbells 2x10 each bilat, VC to lean back into the ball more Trunk extension into ball with cervical extension 10x Standing rows with green theraband 2x10 Arm bike L1.5 3x3 with pt reading time numbers out loud to assist in more active cervical extension.   03/23/2022: Nustep L5 x 7 min (518 steps). PT present to discuss status Cervical A/ROM extension and bilat rotation 2x10 Seated:  Shoulder flexion, shoulder scaption and chest press with 1# dumbbells 2x10 each bilat, VC to lean back into the ball more 6 min walk test without assistive device:  365 ft with having to stop to take a seated recovery period at 4 min mark, then with 30 sec remaining, pt able to complete for a total of 403.8 ft. Standing rows with green theraband 2x10  03/16/2022: Nustep L5 x 7 min (518 steps). PT present to discuss status Cervical A/ROM extension and bilat rotation 2x10 each with  pink ball behind pt Seated:  Shoulder flexion and chest press with cane with 1#  2x10, VC to lean back into the ball more Sit to/from stand x10 reps with UE use required TUG 15.1 sec without assistive device Ambulation down PT hallway and back x2 laps with seated recovery period in between trials Standing:  rows and shoulder extension with green tband.  2x10 each  PATIENT EDUCATION:  Education details: Pt educated in posture. Person educated: Patient Education method: Explanation Education comprehension: verbalized understanding     HOME EXERCISE PROGRAM: Access Code: [email or web address] URL: [email or web address] Date: 12/29/2021 Prepared by: Shelby Dubin Men[place]  Exercises Supine Cervical Retraction with Towel - 1-2 x daily - 7 x weekly - 2 sets - 10 reps - 2 sec hold Seated Scapular Retraction - 2 x daily - 7 x weekly - 2 sets - 10 reps Seated Cervical Rotation AROM - 2 x daily - 7 x weekly - 2 sets - 10 reps Seated Cervical Extension AROM - 2 x daily - 7 x weekly - 2 sets - 10 reps Seated Overhead Reach Stretch - 2 x daily  - 7 x weekly - 2 sets - 10 reps Shoulder External Rotation and Scapular Retraction with Resistance - 1-2 x daily - 7 x weekly - 2 sets - 10 reps Standing Shoulder Horizontal Abduction with Resistance - 1-2 x daily - 7 x weekly - 2 sets - 10 reps Standing Shoulder Single Arm PNF D2 Flexion with Resistance - 1-2 x daily - 7 x weekly - 1 sets - 10 reps Seated Thoracic Extension with Hands Behind Neck - 1 x daily - 7 x weekly - 2 sets - 10 reps Standing Shoulder Flexion with Resistance - 1 x daily - 7 x weekly - 2 sets - 10 reps Standing Single Arm Shoulder Abduction with Resistance - 1 x daily - 7 x weekly - 2 sets - 10 reps Shoulder extension with resistance - Neutral - 1 x daily - 7 x weekly - 2 sets - 10 reps Standing Shoulder Row with Anchored Resistance - 1 x daily - 7 x weekly - 2 sets - 10 reps    ASSESSMENT:   CLINICAL IMPRESSION: Mr Vernier presents to PT for skilled rehabilitation continuing to report improvements.  Pt did not seem to fatigue as quickly in his trunk when doing arm lifts. Pt continues to require skilled PT to progress towards goal related activities.  OBJECTIVE IMPAIRMENTS decreased balance, decreased ROM, decreased strength, impaired UE functional use, improper body mechanics, postural dysfunction, and pain.    ACTIVITY LIMITATIONS cleaning, community activity, driving, and meal prep.    PERSONAL FACTORS Age and 3+ comorbidities: OA, GERD, Cirrhosis  are also affecting patient's functional outcome.      REHAB POTENTIAL: Good   CLINICAL DECISION MAKING: Evolving/moderate complexity   EVALUATION COMPLEXITY: Moderate     GOALS: Goals reviewed with patient? Yes   SHORT TERM GOALS:   STG Name Target Date Goal status  1 Pt will be independent with initial HEP. Baseline:  01/12/2022 Met  12/31/2021  2 Pt to report a 40% improvement in symptoms since starting PT. Baseline:  01/19/2022 Met 01/12/2022    LONG TERM GOALS:    LTG Name Target Date Goal status  1  Pt to be independent with advanced HEP. Baseline: 04/08/2022 ONGOING  2 Pt to increase FOTO to 59% to demonstrate his increased ability to perform activities in his home. Baseline: 46% 04/08/2022 GOAL MET  3 Pt to increase  BUE strength to at least 4+/5 to allow him to lift items in home. Baseline: 04/08/2022 ONGOING  4 Pt to increase cervical A/ROM by at least 10 degrees to allow him the ability to perform overhead tasks. Baseline: 04/08/2022 ONGOING  5 Pt will increase R hip strength to at least 4+/5 to allow him to perform sit to/from transfers more easily. 04/08/2022 ONGOING  6 Pt will be able to bend down to pick up objects from the floor without increased pain or falling. 04/08/2022 ONGOING    PLAN: PT FREQUENCY: 2x/week   PT DURATION: 8 weeks   PLANNED INTERVENTIONS: Therapeutic exercises, Therapeutic activity, Neuromuscular re-education, Balance training, Gait training, Patient/Family education, Joint manipulation, Joint mobilization, Aquatic Therapy, Dry Needling, Electrical stimulation, Spinal manipulation, Spinal mobilization, Cryotherapy, Moist heat, Taping, Traction, Ultrasound, Ionotophoresis 14m/ml Dexamethasone, and Manual therapy   PLAN FOR NEXT SESSION:  balance, continue with upper quarter strength/posture. Pt near end of this POC, has 2 more visits scheduled.    JMyrene Galas PTA 03/28/22 11:31 AM   [facility] [address] [place] [address]Phone # [phone] [phone]

## 2022-03-30 ENCOUNTER — Encounter: Payer: Self-pay | Admitting: Rehabilitative and Restorative Service Providers"

## 2022-03-30 ENCOUNTER — Ambulatory Visit: Payer: Medicare Other | Admitting: Rehabilitative and Restorative Service Providers"

## 2022-03-30 DIAGNOSIS — R293 Abnormal posture: Secondary | ICD-10-CM

## 2022-03-30 DIAGNOSIS — M542 Cervicalgia: Secondary | ICD-10-CM | POA: Diagnosis not present

## 2022-03-30 DIAGNOSIS — M436 Torticollis: Secondary | ICD-10-CM

## 2022-03-30 DIAGNOSIS — R262 Difficulty in walking, not elsewhere classified: Secondary | ICD-10-CM

## 2022-03-30 DIAGNOSIS — M6281 Muscle weakness (generalized): Secondary | ICD-10-CM

## 2022-03-30 DIAGNOSIS — M25551 Pain in right hip: Secondary | ICD-10-CM

## 2022-03-30 NOTE — Therapy (Signed)
OUTPATIENT PHYSICAL THERAPY TREATMENT AND DISCHARGE SUMMARY   Patient Name: Justin Hampton MRN: [id number] DOB:[birth year], 76 y.o., male Today's Date: 03/30/2022  PCP: Bartholome Bill, MD REFERRING PROVIDER: Eldridge Abrahams, NP      PT End of Session - 03/30/22 1110     Visit Number 25    Date for PT Re-Evaluation 04/08/22    Authorization Type [employer] Medicare    PT Start Time 1100    PT Stop Time 1140    PT Time Calculation (min) 40 min    Activity Tolerance Patient tolerated treatment well    Behavior During Therapy WFL for tasks assessed/performed                Past Medical History:  Diagnosis Date   Adenomatous colon polyp    Arthritis    fingers   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   Cirrhosis ([place])    Diverticulosis    GERD (gastroesophageal reflux disease)    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   Hepatic encephalopathy ([place]) 04/12/2012   Hepatitis C    treated   Hypoglycemia    Hypothyroidism    Past Surgical History:  Procedure Laterality Date   APPENDECTOMY     CARPAL TUNNEL RELEASE Bilateral    COLONOSCOPY     ESOPHAGOGASTRODUODENOSCOPY     x 2   ESOPHAGOGASTRODUODENOSCOPY (EGD) WITH PROPOFOL N/A 10/29/2018   Procedure: ESOPHAGOGASTRODUODENOSCOPY (EGD) WITH PROPOFOL;  Surgeon: Irving Copas., MD;  Location: [facility];  Service: Gastroenterology;  Laterality: N/A;   EUS N/A 10/29/2018   Procedure: UPPER ENDOSCOPIC ULTRASOUND (EUS) RADIAL;  Surgeon: Irving Copas., MD;  Location: [facility];  Service: Gastroenterology;  Laterality: N/A;   Patient Active Problem List   Diagnosis Date Noted   Hypotension 04/01/2021   Lactic acidosis 04/01/2021   Loose stools 04/01/2021   AKI (acute kidney injury) ([place]) 04/01/2021   Syncope 04/01/2021   Near syncope 03/31/2021   Hypoglycemia 10/31/2018   Impaired functional mobility, balance, gait, and endurance 10/31/2018   Dilation of pancreatic duct 10/02/2018    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[place]) 09/28/2018   History of encephalopathy 09/28/2018   Vascular dementia without behavioral disturbance ([place])    History of hepatitis C    Hypothyroidism (acquired)    Gastroesophageal reflux disease    Aneurysm, cerebral, nonruptured    AMS (altered mental status) 08/17/2018   Gait disturbance 08/02/2018   Mild memory disturbance 08/02/2018   Hepatic encephalopathy ([place]) 07/13/2018   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[place]) 07/13/2018   CKD (chronic kidney disease), stage III ([place]) 07/13/2018   Cervical stenosis of spine 05/29/2018   Degenerative disc disease, cervical 05/29/2018   Iron deficiency anemia due to chronic blood loss 09/12/2017   Rectal bleeding 06/06/2017   Acute renal failure (ARF) ([place]) 06/06/2017   Hyponatremia 06/06/2017   Hyperkalemia 06/06/2017   Anemia 06/06/2017   GI bleeding 06/06/2017   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[place])    Other dental procedure status 05/22/2017   Family history of CHF (congestive heart failure) 2017/02/15   Family history of sudden death Feb 15, 2017   Dyspnea 03-Aug-2016   Other disorder of circulatory system 04/12/2012   Phlebolithiasis 04/12/2012   Abnormal prostate by palpation 04/11/2012   Benign neoplasm of colon 04/11/2012   Degenerative joint disease of pelvic region 04/11/2012   Disorder of prostate 04/11/2012   Iron excess 04/11/2012  Lumbosacral spondylosis without myelopathy 04/11/2012   Nocturia 04/11/2012   Orthostatic hypotension 04/11/2012   Osteoarthritis of both hips 04/11/2012   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   Left sided sciatica 04/11/2012   History of colonic polyps 10/03/2011    REFERRING DIAG: Cervicalgia  THERAPY DIAG:  Cervicalgia  Neck stiffness  Muscle weakness (generalized)  Abnormal posture  Difficulty in walking, not elsewhere classified  Pain in right hip  PERTINENT HISTORY: OA, Cirrhosis, GERD,  Hemophilia, Hepatitis C, Hypothyroidism  PRECAUTIONS: Fall  SUBJECTIVE: Pt denies any new falls. Pt reports that he has been able to have increased visual sight line when driving and looking each direction when leaving his driveway.  PAIN:  Are you having pain? No NPRS scale:  Pain location: General body aches from fall, but mostly right hip Pain orientation:  PAIN TYPE: sore Pain description: intermittent  Aggravating factors: during movement Relieving factors: rest   OBJECTIVE:    DIAGNOSTIC FINDINGS:  No new imaging   PATIENT SURVEYS:   FOTO 46% (59% projected by visit 11)   FOTO on 01/24/2022:  49% FOTO on 03/02/2022:  60% FOTO on 03/30/2022:  69%     COGNITION: Overall cognitive status: Within functional limits for tasks assessed   POSTURE:  Forward head, kyphotic thoracic spine   CERVICAL AROM/PROM   A/PROM A/ROM (deg) 12/22/2021 A/ROM 01/24/22 A/ROM 02/09/22 A/ROM 03/02/22 A/ROM 03/30/2022  Flexion 45  45 45 58  Extension _0 Right lateral flexion _1 Left lateral flexion _2 Right rotation 40 30 40  48  Left rotation 40 30 40  50   (Blank rows = not tested)   UE MMT: 03/31/19 Bilateral UE grossly 5/5 throughout R hip strength of 4+/5, L hip strength of grossly 4+ to 5/5 throughout   FUNCTIONAL TEST  03/23/2022: 6 min walk test without assistive device:  365 ft with having to stop to take a seated recovery period at 4 min mark, then with 30 sec remaining, pt able to complete for a total of 403.8 ft.  03/16/2022: Timed up and go (TUG): 15.1 sec without assistive device  02/09/2022: 5 times sit to stand: 14.6 sec with UE use Timed up and go (TUG): 18.4 sec without assistive device  01/24/2022: Timed up and go (TUG): 16.1 sec without assistive device  01/18/2022: Timed up and go (TUG): 16.1 sec without assistive device  12/22/2021: 5 times sit to stand: 15.9 sec with UE use Timed up and go (TUG): 16 sec without assistive  device       TODAY'S TREATMENT:    03/30/2022: Nustep L5 x 10 min (717 steps). PT  present to discuss status Cervical ROM measurements (above) Trunk extension into ball with cervical extension 2x10 Seated:  Shoulder flexion, shoulder scaption and chest press with 1# dumbbells 2x10 each bilat, VC to lean back into the ball more  03/28/22: Nustep L5 x 8 min ( steps). PTA  present to discuss status Cervical A/ROM extension and bilat rotation 2x10 Seated:  Shoulder flexion, shoulder scaption and chest press with 1# dumbbells 2x10 each bilat, VC to lean back into the ball more Trunk extension into ball with cervical extension 10x Standing rows with green theraband 2x10 Arm bike L1.5 3x3 with pt reading time numbers out loud to assist in more active cervical extension.   03/23/2022: Nustep L5 x 7 min (518 steps). PT present to discuss status Cervical A/ROM extension  and bilat rotation 2x10 Seated:  Shoulder flexion, shoulder scaption and chest press with 1# dumbbells 2x10 each bilat, VC to lean back into the ball more 6 min walk test without assistive device:  365 ft with having to stop to take a seated recovery period at 4 min mark, then with 30 sec remaining, pt able to complete for a total of 403.8 ft. Standing rows with green theraband 2x10  03/16/2022: Nustep L5 x 7 min (518 steps). PT present to discuss status Cervical A/ROM extension and bilat rotation 2x10 each with pink ball behind pt Seated:  Shoulder flexion and chest press with cane with 1#  2x10, VC to lean back into the ball more Sit to/from stand x10 reps with UE use required TUG 15.1 sec without assistive device Ambulation down PT hallway and back x2 laps with seated recovery period in between trials Standing:  rows and shoulder extension with green tband.  2x10 each  PATIENT EDUCATION:  Education details: Pt educated in posture. Person educated: Patient Education method: Explanation Education comprehension: verbalized  understanding     HOME EXERCISE PROGRAM: Access Code: [email or web address] URL: [email or web address] Date: 12/29/2021 Prepared by: Shelby Dubin Mindie Rawdon  Exercises Supine Cervical Retraction with Towel - 1-2 x daily - 7 x weekly - 2 sets - 10 reps - 2 sec hold Seated Scapular Retraction - 2 x daily - 7 x weekly - 2 sets - 10 reps Seated Cervical Rotation AROM - 2 x daily - 7 x weekly - 2 sets - 10 reps Seated Cervical Extension AROM - 2 x daily - 7 x weekly - 2 sets - 10 reps Seated Overhead Reach Stretch - 2 x daily - 7 x weekly - 2 sets - 10 reps Shoulder External Rotation and Scapular Retraction with Resistance - 1-2 x daily - 7 x weekly - 2 sets - 10 reps Standing Shoulder Horizontal Abduction with Resistance - 1-2 x daily - 7 x weekly - 2 sets - 10 reps Standing Shoulder Single Arm PNF D2 Flexion with Resistance - 1-2 x daily - 7 x weekly - 1 sets - 10 reps Seated Thoracic Extension with Hands Behind Neck - 1 x daily - 7 x weekly - 2 sets - 10 reps Standing Shoulder Flexion with Resistance - 1 x daily - 7 x weekly - 2 sets - 10 reps Standing Single Arm Shoulder Abduction with Resistance - 1 x daily - 7 x weekly - 2 sets - 10 reps Shoulder extension with resistance - Neutral - 1 x daily - 7 x weekly - 2 sets - 10 reps Standing Shoulder Row with Anchored Resistance - 1 x daily - 7 x weekly - 2 sets - 10 reps    ASSESSMENT:   CLINICAL IMPRESSION: Mr Stucki presents to PT for skilled rehabilitation continuing to report improvements.  Pt has made great progress in goal related activities and has met all goals at this time.  Pt continues to have cervical forward head, but he has improved on his available range and is more easily able to look up to manipulate microwave or thermostat.  In driving, pt is able to look behind him and to either side of the road better, especially when backing out of his driveway.  Pt is independent and compliant with HEP and has not reported any new falls and has  improved his strength.  Pt was able to reach down during PT session to pick up objects from floor without loosing balance.  Pt is ready to  be discharged from outpatient PT at this time to continue with HEP.  OBJECTIVE IMPAIRMENTS decreased balance, decreased ROM, decreased strength, impaired UE functional use, improper body mechanics, postural dysfunction, and pain.    ACTIVITY LIMITATIONS cleaning, community activity, driving, and meal prep.    PERSONAL FACTORS Age and 3+ comorbidities: OA, GERD, Cirrhosis  are also affecting patient's functional outcome.      REHAB POTENTIAL: Good   CLINICAL DECISION MAKING: Evolving/moderate complexity   EVALUATION COMPLEXITY: Moderate     GOALS: Goals reviewed with patient? Yes   SHORT TERM GOALS:   STG Name Target Date Goal status  1 Pt will be independent with initial HEP. Baseline:  01/12/2022 Met  12/31/2021  2 Pt to report a 40% improvement in symptoms since starting PT. Baseline:  01/19/2022 Met 01/12/2022    LONG TERM GOALS:    LTG Name Target Date Goal status  1 Pt to be independent with advanced HEP. Baseline: 04/08/2022 ONGOING  2 Pt to increase FOTO to 59% to demonstrate his increased ability to perform activities in his home. Baseline: 46% 04/08/2022 GOAL MET  3 Pt to increase BUE strength to at least 4+/5 to allow him to lift items in home. Baseline: 04/08/2022 Goal Met  4 Pt to increase cervical A/ROM by at least 10 degrees to allow him the ability to perform overhead tasks. Baseline: 04/08/2022 Goal Met  5 Pt will increase R hip strength to at least 4+/5 to allow him to perform sit to/from transfers more easily. 04/08/2022 Goal Met  6 Pt will be able to bend down to pick up objects from the floor without increased pain or falling. 04/08/2022 Goal Met    PLAN: PT FREQUENCY: 2x/week   PT DURATION: 8 weeks   PLANNED INTERVENTIONS: Therapeutic exercises, Therapeutic activity, Neuromuscular re-education, Balance training, Gait  training, Patient/Family education, Joint manipulation, Joint mobilization, Aquatic Therapy, Dry Needling, Electrical stimulation, Spinal manipulation, Spinal mobilization, Cryotherapy, Moist heat, Taping, Traction, Ultrasound, Ionotophoresis 74m/ml Dexamethasone, and Manual therapy   PLAN FOR NEXT SESSION:  balance, continue with upper quarter strength/posture. Pt near end of this POC, has 2 more visits scheduled.   PHYSICAL THERAPY DISCHARGE SUMMARY   Patient agrees to discharge. Patient goals were met. Patient is being discharged due to meeting the stated rehab goals.    SJuel Burrow PT 03/30/22 12:09 PM   BBapt[facility] [address] [place] [address]Phone # [phone] [phone]

## 2022-04-04 ENCOUNTER — Encounter: Payer: Medicare Other | Admitting: Physical Therapy

## 2023-01-24 ENCOUNTER — Other Ambulatory Visit: Payer: Self-pay

## 2023-01-24 ENCOUNTER — Encounter: Payer: Self-pay | Admitting: Physical Therapy

## 2023-01-24 ENCOUNTER — Ambulatory Visit: Payer: Medicare Other | Attending: Hematology & Oncology | Admitting: Physical Therapy

## 2023-01-24 DIAGNOSIS — K7682 Hepatic encephalopathy: Secondary | ICD-10-CM | POA: Diagnosis not present

## 2023-01-24 DIAGNOSIS — M25512 Pain in left shoulder: Secondary | ICD-10-CM | POA: Insufficient documentation

## 2023-01-24 DIAGNOSIS — M25511 Pain in right shoulder: Secondary | ICD-10-CM | POA: Insufficient documentation

## 2023-01-24 DIAGNOSIS — R262 Difficulty in walking, not elsewhere classified: Secondary | ICD-10-CM

## 2023-01-24 DIAGNOSIS — M6281 Muscle weakness (generalized): Secondary | ICD-10-CM

## 2023-01-24 DIAGNOSIS — G8929 Other chronic pain: Secondary | ICD-10-CM | POA: Insufficient documentation

## 2023-01-24 DIAGNOSIS — M542 Cervicalgia: Secondary | ICD-10-CM

## 2023-01-24 DIAGNOSIS — R293 Abnormal posture: Secondary | ICD-10-CM

## 2023-01-24 DIAGNOSIS — R4182 Altered mental status, unspecified: Secondary | ICD-10-CM | POA: Diagnosis not present

## 2023-01-24 NOTE — Therapy (Addendum)
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   Cirrhosis    Diverticulosis    GERD (gastroesophageal reflux disease)    Hemophilia    Hemophilia B   Hepatic encephalopathy 04/12/2012   Hepatitis C    treated   Hypoglycemia    Hypothyroidism    Past Surgical History:  Procedure Laterality Date   APPENDECTOMY     CARPAL TUNNEL RELEASE Bilateral    COLONOSCOPY     ESOPHAGOGASTRODUODENOSCOPY     x 2   ESOPHAGOGASTRODUODENOSCOPY (EGD) WITH PROPOFOL N/A 10/29/2018   Procedure: ESOPHAGOGASTRODUODENOSCOPY (EGD) WITH PROPOFOL;  Surgeon: Lemar Lofty., MD;  Location: [facility] ENDOSCOPY;  Service: Gastroenterology;  Laterality: N/A;   EUS N/A 10/29/2018   Procedure: UPPER ENDOSCOPIC ULTRASOUND (EUS) RADIAL;  Surgeon: Lemar Lofty., MD;  Location: [facility] ENDOSCOPY;  Service: Gastroenterology;  Laterality: N/A;   Patient Active Problem List   Diagnosis Date Noted   Hypotension 04/01/2021   Lactic acidosis 04/01/2021   Loose stools 04/01/2021   AKI (acute kidney injury) 04/01/2021   Syncope 04/01/2021   Near syncope 03/31/2021   Hypoglycemia 10/31/2018   Impaired functional mobility, balance, gait, and endurance 10/31/2018   Dilation of pancreatic duct 10/02/2018   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   Chronic hepatitis C with cirrhosis 09/28/2018   History of  encephalopathy 09/28/2018   Vascular dementia without behavioral disturbance    History of hepatitis C    Hypothyroidism (acquired)    Gastroesophageal reflux disease    Aneurysm, cerebral, nonruptured    AMS (altered mental status) 08/17/2018   Gait disturbance 08/02/2018   Mild memory disturbance 08/02/2018   Hepatic encephalopathy 07/13/2018   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) 07/13/2018   CKD (chronic kidney disease), stage III 07/13/2018   Cervical stenosis of spine 05/29/2018   Degenerative disc disease, cervical 05/29/2018   Iron deficiency anemia due to chronic blood loss 09/12/2017   Rectal bleeding 06/06/2017   Acute renal failure (ARF) 06/06/2017   Hyponatremia 06/06/2017   Hyperkalemia 06/06/2017   Anemia 06/06/2017   GI bleeding 06/06/2017   Hemophilia B    Other dental procedure status 05/22/2017   Family history of CHF (congestive heart failure) 02-16-17   Family history of sudden death 02/16/2017   Dyspnea 08-04-2016   Other disorder of circulatory system 04/12/2012   Phlebolithiasis 04/12/2012   Abnormal prostate by palpation 04/11/2012   Benign neoplasm of colon 04/11/2012   Degenerative joint disease of pelvic region 04/11/2012   Disorder of prostate 04/11/2012   Iron excess 04/11/2012   Lumbosacral spondylosis without myelopathy 04/11/2012   Nocturia 04/11/2012   Orthostatic hypotension 04/11/2012   Osteoarthritis of both hips 04/11/2012   Osteoarthritis of lumbar  spine 04/11/2012   Left sided sciatica 04/11/2012   History of colonic polyps 10/03/2011    PCP: Verlon Au, MD  REFERRING PROVIDER: Donia Guiles, MD  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)  THERAPY DIAG:  Cervicalgia - Plan: PT plan of care cert/re-cert  Abnormal posture - Plan: PT plan of care cert/re-cert  Muscle weakness (generalized) - Plan: PT plan of care cert/re-cert  Chronic left shoulder pain - Plan: PT plan of care  cert/re-cert  Chronic right shoulder pain - Plan: PT plan of care cert/re-cert  Difficulty in walking, not elsewhere classified - Plan: PT plan of care cert/re-cert  Rationale for Evaluation and Treatment: Rehabilitation  ONSET DATE: chronic  SUBJECTIVE:                                                                                                                                                                                                         SUBJECTIVE STATEMENT: Pt with ongoing chronic neck and bil shoulder pain.  I also wobble when I walk.    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 Yes NPRS scale: 3-4/10 Pain location: bil shoulders and neck Pain orientation: Right, Left, and Medial  PAIN TYPE: aching, dull, and tight Pain description: intermittent  Aggravating factors: trying to pick my head up, looking at the birds, loading the bird feeder, turning head Relieving factors: laying down, hot shower, sitting in recliner to watch TV   PRECAUTIONS: Other: no Graston, no dry needling/acupuncture, no aggressive manipulations  WEIGHT BEARING RESTRICTIONS: No  FALLS:  Has patient fallen in last 6 months? Yes. Number of falls 2-3  LIVING ENVIRONMENT: Lives with: lives alone Lives in: House/apartment Stairs: Yes: Internal: 16 steps; can reach both and External: 4 steps; on left going up and can reach both Has following equipment at home: Single point cane full time  OCCUPATION: retired  PLOF: Independent  PATIENT GOALS: improve balance and not wobble as much, reduce neck and shoulder pain  NEXT MD VISIT: every 6 mos (hematologist)  OBJECTIVE:   DIAGNOSTIC FINDINGS:  2019 cervical MRI following a fall: Disc levels: Degenerative changes throughout the cervical spine, at least moderate in degree throughout, with associated  disc space narrowings and osseous spurring. Associated central canal stenoses at multiple levels, moderate in degree at the C2-3 through C4-5 levels, moderate to severe in degree at the C5-6 level with probable mass effect on the cervical cord. Suspect associated nerve  root impingement at multiple levels bilaterally.   Upper chest: No acute findings.   Other: None.   IMPRESSION: 1. No acute intracranial abnormality. No intracranial hemorrhage or edema. No skull fracture. Chronic small vessel ischemic changes within the white matter. 2. Displaced/depressed fractures at the anterior aspects of the nasal bones. No other facial bone fracture or dislocation seen. 3. No fracture or acute subluxation within the cervical spine. Degenerative changes of the cervical spine, as detailed above.    PATIENT SURVEYS:  FOTO 53% goal 59%  COGNITION: Overall cognitive status: Within functional limits for tasks assessed  SENSATION: WFL  POSTURE: rounded shoulders, forward head, increased thoracic kyphosis, and flexed trunk   PALPATION: Tight upper quadrant soft tissues Signif hypomobility of bil U joints and facets throughout c-spine and upper t-spine, rigid forward head posture   CERVICAL ROM:   Active ROM A/PROM (deg) eval  Flexion 30  Extension 25 (from signif forward head)  Right lateral flexion 5  Left lateral flexion 15  Right rotation 50  Left rotation 30   (Blank rows = not tested)  UPPER EXTREMITY ROM:  Active ROM Right eval Left eval  Shoulder flexion 165 170  Shoulder extension    Shoulder abduction    Shoulder adduction    Shoulder extension    Shoulder internal rotation    Shoulder external rotation    Elbow flexion    Elbow extension    Wrist flexion    Wrist extension    Wrist ulnar deviation    Wrist radial deviation    Wrist pronation    Wrist supination     (Blank rows = not tested)  UPPER EXTREMITY MMT: Bil shoulders 4/5 bil Scapular stabilizers  4-/5  LOWER EXTREMITY MMT: 4+/5 bil  TRUNK STRENGTH: Abdominals 3+/5    FUNCTIONAL TESTS:  5 times sit to stand: 22 sec signif use of UE on chair Timed up and go (TUG): 33 sec with SPC, supervision by PT Lacks eccentric control with stand to sit  TODAY'S TREATMENT:                                                                                                                              DATE:  01/24/23 Supine with pillows propped under head STM bil scalenes, SCM, upper traps Gentle U-joint mobs throughout c-spine Gr I/III Intiated HEP from past HEP code - begin with first three exercises from HEP    PATIENT EDUCATION:  Education details: [id number] Person educated: Patient Education method: Explanation, Demonstration, Verbal cues, and Handouts Education comprehension: verbalized understanding and returned demonstration  HOME EXERCISE PROGRAM: Access Code: [email or web address] URL: [email or web address] Date: 01/24/2023 Prepared by: Loistine Simas Doron Shake  Exercises - Supine Cervical Retraction with Towel  - 1-2 x daily - 7 x weekly - 2 sets - 10 reps - 2 sec hold - Supine Cervical Rotation AROM on Pillow  - 1 x daily - 7 x weekly - 2 sets - 10 reps -  2 sec hold - Seated Scapular Retraction  - 2 x daily - 7 x weekly - 2 sets - 10 reps   ASSESSMENT:  CLINICAL IMPRESSION: Patient is a 77 y.o. 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  Pt also has fall risk and has fallen several times in past 6 mos.  Pt is familiar to this clinic for several prior episodes of care.  PT referral outlines contraindicaitons for dry needling, manipulations and Graston techniques secondary to genetic bleeding disorder.  Pt has signif forward head and rounded shoulder posture with flexed trunk.  He has signif limitations in all planes of cervical motion with pain.  He ambulates with small stride length bil using SPC.  He is  unable to achieve neutral head position from rigid forward head posture.  He has shoulder ROM WFL but strength of bil UE is 4/5 throughout.  Pt has increased fall risk as measured by history of falls and 5x sit to stand and TUG tests today.  Pt will benefit from skilled PT to address pain, posture, strength deficits, functional mobility, gait and balance to improve function and safety with daily tasks.  OBJECTIVE IMPAIRMENTS: Abnormal gait, decreased activity tolerance, decreased balance, decreased coordination, decreased mobility, difficulty walking, decreased ROM, decreased strength, hypomobility, increased muscle spasms, impaired flexibility, impaired tone, impaired UE functional use, improper body mechanics, postural dysfunction, and pain.   ACTIVITY LIMITATIONS: carrying, lifting, bending, standing, squatting, transfers, reach over head, and locomotion level  PARTICIPATION LIMITATIONS: meal prep, cleaning, laundry, shopping, community activity, and yard work  PERSONAL FACTORS: Age and Time since onset of injury/illness/exacerbation are also affecting patient's functional outcome.   REHAB POTENTIAL: Good  CLINICAL DECISION MAKING: Stable/uncomplicated  EVALUATION COMPLEXITY: Low   GOALS: Goals reviewed with patient? Yes  SHORT TERM GOALS: Target date: 02/21/23  Pt will be ind with initial HEP Baseline:  Goal status: INITIAL  2.  Pt will improve LE use and body mechanics with sit to stand for improved ind with transfers Baseline:  Goal status: INITIAL  3.  Pt will report at least 25% reduction in neck and shoulder pain  Baseline:  Goal status: INITIAL    LONG TERM GOALS: Target date: 03/21/23  Pt will be ind and safe with advanced HEP Baseline:  Goal status: INITIAL  2.  Pt will improve FOTO score to at least 59% to demo improved function Baseline: 53% Goal status: INITIAL  3.  Pt will demo improved cervical A/ROM by at least 10 deg in all directions for improved  ability to look up and turn head for driving and bird watching. Baseline:  Goal status: INITIAL  4.  Pt will improve 5x sit to stand by at least 5 sec and TUG test by at least 8 sec to demo reduced fall risk.   Baseline: 22 sec 5x STS, 33 sec TUG Goal status: INITIAL  5.  Pt will improve bil UE strength to allow him to lift items overhead. Baseline: 4/5 bil Goal status: INITIAL  6.  Pt will report improved neck and bil shoulder pain by at least 50% Baseline:  Goal status: INITIAL   PLAN:  PT FREQUENCY: 2x/week  PT DURATION: 8 weeks  PLANNED INTERVENTIONS: Therapeutic exercises, Therapeutic activity, Neuromuscular re-education, Balance training, Gait training, Patient/Family education, Self Care, Joint mobilization, Spinal mobilization, Cryotherapy, Moist heat, and Manual therapy  PLAN FOR NEXT SESSION:  PT reissued first 3 exercises from prior HEP  code above, review and progress as tol as Pt has gotten away from a previously successful HEP, gentle joint mobs and STM to neck and upper quadrants, gait/balance/transfer training to reduce fall risk NOTE: PT referral outlines contraindicaitons for dry needling, manipulations and Graston techniques secondary to genetic bleeding disorder.      Telena Peyser, PT 01/24/23 1:46 PM   PHYSICAL THERAPY DISCHARGE SUMMARY  Visits from Start of Care: 1  Current functional level related to goals / functional outcomes: Pt had medical event before returning for treatment sessions and is now receiving home health.   Remaining deficits: See above   Education / Equipment: See above   Patient agrees to discharge. Patient goals were not met. Patient is being discharged due to a change in medical status.  Ogechi Kuehnel, PT 03/29/23 7:53 AM

## 2023-01-25 ENCOUNTER — Emergency Department (HOSPITAL_COMMUNITY): Payer: Medicare Other

## 2023-01-25 ENCOUNTER — Encounter (HOSPITAL_COMMUNITY): Payer: Self-pay

## 2023-01-25 ENCOUNTER — Inpatient Hospital Stay (HOSPITAL_COMMUNITY)
Admission: EM | Admit: 2023-01-25 | Discharge: 2023-01-31 | DRG: 441 | Disposition: A | Discharge status: Skilled Nursing Facility | Payer: Medicare Other | Attending: Internal Medicine | Admitting: Internal Medicine

## 2023-01-25 ENCOUNTER — Other Ambulatory Visit: Payer: Self-pay

## 2023-01-25 DIAGNOSIS — Z888 Allergy status to other drugs, medicaments and biological substances status: Secondary | ICD-10-CM

## 2023-01-25 DIAGNOSIS — Z1152 Encounter for screening for COVID-19: Secondary | ICD-10-CM

## 2023-01-25 DIAGNOSIS — Z823 Family history of stroke: Secondary | ICD-10-CM

## 2023-01-25 DIAGNOSIS — Z9181 History of falling: Secondary | ICD-10-CM

## 2023-01-25 DIAGNOSIS — Z8249 Family history of ischemic heart disease and other diseases of the circulatory system: Secondary | ICD-10-CM

## 2023-01-25 DIAGNOSIS — K7460 Unspecified cirrhosis of liver: Secondary | ICD-10-CM | POA: Diagnosis present

## 2023-01-25 DIAGNOSIS — N1831 Chronic kidney disease, stage 3a: Secondary | ICD-10-CM | POA: Diagnosis present

## 2023-01-25 DIAGNOSIS — D696 Thrombocytopenia, unspecified: Secondary | ICD-10-CM | POA: Diagnosis present

## 2023-01-25 DIAGNOSIS — Z833 Family history of diabetes mellitus: Secondary | ICD-10-CM

## 2023-01-25 DIAGNOSIS — G9340 Encephalopathy, unspecified: Secondary | ICD-10-CM | POA: Diagnosis present

## 2023-01-25 DIAGNOSIS — D67 Hereditary factor IX deficiency: Secondary | ICD-10-CM | POA: Diagnosis present

## 2023-01-25 DIAGNOSIS — Z79890 Hormone replacement therapy (postmenopausal): Secondary | ICD-10-CM

## 2023-01-25 DIAGNOSIS — M19042 Primary osteoarthritis, left hand: Secondary | ICD-10-CM | POA: Diagnosis present

## 2023-01-25 DIAGNOSIS — K7682 Hepatic encephalopathy: Principal | ICD-10-CM | POA: Diagnosis present

## 2023-01-25 DIAGNOSIS — E874 Mixed disorder of acid-base balance: Secondary | ICD-10-CM | POA: Diagnosis present

## 2023-01-25 DIAGNOSIS — D539 Nutritional anemia, unspecified: Secondary | ICD-10-CM | POA: Diagnosis present

## 2023-01-25 DIAGNOSIS — K766 Portal hypertension: Secondary | ICD-10-CM

## 2023-01-25 DIAGNOSIS — E039 Hypothyroidism, unspecified: Secondary | ICD-10-CM | POA: Diagnosis present

## 2023-01-25 DIAGNOSIS — I129 Hypertensive chronic kidney disease with stage 1 through stage 4 chronic kidney disease, or unspecified chronic kidney disease: Secondary | ICD-10-CM | POA: Diagnosis present

## 2023-01-25 DIAGNOSIS — Z803 Family history of malignant neoplasm of breast: Secondary | ICD-10-CM

## 2023-01-25 DIAGNOSIS — Z832 Family history of diseases of the blood and blood-forming organs and certain disorders involving the immune mechanism: Secondary | ICD-10-CM

## 2023-01-25 DIAGNOSIS — B182 Chronic viral hepatitis C: Secondary | ICD-10-CM | POA: Diagnosis present

## 2023-01-25 DIAGNOSIS — M19041 Primary osteoarthritis, right hand: Secondary | ICD-10-CM | POA: Diagnosis present

## 2023-01-25 DIAGNOSIS — Z885 Allergy status to narcotic agent status: Secondary | ICD-10-CM

## 2023-01-25 DIAGNOSIS — R296 Repeated falls: Secondary | ICD-10-CM | POA: Diagnosis present

## 2023-01-25 DIAGNOSIS — R351 Nocturia: Secondary | ICD-10-CM | POA: Diagnosis present

## 2023-01-25 DIAGNOSIS — G4733 Obstructive sleep apnea (adult) (pediatric): Secondary | ICD-10-CM | POA: Diagnosis present

## 2023-01-25 DIAGNOSIS — Z87891 Personal history of nicotine dependence: Secondary | ICD-10-CM

## 2023-01-25 DIAGNOSIS — K219 Gastro-esophageal reflux disease without esophagitis: Secondary | ICD-10-CM | POA: Diagnosis present

## 2023-01-25 DIAGNOSIS — Z8261 Family history of arthritis: Secondary | ICD-10-CM

## 2023-01-25 DIAGNOSIS — Z79899 Other long term (current) drug therapy: Secondary | ICD-10-CM

## 2023-01-25 DIAGNOSIS — E860 Dehydration: Secondary | ICD-10-CM | POA: Diagnosis present

## 2023-01-25 DIAGNOSIS — M5030 Other cervical disc degeneration, unspecified cervical region: Secondary | ICD-10-CM | POA: Diagnosis present

## 2023-01-25 DIAGNOSIS — M362 Hemophilic arthropathy: Secondary | ICD-10-CM | POA: Diagnosis present

## 2023-01-25 DIAGNOSIS — K625 Hemorrhage of anus and rectum: Secondary | ICD-10-CM | POA: Diagnosis present

## 2023-01-25 DIAGNOSIS — N183 Chronic kidney disease, stage 3 unspecified: Secondary | ICD-10-CM | POA: Diagnosis present

## 2023-01-25 DIAGNOSIS — F0150 Vascular dementia without behavioral disturbance: Secondary | ICD-10-CM | POA: Diagnosis present

## 2023-01-25 LAB — COMPREHENSIVE METABOLIC PANEL
ALT: 24 U/L (ref 0–44)
AST: 38 U/L (ref 15–41)
Albumin: 3.4 g/dL — ABNORMAL LOW (ref 3.5–5.0)
Alkaline Phosphatase: 69 U/L (ref 38–126)
Anion gap: 9 (ref 5–15)
BUN: 37 mg/dL — ABNORMAL HIGH (ref 8–23)
CO2: 19 mmol/L — ABNORMAL LOW (ref 22–32)
Calcium: 9.4 mg/dL (ref 8.9–10.3)
Chloride: 111 mmol/L (ref 98–111)
Creatinine, Ser: 1.36 mg/dL — ABNORMAL HIGH (ref 0.61–1.24)
GFR, Estimated: 54 mL/min — ABNORMAL LOW (ref >60)
Glucose, Bld: 92 mg/dL (ref 70–99)
Potassium: 3.9 mmol/L (ref 3.5–5.1)
Sodium: 139 mmol/L (ref 135–145)
Total Bilirubin: 1.9 mg/dL — ABNORMAL HIGH (ref 0.3–1.2)
Total Protein: 7 g/dL (ref 6.5–8.1)

## 2023-01-25 LAB — CBC
HCT: 40.9 % (ref 39.0–52.0)
Hemoglobin: 13.5 g/dL (ref 13.0–17.0)
MCH: 35.1 pg — ABNORMAL HIGH (ref 26.0–34.0)
MCHC: 33 g/dL (ref 30.0–36.0)
MCV: 106.2 fL — ABNORMAL HIGH (ref 80.0–100.0)
Platelets: 145 10*3/uL — ABNORMAL LOW (ref 150–400)
RBC: 3.85 MIL/uL — ABNORMAL LOW (ref 4.22–5.81)
RDW: 12.6 % (ref 11.5–15.5)
WBC: 7.3 10*3/uL (ref 4.0–10.5)
nRBC: 0 % (ref 0.0–0.2)

## 2023-01-25 LAB — AMMONIA: Ammonia: 48 umol/L — ABNORMAL HIGH (ref 9–35)

## 2023-01-25 LAB — CBG MONITORING, ED: Glucose-Capillary: 84 mg/dL (ref 70–99)

## 2023-01-25 NOTE — ED Provider Triage Note (Signed)
Emergency Medicine Provider Triage Evaluation Note  Justin Hampton , a 77 y.o. male  was evaluated in triage.  Pt here with daughter who provides some history.  Per patient daughter, patient has been forgetful today.  The patient daughter reports that patient seems to have a hard time remembering certain things like her name.  Patient daughter reports this is occurred in the past when the patient's ammonia level is elevated.  The patient reports he has missed his ammonia medication yesterday.  Patient reports he currently lives at home alone.  Patient alert and oriented x 3.  Patient does have hard time remembering certain aspects of his personal life.  Patient daughter at bedside reports that this is consistent with past instances of high ammonia levels.  Patient denies any dysuria, chest pain, shortness of breath, abdominal pain, nausea or vomiting.  Patient neurological examination without focal neurodeficits.  Review of Systems  Positive:  Negative:   Physical Exam  BP 109/69 (BP Location: Right Arm)   Pulse 88   Temp 98.2 F (36.8 C) (Oral)   Resp 18   Ht 5\' 10"  (1.778 m)   Wt 93 kg   SpO2 100%   BMI 29.41 kg/m  Gen:   Awake, no distress   Resp:  Normal effort  MSK:   Moves extremities without difficulty  Other:    Medical Decision Making  Medically screening exam initiated at 11:34 PM.  Appropriate orders placed.  Jessus Twohig was informed that the remainder of the evaluation will be completed by another provider, this initial triage assessment does not replace that evaluation, and the importance of remaining in the ED until their evaluation is complete.     Azucena Cecil, PA-C 01/25/23 2335

## 2023-01-25 NOTE — ED Triage Notes (Signed)
Pt is oriented to person and place, but feels confused for the last couple of days. 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

## 2023-01-25 NOTE — ED Notes (Signed)
[phone] pt daughter phone number, she will also be coming up to visit her from [place]

## 2023-01-26 ENCOUNTER — Emergency Department (HOSPITAL_COMMUNITY): Payer: Medicare Other

## 2023-01-26 DIAGNOSIS — B1920 Unspecified viral hepatitis C without hepatic coma: Secondary | ICD-10-CM | POA: Diagnosis not present

## 2023-01-26 DIAGNOSIS — Z8249 Family history of ischemic heart disease and other diseases of the circulatory system: Secondary | ICD-10-CM | POA: Diagnosis not present

## 2023-01-26 DIAGNOSIS — R4182 Altered mental status, unspecified: Secondary | ICD-10-CM | POA: Diagnosis present

## 2023-01-26 DIAGNOSIS — Z1152 Encounter for screening for COVID-19: Secondary | ICD-10-CM | POA: Diagnosis not present

## 2023-01-26 DIAGNOSIS — Z79899 Other long term (current) drug therapy: Secondary | ICD-10-CM | POA: Diagnosis not present

## 2023-01-26 DIAGNOSIS — N1831 Chronic kidney disease, stage 3a: Secondary | ICD-10-CM | POA: Diagnosis present

## 2023-01-26 DIAGNOSIS — R296 Repeated falls: Secondary | ICD-10-CM | POA: Diagnosis present

## 2023-01-26 DIAGNOSIS — G9340 Encephalopathy, unspecified: Secondary | ICD-10-CM

## 2023-01-26 DIAGNOSIS — B182 Chronic viral hepatitis C: Secondary | ICD-10-CM | POA: Diagnosis present

## 2023-01-26 DIAGNOSIS — D67 Hereditary factor IX deficiency: Secondary | ICD-10-CM | POA: Diagnosis present

## 2023-01-26 DIAGNOSIS — K766 Portal hypertension: Secondary | ICD-10-CM | POA: Diagnosis present

## 2023-01-26 DIAGNOSIS — D539 Nutritional anemia, unspecified: Secondary | ICD-10-CM | POA: Diagnosis present

## 2023-01-26 DIAGNOSIS — Z9181 History of falling: Secondary | ICD-10-CM | POA: Diagnosis not present

## 2023-01-26 DIAGNOSIS — K7469 Other cirrhosis of liver: Secondary | ICD-10-CM | POA: Diagnosis not present

## 2023-01-26 DIAGNOSIS — Z87891 Personal history of nicotine dependence: Secondary | ICD-10-CM | POA: Diagnosis not present

## 2023-01-26 DIAGNOSIS — K7460 Unspecified cirrhosis of liver: Secondary | ICD-10-CM | POA: Diagnosis present

## 2023-01-26 DIAGNOSIS — Z823 Family history of stroke: Secondary | ICD-10-CM | POA: Diagnosis not present

## 2023-01-26 DIAGNOSIS — K219 Gastro-esophageal reflux disease without esophagitis: Secondary | ICD-10-CM | POA: Diagnosis present

## 2023-01-26 DIAGNOSIS — I129 Hypertensive chronic kidney disease with stage 1 through stage 4 chronic kidney disease, or unspecified chronic kidney disease: Secondary | ICD-10-CM | POA: Diagnosis present

## 2023-01-26 DIAGNOSIS — K7682 Hepatic encephalopathy: Secondary | ICD-10-CM | POA: Diagnosis present

## 2023-01-26 DIAGNOSIS — K625 Hemorrhage of anus and rectum: Secondary | ICD-10-CM | POA: Diagnosis present

## 2023-01-26 DIAGNOSIS — Z79890 Hormone replacement therapy (postmenopausal): Secondary | ICD-10-CM | POA: Diagnosis not present

## 2023-01-26 DIAGNOSIS — D696 Thrombocytopenia, unspecified: Secondary | ICD-10-CM | POA: Diagnosis present

## 2023-01-26 DIAGNOSIS — E860 Dehydration: Secondary | ICD-10-CM | POA: Diagnosis present

## 2023-01-26 DIAGNOSIS — F0150 Vascular dementia without behavioral disturbance: Secondary | ICD-10-CM | POA: Diagnosis present

## 2023-01-26 DIAGNOSIS — E039 Hypothyroidism, unspecified: Secondary | ICD-10-CM | POA: Diagnosis present

## 2023-01-26 DIAGNOSIS — E874 Mixed disorder of acid-base balance: Secondary | ICD-10-CM | POA: Diagnosis present

## 2023-01-26 DIAGNOSIS — G4733 Obstructive sleep apnea (adult) (pediatric): Secondary | ICD-10-CM | POA: Diagnosis present

## 2023-01-26 LAB — RAPID URINE DRUG SCREEN, HOSP PERFORMED
Amphetamines: NOT DETECTED
Barbiturates: NOT DETECTED
Benzodiazepines: NOT DETECTED
Cocaine: NOT DETECTED
Opiates: NOT DETECTED
Tetrahydrocannabinol: NOT DETECTED

## 2023-01-26 LAB — I-STAT VENOUS BLOOD GAS, ED
Acid-base deficit: 5 mmol/L — ABNORMAL HIGH (ref 0.0–2.0)
Bicarbonate: 17.2 mmol/L — ABNORMAL LOW (ref 20.0–28.0)
Calcium, Ion: 1.15 mmol/L (ref 1.15–1.40)
HCT: 32 % — ABNORMAL LOW (ref 39.0–52.0)
Hemoglobin: 10.9 g/dL — ABNORMAL LOW (ref 13.0–17.0)
O2 Saturation: 90 %
Potassium: 3.7 mmol/L (ref 3.5–5.1)
Sodium: 142 mmol/L (ref 135–145)
TCO2: 18 mmol/L — ABNORMAL LOW (ref 22–32)
pCO2, Ven: 24.1 mmHg — ABNORMAL LOW (ref 44–60)
pH, Ven: 7.462 — ABNORMAL HIGH (ref 7.25–7.43)
pO2, Ven: 54 mmHg — ABNORMAL HIGH (ref 32–45)

## 2023-01-26 LAB — TSH: TSH: 1.37 u[IU]/mL (ref 0.350–4.500)

## 2023-01-26 LAB — CBG MONITORING, ED: Glucose-Capillary: 92 mg/dL (ref 70–99)

## 2023-01-26 LAB — ETHANOL: Alcohol, Ethyl (B): <10 mg/dL (ref <10)

## 2023-01-26 LAB — PROTIME-INR
INR: 1.2 (ref 0.8–1.2)
Prothrombin Time: 14.7 seconds (ref 11.4–15.2)

## 2023-01-26 LAB — SARS CORONAVIRUS 2 BY RT PCR: SARS Coronavirus 2 by RT PCR: NEGATIVE

## 2023-01-26 LAB — CK: Total CK: 672 U/L — ABNORMAL HIGH (ref 49–397)

## 2023-01-26 MED ORDER — LEVOTHYROXINE SODIUM 50 MCG PO TABS
50.0000 ug | ORAL_TABLET | Freq: Every day | ORAL | Status: DC
  Administered 2023-01-26 – 2023-01-31 (×6): 50 ug via ORAL
  Filled 2023-01-26 (×3): qty 1
  Filled 2023-01-26: qty 2
  Filled 2023-01-26 (×2): qty 1

## 2023-01-26 MED ORDER — LACTATED RINGERS IV BOLUS
500.0000 mL | Freq: Once | INTRAVENOUS | Status: AC
  Administered 2023-01-26: 500 mL via INTRAVENOUS

## 2023-01-26 MED ORDER — PANTOPRAZOLE SODIUM 40 MG PO TBEC
40.0000 mg | DELAYED_RELEASE_TABLET | Freq: Every day | ORAL | Status: DC
  Administered 2023-01-26 – 2023-01-31 (×6): 40 mg via ORAL
  Filled 2023-01-26 (×6): qty 1

## 2023-01-26 MED ORDER — ENOXAPARIN SODIUM 40 MG/0.4ML IJ SOSY
40.0000 mg | PREFILLED_SYRINGE | Freq: Every day | INTRAMUSCULAR | Status: DC
  Administered 2023-01-26 – 2023-01-31 (×6): 40 mg via SUBCUTANEOUS
  Filled 2023-01-26 (×6): qty 0.4

## 2023-01-26 MED ORDER — SODIUM CHLORIDE 0.9 % IV BOLUS
500.0000 mL | Freq: Once | INTRAVENOUS | Status: AC
  Administered 2023-01-26: 500 mL via INTRAVENOUS

## 2023-01-26 MED ORDER — ACETAMINOPHEN 650 MG RE SUPP: 650.0000 mg | Freq: Four times a day (QID) | RECTAL | Status: DC | PRN

## 2023-01-26 MED ORDER — ACETAMINOPHEN 325 MG PO TABS
650.0000 mg | ORAL_TABLET | Freq: Four times a day (QID) | ORAL | Status: DC | PRN
  Administered 2023-01-27 – 2023-01-30 (×3): 650 mg via ORAL
  Filled 2023-01-26 (×3): qty 2

## 2023-01-26 MED ORDER — LACTULOSE 10 GM/15ML PO SOLN
10.0000 g | Freq: Once | ORAL | Status: AC
  Administered 2023-01-26: 10 g via ORAL
  Filled 2023-01-26: qty 30

## 2023-01-26 MED ORDER — ALBUTEROL SULFATE (2.5 MG/3ML) 0.083% IN NEBU: 2.5000 mg | INHALATION_SOLUTION | Freq: Four times a day (QID) | RESPIRATORY_TRACT | Status: DC | PRN

## 2023-01-26 MED ORDER — RIFAXIMIN 550 MG PO TABS
550.0000 mg | ORAL_TABLET | Freq: Two times a day (BID) | ORAL | Status: DC
  Administered 2023-01-26 – 2023-01-31 (×11): 550 mg via ORAL
  Filled 2023-01-26 (×11): qty 1

## 2023-01-26 MED ORDER — CHLORHEXIDINE GLUCONATE CLOTH 2 % EX PADS
6.0000 | MEDICATED_PAD | Freq: Every day | CUTANEOUS | Status: DC
  Administered 2023-01-26: 6 via TOPICAL

## 2023-01-26 MED ORDER — LACTULOSE 10 GM/15ML PO SOLN
20.0000 g | Freq: Three times a day (TID) | ORAL | Status: DC
  Administered 2023-01-26 – 2023-01-27 (×4): 20 g via ORAL
  Filled 2023-01-26 (×4): qty 30

## 2023-01-26 NOTE — Addendum Note (Signed)
Addended by: Scheryl Darter on: 01/26/2023 09:17 AM   Modules accepted: Orders

## 2023-01-26 NOTE — ED Notes (Signed)
SWOT transporting pt upstairs

## 2023-01-26 NOTE — Hospital Course (Addendum)
  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 Had a prior episode on hepatic encephalopathy in 2023. Patient states he tried to cut back on his lactulose dosing to see if he can. Prior to cutting back, he was having about 2 loose stools at home with lactulose 30 g TID and rifaxmin 550 mg BID. Restarted lactulose and rifaximin and his mentation has improved and returned to baseline. Thrombocytopenia remain stable ***. Patient to continue care at [facility] ***.  Blood per rectum He had one episode of some red blood in pants when he went to bathroom on 4/6. Vitals stable and Hgb stable. Differentials include hemorrhoid versus possible rectal varices or diverticulosis. On rectal exam, did not appreciate external or internal hemorrhoids. 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 CT abd in 2022 did not show signs of diverticulosis. No further episodes of bleeding noted and Hgb has remained stable. ***  Macrocytic anemia Hgb stable at baseline of 10-12. Normal folate and B12 levels. No further bleeding noted. Macrocytic anemia likely in setting of his cirrhosis. ***  HTN Patient on spironolactone 25 mg daily at home. Was previously on carvedilol but stopped by his PCP on prior note. Continued home spironolactone.    Concern for obstructive lung disease Patient noted to be on Breo ellipta daily and albuterol inhaler PRN for wheezing. No formal PFTs noted on chart review. No signs of respiratory distress and stable on room air. Continued home Breo and albuterol.    CKD 3A Kidney function is stable and at baseline.   Hypothyroidism Normal TSH. Continue home levothyroxine 50 mcg daily.    Nocturia Continue home oxybutynin 2.5 mg qhs. No acute concerns or changes.    GERD Continue home Protonix 40 mg daily.     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 Joseph [facility] at [facility]. Chronic and stable at this time. Continue outpatient f/u with Dr. Joseph Art.

## 2023-01-26 NOTE — Progress Notes (Signed)
OT Cancellation Note  Patient Details Name: Justin Hampton MRN: [id number] DOB: 1946-04-05   Cancelled Treatment:    Reason Eval/Treat Not Completed: Other (comment) (OT order received, per RN, pt not d/cing today, will follow up for evaluation 01/27/2023 as schedule permits.)  Renaye Rakers, OTD, OTR/L [employer] [facility]erred Acute [facility] [phone] - [place] 01/26/2023, 5:11 PM

## 2023-01-26 NOTE — Evaluation (Signed)
Physical Therapy Evaluation Patient Details Name: Justin Hampton MRN: [id number] DOB: 31-Jan-1946 Today's Date: 01/26/2023  History of Present Illness  Pt is a 77 y.o. male who presented 01/25/23 with AMS and several falls over the past few days. MRI negative for acute intracranial abnormality. Admitted with acute encephalopathy. PMH: hemophilia B, vascular dementia, hepatic encephalopathy, cirrhosis due to hepatitis C,  hypothyroidism, OSA, GERD, and cervical DDD   Clinical Impression  Pt presents with condition above and deficits mentioned below, see PT Problem List. PTA, he was mod I using a SPC, living alone in a 2-level house with 4 STE and x23 stairs to access his bedroom and shower upstairs. Pt is currently only requiring minA to transition supine to sit, likely due to the awkwardness of the stretcher, but is otherwise only needing min guard assist for safety with transfers, gait bouts with a SPC, and negotiating stairs with bil UE support. He displays deficits in gross strength, balance, power, and processing speed. Called pt's daughter after the session and she reports she feels like he is ~80% back to his baseline cognitive status. Discussed with pt and daughter (via phone after session) that pt is a fall risk and of this PT's recs for pt to sit to bathe/dress for safety, have 24/7 supervision initially to ensure his cognition gets back to baseline, keep a phone on him at all times or get a life alert button or place a camera in the house for emergencies only etc in case pt were to fall at home, and of PT follow-up options. Pt and daughter verbalized understanding of all of this and pt declined SNF for rehab, choosing instead to go home with HHPT/OT and [facility] aide if possible. The daughter reported she can stay with him up until Sunday evening and his neighbors can check on him occasionally. Will continue to follow acutely.     Recommendations for follow up therapy are one component of a  multi-disciplinary discharge planning process, led by the attending physician.  Recommendations may be updated based on patient status, additional functional criteria and insurance authorization.  Follow Up Recommendations       Assistance Recommended at Discharge Frequent or constant Supervision/Assistance (initially)  Patient can return home with the following  A little help with bathing/dressing/bathroom;Assistance with cooking/housework;Direct supervision/assist for financial management;Direct supervision/assist for medications management;Assist for transportation;Help with stairs or ramp for entrance    Equipment Recommendations BSC/3in1  Recommendations for Other Services  OT consult    Functional Status Assessment Patient has had a recent decline in their functional status and demonstrates the ability to make significant improvements in function in a reasonable and predictable amount of time.     Precautions / Restrictions Precautions Precautions: Fall Restrictions Weight Bearing Restrictions: No      Mobility  Bed Mobility Overal bed mobility: Needs Assistance Bed Mobility: Supine to Sit, Sit to Supine     Supine to sit: Min assist, HOB elevated Sit to supine: Min guard, HOB elevated   General bed mobility comments: Extra time and pt needing HHA minA to pull his trunk up to sit edge of stretcher. Min guard for safety to return to supine    Transfers Overall transfer level: Needs assistance Equipment used: Straight cane Transfers: Sit to/from Stand Sit to Stand: Min guard           General transfer comment: Pt coming to stand 1x from edge of stretcher and x2 from chair without armrests. Extra time to power up from a  lower surface with mild posterior sway, but no LOB, min guard for safety    Ambulation/Gait Ambulation/Gait assistance: Min guard Gait Distance (Feet): 240 Feet Assistive device: Rolling walker (2 wheels), Straight cane Gait Pattern/deviations:  Step-through pattern, Decreased stride length, Trunk flexed Gait velocity: reduced Gait velocity interpretation: <1.8 ft/sec, indicate of risk for recurrent falls   General Gait Details: Pt ambulating initially with his SPC, taking slow, small steps. While mildly unsteady with a slight sway, pt had no overt LOB, min guard for safety. Attempted using RW but pt needing cues to keep it on the ground and remain within the RW, min guard for safety  Stairs Stairs: Yes Stairs assistance: Min guard Stair Management: One rail Right, One rail Left, With cane, Step to pattern, Forwards Number of Stairs: 4 General stair comments: Ascends and descends a step stool with a rail on one side and his SPC in the other hand to simulate stairs. Slow, but no LOB, min guard for safety  Wheelchair Mobility    Modified Rankin (Stroke Patients Only)       Balance Overall balance assessment: Needs assistance Sitting-balance support: No upper extremity supported, Feet supported, Feet unsupported Sitting balance-Leahy Scale: Fair Sitting balance - Comments: LOB posteriorly when sitting on uneven surface without foot support. Able to reach feet to tie shoes though without LOB when sitting in chair with feet support   Standing balance support: Single extremity supported, Bilateral upper extremity supported, During functional activity, Reliant on assistive device for balance Standing balance-Leahy Scale: Poor Standing balance comment: Reliant on SCP vs RW for support                             Pertinent Vitals/Pain Pain Assessment Pain Assessment: Faces Faces Pain Scale: No hurt Pain Intervention(s): Monitored during session    Home Living Family/patient expects to be discharged to:: Private residence Living Arrangements: Alone Available Help at Discharge: Family;Neighbor;Available PRN/intermittently Type of Home: House Home Access: Stairs to enter Entrance Stairs-Rails: Left  (ascending) Entrance Stairs-Number of Steps: 4 Alternate Level Stairs-Number of Steps: 23 Home Layout: Two level;Bed/bath upstairs;1/2 bath on main level Home Equipment: Cane - single point;Grab bars - toilet;Grab bars - tub/shower;Rolling Walker (2 wheels)      Prior Function Prior Level of Function : Independent/Modified Independent;Driving;History of Falls (last six months)             Mobility Comments: Uses SPC, reports x1 fall in past 6 months       Hand Dominance        Extremity/Trunk Assessment   Upper Extremity Assessment Upper Extremity Assessment: Defer to OT evaluation    Lower Extremity Assessment Lower Extremity Assessment: Generalized weakness    Cervical / Trunk Assessment Cervical / Trunk Assessment: Kyphotic  Communication   Communication: No difficulties  Cognition Arousal/Alertness: Awake/alert Behavior During Therapy: WFL for tasks assessed/performed Overall Cognitive Status: Impaired/Different from baseline Area of Impairment: Problem solving                             Problem Solving: Slow processing General Comments: Pt slow to process and respond intermittently. Called daughter after session and daughter also noted slow processing, reporting she feels like pt is ~80% back to his baseline cognitively.        General Comments General comments (skin integrity, edema, etc.): Discussed with pt and daughter (via phone after session) that  pt is a fall risk and would recommend more close 24/7 supervision initially to ensure his cognition gets back to baseline, which the daughter reported she can stay with him up until Sunday evening. Pt acknowledged he is a fall risk and declined SNF, choosing to go home with HHPT/OT and [place] aide if possible. Pt and daughter verbalized understanding of the recs to sit to dress/bathe and for pt to keep a phone on him at all times or get a life alert button or place a camera in the house for emergencies only  etc    Exercises     Assessment/Plan    PT Assessment Patient needs continued PT services  PT Problem List Decreased strength;Decreased activity tolerance;Decreased balance;Decreased mobility;Decreased cognition       PT Treatment Interventions DME instruction;Gait training;Stair training;Functional mobility training;Therapeutic activities;Therapeutic exercise;Balance training;Neuromuscular re-education;Cognitive remediation;Patient/family education    PT Goals (Current goals can be found in the Care Plan section)  Acute Rehab PT Goals Patient Stated Goal: to go home PT Goal Formulation: With patient/family Time For Goal Achievement: 02/09/23 Potential to Achieve Goals: Good    Frequency Min 3X/week     Co-evaluation               AM-PAC PT "6 Clicks" Mobility  Outcome Measure Help needed turning from your back to your side while in a flat bed without using bedrails?: A Little Help needed moving from lying on your back to sitting on the side of a flat bed without using bedrails?: A Little Help needed moving to and from a bed to a chair (including a wheelchair)?: A Little Help needed standing up from a chair using your arms (e.g., wheelchair or bedside chair)?: A Little Help needed to walk in hospital room?: A Little Help needed climbing 3-5 steps with a railing? : A Little 6 Click Score: 18    End of Session Equipment Utilized During Treatment: Gait belt Activity Tolerance: Patient tolerated treatment well Patient left: in bed;with call bell/phone within reach Nurse Communication: Mobility status PT Visit Diagnosis: Unsteadiness on feet (R26.81);Other abnormalities of gait and mobility (R26.89);Muscle weakness (generalized) (M62.81);History of falling (Z91.81)    Time: [id number] PT Time Calculation (min) (ACUTE ONLY): 36 min   Charges:   PT Evaluation $PT Eval Moderate Complexity: 1 Mod PT Treatments $Therapeutic Activity: 8-22 mins        Moishe Spice,  PT, DPT Acute Rehabilitation Services  Office: [phone]   Orvan Falconer 01/26/2023, 3:50 PM

## 2023-01-26 NOTE — H&P (Signed)
Date: 01/26/2023               Patient Name:  Justin Hampton MRN: [id number]  DOB: May 03, 1946 Age / Sex: 77 y.o., male   PCP: Bartholome Bill, MD         Medical Service: Internal Medicine Teaching Service         Attending Physician: Dr. Campbell Riches, MD    First Contact: Dr. Angelique Blonder Pager: [phone]  Second Contact: Dr. Christiana Fuchs Pager: [phone]       After Hours (After 5p/  First Contact Pager: [phone]  weekends / holidays): Second Contact Pager: [phone]   Chief Complaint: AMS  History of Present Illness:   Justin Hampton is a 77 year old person living with hemophilia B, cirrhosis 2/2 hepatitis C, hepatic encephalopathy, hypothyroidism, OSA, GERD, and cervical DDD who presents after 1 day of weakness and confusion.   He states that he was doing well on Tuesday per his usual baseline. Was able to move around fine with his assistive device (uses walker and cane depending on amount of walking he needs to do) and had good PO intake. Yesterday, after waking up, he felt weak and wobbly. He was not wobbling to any particular side, just generally felt this way. He also began feeling progressively more confused and does not remember a lot of what happened. There were a couple of times at home where he sat down on to the floor because he was not able to stay standing. Denies falling to the floor, LOC, head trauma. Denies fevers, chills, cough, SHOB, chest pain, palpitations, abdominal pain, diarrhea, urinary changes.   Ultimately, he walked over to his neighbor's home because he was not feeling at his baseline. Per neighbor's report, patient came to his residence and was speaking about his deceased spouse, acting abnormally, and speaking nonsensical thoughts.   Justin Hampton notes that similar episodes have happened in the past when he does not take his home lactulose. He notes that he has not taken his lactulose for the past couple of days, no particular reason as to why  not. He has been adherent with his rifaximin. He does have enough supply at home of both his rifaximin and lactulose. Typically, he takes lactulose 30g three times daily along with his rifaximin twice daily. He usually has about 3 bowel movements when he adheres to these medications. Over the past couple of days when not taking lactulose, he still has had sporadic bowel movements but not as often as with his lactulose.   In terms of his weakness, he denies any focal weakness. However, does endorse generalized weakness. He states that his weakness is more pronounced when he uses his cane but typically feels more stable when using his walker.   Spoke with daughter, Judson Roch, via phone call. States that patient typically does fine at home but over the past couple of years, has had episodes of confusion requiring ED visits or hospital admission, most recently in 10/2021. She states that he develops confusion fairly quickly when he is not adherent with his lactulose. Her and her sister have tried to persuade patient to move to a senior living community or an ALF, but these attempts have not been successful as he would like to maintain his independence. She lives in [place] and her sister lives in [place]. Patient has sisters that live in [place]. Judson Roch is planning to come visit today.   ED course: Vitals stable. CBC with mild thrombocytopenia (145). CMP  with glucose 92, bicarb 19, Cr 1.36 (baseline ~1.1-1.2), albumin 3.4, no AG. Ammonia 48. VBG with mild respiratory alkalosis and compensatory metabolic acidosis. Normal coagulation studies. Ethanol and TSH normal. CK mildly elevated in 600s. CXR negative. CT head negative. MRI brain with no acute abnormalities but shows generalized cerebral atrophy.    Meds:  No current facility-administered medications on file prior to encounter.   Current Outpatient Medications on File Prior to Encounter  Medication Sig Dispense Refill   fluticasone furoate-vilanterol (BREO  ELLIPTA) 100-25 MCG/ACT AEPB Inhale 1 puff into the lungs daily.     oxybutynin (DITROPAN-XL) 5 MG 24 hr tablet Take 5 mg by mouth at bedtime.     rifaximin (XIFAXAN) 550 MG TABS tablet Take 550 mg by mouth 2 (two) times daily.     acetaminophen (TYLENOL) 500 MG tablet Take 500 mg by mouth every 6 (six) hours as needed for mild pain.     albuterol (PROVENTIL HFA;VENTOLIN HFA) 108 (90 Base) MCG/ACT inhaler Inhale 1-2 puffs into the lungs every 6 (six) hours as needed for wheezing or shortness of breath.     coagulation factor IX, recomb, (BENEFIX) 1000 units injection Inject 4,000 Units into the vein See admin instructions. Whenever having surgery  As directed by hematologist     Emollient (AQUAPHOR EX) Apply 1 application topically 2 (two) times daily as needed (for dry skin).      HYDROcodone-acetaminophen (NORCO/VICODIN) 5-325 MG tablet Take 1 tablet by mouth every 6 (six) hours as needed for moderate pain or severe pain.     lactulose, encephalopathy, (CHRONULAC) 10 GM/15ML SOLN Take 30 g by mouth in the morning, at noon, and at bedtime.     levothyroxine (SYNTHROID, LEVOTHROID) 50 MCG tablet Take 50 mcg by mouth daily.     magnesium oxide (MAG-OX) 400 (241.3 Mg) MG tablet Take 400 mg by mouth daily.     pantoprazole (PROTONIX) 40 MG tablet Take 40 mg by mouth daily.  2   spironolactone (ALDACTONE) 25 MG tablet Take 25 mg by mouth daily.     traMADol (ULTRAM) 50 MG tablet Take 50 mg by mouth every 8 (eight) hours as needed for moderate pain or severe pain.      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   Cirrhosis    Diverticulosis    GERD (gastroesophageal reflux disease)    Hemophilia    Hemophilia B    Hepatic encephalopathy 04/12/2012   Hepatitis C    treated   Hypoglycemia    Hypothyroidism     Family History (obtained from most recent PCP visit): -Mother: CHF, heart disease -Father: HTN, thyroid disease, heart disease, -Brother: T2DM, heart disease, clotting disorder -Paternal Uncle: cancer -Sister: Cancer -Other sister: ovarian cancer, breast cancer  Social History (obtained from most recent PCP visit):  -Widowed, 2 children -Retired -Lives alone -Independent in ADLs and iADLs -Good outpatient support system -uses cane or walker at home -27 pack-year smoking hx, quit 27 years ago -No recreational drug use -Very rare alcohol use  Review of Systems: A complete ROS was negative except as per HPI.   Physical Exam: Blood pressure 122/64, pulse 66, temperature 97.7 F (36.5 C),  temperature source Oral, resp. rate 14, height 5\' 10"  (1.778 m), weight 93 kg, SpO2 98 %. Physical Exam Constitutional:      Appearance: Normal appearance.     Comments: Chronically ill-appearing pleasant elderly gentleman, sitting up in bed, NAD.  HENT:     Head: Normocephalic and atraumatic.     Nose: Nose normal. No congestion.     Mouth/Throat:     Mouth: Mucous membranes are dry.     Pharynx: Oropharynx is clear. No oropharyngeal exudate.  Eyes:     General: No scleral icterus.    Extraocular Movements: Extraocular movements intact.     Conjunctiva/sclera: Conjunctivae normal.     Pupils: Pupils are equal, round, and reactive to light.  Cardiovascular:     Rate and Rhythm: Normal rate and regular rhythm.     Pulses: Normal pulses.     Heart sounds: Normal heart sounds. No murmur heard.    No friction rub. No gallop.  Pulmonary:     Effort: Pulmonary effort is normal.     Breath sounds: Normal breath sounds. No wheezing, rhonchi or rales.  Abdominal:     General: Bowel sounds are normal. There is no distension.     Palpations: Abdomen is soft.     Tenderness: There is no  abdominal tenderness. There is no guarding or rebound.     Comments: No fluid wave noted.   Musculoskeletal:        General: No swelling. Normal range of motion.     Cervical back: Normal range of motion.  Skin:    General: Skin is warm and dry.  Neurological:     General: No focal deficit present.     Mental Status: He is alert and oriented to person, place, and time.     Comments: Oriented to person, place, time, and reason for admission. Generalized weakness. No asterixis on exam.   Psychiatric:        Mood and Affect: Mood normal.        Behavior: Behavior normal.     EKG: personally reviewed my interpretation is normal sinus rhythm, low voltage.  MR BRAIN WO CONTRAST Result Date: 01/26/2023 IMPRESSION: 1. No acute intracranial abnormality. 2. Nonspecific generalized cerebral hemisphere atrophy, with brainstem and cerebellum relatively spared. Underlying confluent white matter T2/FLAIR hyperintensity which is also nonspecific but most commonly due to small vessel disease. Cerebral Atrophy (ICD10-G31.9). Electronically Signed   By: Genevie Ann M.D.   On: 01/26/2023 05:44   DG Chest Portable 1 View Result Date: 01/26/2023 IMPRESSION: No active disease. Electronically Signed   By: Anner Crete M.D.   On: 01/26/2023 03:50   CT Head Wo Contrast IMPRESSION: 1. No acute intracranial abnormality. Stable senescent change. Electronically Signed   By: Fidela Salisbury M.D.   On: 01/25/2023 23:58     Assessment & Plan by Problem: Principal Problem:   Acute encephalopathy Active Problems:   Hemophilia B   Hepatic encephalopathy   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)   CKD (chronic kidney disease), stage III   Hypothyroidism (acquired)   Gastroesophageal reflux disease   Degenerative disc disease, cervical   Nocturia  Hepatic encephalopathy Cirrhosis 2/2 hepatitis C Patient with history of cirrhosis 2/2 hepatitis C and with known hepatic encephalopathy. He typically does well at  home living independently although has intermittent episodes of confusion from hepatic encephalopathy, last episode 10/2021. Appears that this particular episode was caused by nonadherence with lactulose at home for the  past couple of days. Thus far, labs and imaging have been reassuring. He received a dose of lactulose in the ED with subsequent BM. During my encounter, he is fully oriented and feeling much better, states that he feels he is at his baseline. However, he does endorse generalized weakness. We participated in shared decision making and agreed to have him stay and work with physical therapy to assess strength/gait/balance while here. Also, will resume his lactulose while here. I do believe that he would benefit from more assistance at home. Unclear if he would be open to transitioning to a senior living facility or ALF, but at the least would benefit from a [facility] aide to help with ensuring he takes his home medications as prescribed. -lactulose 20g TID -rifaximin 550mg  BID -2g sodium diet -delirium precautions -fall precautions -PT/OT eval  Obstructive lung disease? Per chart review and recent PCP note, patient on breo ellipta daily and albuterol inhaler prn for wheezing. No formal PFTs noted in chart and no formal diagnosis of asthma or COPD noted either. He is saturating well on RA currently and not in any respiratory distress. -continue home albuterol and breo ellipta  CKD IIIA Baseline Cr ~1.1-1.2. On arrival, Cr 1.36 with GFR 54. Kidney function stable and at baseline. -intermittently trend kidney function to ensure stability -encourage PO intake -renally dose medications as indicated -monitor UOP  Hypothyroidism Takes synthroid 2mcg daily at home. TSH normal here. Chronic and stable. -continue synthroid 52mcg daily  Nocturia Previously on flomax for management. Recent PCP note mentions discontinuing flomax and instead starting oxybutynin 2.5mg  qhs. -continue oxybutynin 2.5mg   qhs  OSA -CPAP qhs  GERD -continue home protonix 40mg  daily  Hemophilia B Patient living with hemophilia B. Follows Dr. Sherral Hammers at [facility] (Heme/Onc). Continue outpatient follow up as recommended by Dr. Sherral Hammers. This issue is chronic and stable.   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 This issue is chronic and stable. -continue outpatient PT -PT/OT while here  Dispo: Admit patient to Observation with expected length of stay less than 2 midnights.  Signed: Virl Axe, MD 01/26/2023, 12:00 PM  Pager: [phone] After 5pm on weekdays and 1pm on weekends: On Call pager: [phone]

## 2023-01-26 NOTE — ED Provider Notes (Signed)
[facility] Provider Note  CSN: [id number] Arrival date & time: 01/25/23 2037  Chief Complaint(s) Altered Mental Status  HPI Justin Hampton is a 76 y.o. male with past medical history as below, significant for hemophilia B, vascular dementia, hepatic encephalopathy, cirrhosis due to hepatitis C, who presents to the ED with complaint of AMS.  Last known normal was approximately 8 PM yesterday evening.  Patient was reported by neighbor to come to the residence and was speaking to his deceased spouse, acting abnormally, speaking nonsensical thoughts.  Intermittent confusion over past 24 hours per daughter at bedside.  Patient not at baseline, missed multiple doses of lactulose and rifaximin.  Patient also reports that he has had a few falls over the past few days, unsure if he hit his head.  Patient report that he found himself on the floor at some point today and was unsure if he fell or how he ended up on the floor but knew that he could not get up from the floor because he is very weak.  Denies any numbness or tingling to extremities that seems new, no speech disturbance, no vision disturbance.  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   Cirrhosis    Diverticulosis    GERD (gastroesophageal reflux disease)    Hemophilia    Hemophilia B   Hepatic encephalopathy 04/12/2012   Hepatitis C    treated   Hypoglycemia    Hypothyroidism    Patient Active Problem List   Diagnosis Date Noted   Hypotension 04/01/2021   Lactic acidosis 04/01/2021   Loose stools 04/01/2021   AKI (acute kidney injury) 04/01/2021   Syncope 04/01/2021   Near syncope 03/31/2021   Hypoglycemia 10/31/2018   Impaired functional mobility, balance, gait, and endurance 10/31/2018   Dilation of pancreatic duct 10/02/2018   Hepatitis B core antibody  positive 09/28/2018   Chronic hepatitis C with cirrhosis 09/28/2018   History of encephalopathy 09/28/2018   Vascular dementia without behavioral disturbance    History of hepatitis C    Hypothyroidism (acquired)    Gastroesophageal reflux disease    Aneurysm, cerebral, nonruptured    AMS (altered mental status) 08/17/2018   Gait disturbance 08/02/2018   Mild memory disturbance 08/02/2018   Hepatic encephalopathy 07/13/2018   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[place]) 07/13/2018   CKD (chronic kidney disease), stage III 07/13/2018   Cervical stenosis of spine 05/29/2018   Degenerative disc disease, cervical 05/29/2018   Iron deficiency anemia due to chronic blood loss 09/12/2017   Rectal bleeding 06/06/2017   Acute renal failure (ARF) 06/06/2017   Hyponatremia 06/06/2017   Hyperkalemia 06/06/2017   Anemia 06/06/2017   GI bleeding 06/06/2017   Hemophilia B    Other dental procedure status 05/22/2017   Family history of CHF (congestive heart failure) February 01, 2017   Family history of sudden death February 01, 2017   Dyspnea 2016-07-20   Other disorder of circulatory system 04/12/2012   Phlebolithiasis 04/12/2012   Abnormal prostate by palpation 04/11/2012   Benign neoplasm of colon 04/11/2012   Degenerative joint disease of pelvic region 04/11/2012   Disorder of prostate 04/11/2012   Iron excess 04/11/2012   Lumbosacral spondylosis without myelopathy 04/11/2012   Nocturia 04/11/2012   Orthostatic hypotension 04/11/2012   Osteoarthritis of both hips 04/11/2012   Osteoarthritis  of lumbar spine 04/11/2012   Left sided sciatica 04/11/2012   History of colonic polyps 10/03/2011   Home Medication(s) Prior to Admission medications   Medication Sig Start Date End Date Taking? Authorizing Provider  acetaminophen (TYLENOL) 500 MG tablet Take 500 mg by mouth every 6 (six) hours as needed for mild pain.    [provider]  albuterol (PROVENTIL HFA;VENTOLIN HFA) 108 (90 Base) MCG/ACT  inhaler Inhale 1-2 puffs into the lungs every 6 (six) hours as needed for wheezing or shortness of breath.    [provider]  coagulation factor IX, recomb, (BENEFIX) 1000 units injection Inject 4,000 Units into the vein See admin instructions. Whenever having surgery  As directed by hematologist 07/28/16   [provider]  Emollient (AQUAPHOR EX) Apply 1 application topically 2 (two) times daily as needed (for dry skin).  12/08/17   [provider]  HYDROcodone-acetaminophen (NORCO/VICODIN) 5-325 MG tablet Take 1 tablet by mouth every 6 (six) hours as needed for moderate pain or severe pain. 03/31/21   [provider]  lactulose, encephalopathy, (CHRONULAC) 10 GM/15ML SOLN Take 30 g by mouth in the morning, at noon, and at bedtime. 11/30/20   [provider]  levothyroxine (SYNTHROID, LEVOTHROID) 50 MCG tablet Take 50 mcg by mouth daily.    [provider]  magnesium oxide (MAG-OX) 400 (241.3 Mg) MG tablet Take 400 mg by mouth daily.    [provider]  pantoprazole (PROTONIX) 40 MG tablet Take 40 mg by mouth daily. 01/03/18   [provider]  spironolactone (ALDACTONE) 25 MG tablet Take 25 mg by mouth daily.    [provider]  traMADol (ULTRAM) 50 MG tablet Take 50 mg by mouth every 8 (eight) hours as needed for moderate pain or severe pain. 03/25/21   [provider]                                                                                                                                    Past Surgical History Past Surgical History:  Procedure Laterality Date   APPENDECTOMY     CARPAL TUNNEL RELEASE Bilateral    COLONOSCOPY     ESOPHAGOGASTRODUODENOSCOPY     x 2   ESOPHAGOGASTRODUODENOSCOPY (EGD) WITH PROPOFOL N/A 10/29/2018   Procedure: ESOPHAGOGASTRODUODENOSCOPY (EGD) WITH PROPOFOL;  Surgeon: Irving Copas., MD;  Location: [facility];  Service: Gastroenterology;  Laterality: N/A;   EUS N/A  10/29/2018   Procedure: UPPER ENDOSCOPIC ULTRASOUND (EUS) RADIAL;  Surgeon: Irving Copas., MD;  Location: [facility];  Service: Gastroenterology;  Laterality: N/A;   Family History Family History  Problem Relation Age of Onset   Dementia Mother 63   Heart failure Mother    Osteoarthritis Mother    Hypertension Mother    Hypertension Father    Heart attack Father    Breast cancer Sister    Uterine cancer Sister    Hypertension  Sister    Hemophilia Brother    Heart disease Brother    Hypertension Brother    Hypothyroidism Sister    Osteoarthritis Sister    Heart failure Maternal Grandmother    Stroke Maternal Grandmother    Hypertension Maternal Grandmother    Heart failure Maternal Grandfather    Diabetes Paternal Grandfather    Colon cancer Neg Hx    Esophageal cancer Neg Hx    Inflammatory bowel disease Neg Hx    Liver disease Neg Hx    Pancreatic cancer Neg Hx    Rectal cancer Neg Hx    Stomach cancer Neg Hx     Social History Social History   Tobacco Use   Smoking status: Former    Years: 20    Types: Cigarettes    Quit date: 08/1995    Years since quitting: 27.4   Smokeless tobacco: Never  Vaping Use   Vaping Use: Never used  Substance Use Topics   Alcohol use: Not Currently   Drug use: No   Allergies Morphine and Zolpidem  Review of Systems Review of Systems  Unable to perform ROS: Mental status change  Constitutional:  Negative for chills and fever.  Respiratory:  Negative for shortness of breath.   Gastrointestinal:  Negative for abdominal pain.  Genitourinary:  Negative for dysuria.  Psychiatric/Behavioral:  Positive for confusion and sleep disturbance.     Physical Exam Vital Signs  I have reviewed the triage vital signs BP 134/73   Pulse (!) 58   Temp 98 F (36.7 C) (Oral)   Resp 16   Ht 5\' 10"  (1.778 m)   Wt 93 kg   SpO2 98%   BMI 29.41 kg/m  Physical Exam Vitals and nursing note reviewed.  Constitutional:       General: He is not in acute distress.    Appearance: He is well-developed.  HENT:     Head: Normocephalic and atraumatic.     Right Ear: External ear normal.     Left Ear: External ear normal.     Mouth/Throat:     Mouth: Mucous membranes are moist.  Eyes:     General: No scleral icterus.    Extraocular Movements: Extraocular movements intact.     Pupils: Pupils are equal, round, and reactive to light.  Cardiovascular:     Rate and Rhythm: Normal rate and regular rhythm.     Pulses: Normal pulses.     Heart sounds: Normal heart sounds.  Pulmonary:     Effort: Pulmonary effort is normal. No respiratory distress.     Breath sounds: Normal breath sounds. No wheezing.  Abdominal:     General: Abdomen is flat.     Palpations: Abdomen is soft.     Tenderness: There is no abdominal tenderness. There is no guarding or rebound.  Musculoskeletal:     Right lower leg: No edema.     Left lower leg: No edema.  Skin:    General: Skin is warm and dry.     Capillary Refill: Capillary refill takes less than 2 seconds.  Neurological:     Mental Status: He is alert. He is disoriented and confused.     GCS: GCS eye subscore is 4. GCS verbal subscore is 5. GCS motor subscore is 6.     Cranial Nerves: Cranial nerves 2-12 are intact.     Sensory: Sensation is intact.     Motor: Motor function is intact.     Coordination: Coordination  is intact.     Comments: Gait not tested secondary to patient safety Intermittently hallucinating  Psychiatric:        Attention and Perception: He perceives visual hallucinations.        Mood and Affect: Mood normal.        Behavior: Behavior normal.     ED Results and Treatments Labs (all labs ordered are listed, but only abnormal results are displayed) Labs Reviewed  COMPREHENSIVE METABOLIC PANEL - Abnormal; Notable for the following components:      Result Value   CO2 19 (*)    BUN 37 (*)    Creatinine, Ser 1.36 (*)    Albumin 3.4 (*)    Total  Bilirubin 1.9 (*)    GFR, Estimated 54 (*)    All other components within normal limits  CBC - Abnormal; Notable for the following components:   RBC 3.85 (*)    MCV 106.2 (*)    MCH 35.1 (*)    Platelets 145 (*)    All other components within normal limits  AMMONIA - Abnormal; Notable for the following components:   Ammonia 48 (*)    All other components within normal limits  I-STAT VENOUS BLOOD GAS, ED - Abnormal; Notable for the following components:   pH, Ven 7.462 (*)    pCO2, Ven 24.1 (*)    pO2, Ven 54 (*)    Bicarbonate 17.2 (*)    TCO2 18 (*)    Acid-base deficit 5.0 (*)    HCT 32.0 (*)    Hemoglobin 10.9 (*)    All other components within normal limits  SARS CORONAVIRUS 2 BY RT PCR  PROTIME-INR  ETHANOL  URINALYSIS, ROUTINE W REFLEX MICROSCOPIC  BLOOD GAS, VENOUS  RAPID URINE DRUG SCREEN, HOSP PERFORMED  CK  CBG MONITORING, ED                                                                                                                          Radiology MR BRAIN WO CONTRAST  Result Date: 01/26/2023 CLINICAL DATA:  77 year old male with altered mental status, memory problems, and history of cirrhosis with previous hyperammonemia. EXAM: MRI HEAD WITHOUT CONTRAST TECHNIQUE: Multiplanar, multiecho pulse sequences of the brain and surrounding structures were obtained without intravenous contrast. COMPARISON:  Head CT 01/25/2023 and earlier. FINDINGS: Brain: Generalized cerebral volume loss suspected, nonspecific. No restricted diffusion to suggest acute infarction. No midline shift, mass effect, evidence of mass lesion, ventriculomegaly, extra-axial collection or acute intracranial hemorrhage. Cervicomedullary junction and pituitary are within normal limits. Patchy, confluent bilateral cerebral white matter T2 and FLAIR hyperintensity. No convincing cortical encephalomalacia or chronic cerebral blood products. By 3 Tesla imaging the deep Heena [place] nuclei and brainstem signal  appears symmetric and within normal limits. Unremarkable cerebellum, with seemingly maintained cerebellar volume despite the cerebral hemisphere atrophy. Vascular: Major intracranial vascular flow voids are preserved. Skull and upper cervical spine: Bulky degenerative appearing ligamentous hypertrophy about the odontoid. Otherwise negative  for age visible cervical spine. Visualized bone marrow signal is within normal limits. Sinuses/Orbits: Negative. Other: Mastoids are clear. Grossly normal visible internal auditory structures. Negative visible scalp and face. IMPRESSION: 1. No acute intracranial abnormality. 2. Nonspecific generalized cerebral hemisphere atrophy, with brainstem and cerebellum relatively spared. Underlying confluent white matter T2/FLAIR hyperintensity which is also nonspecific but most commonly due to small vessel disease. Cerebral Atrophy (ICD10-G31.9). Electronically Signed   By: Genevie Ann M.D.   On: 01/26/2023 05:44   DG Chest Portable 1 View  Result Date: 01/26/2023 CLINICAL DATA:  Altered mental status. EXAM: PORTABLE CHEST 1 VIEW COMPARISON:  Chest radiograph dated 10/28/2021. FINDINGS: No focal consolidation, pleural effusion, or pneumothorax. The cardiac silhouette is within normal limits. No acute osseous pathology. IMPRESSION: No active disease. Electronically Signed   By: Anner Crete M.D.   On: 01/26/2023 03:50   CT Head Wo Contrast  Result Date: 01/25/2023 CLINICAL DATA:  Memory loss, confusion EXAM: CT HEAD WITHOUT CONTRAST TECHNIQUE: Contiguous axial images were obtained from the base of the skull through the vertex without intravenous contrast. RADIATION DOSE REDUCTION: This exam was performed according to the departmental dose-optimization program which includes automated exposure control, adjustment of the mA and/or kV according to patient size and/or use of iterative reconstruction technique. COMPARISON:  07/05/2022 FINDINGS: Brain: Normal anatomic configuration. Stable  moderate parenchymal volume loss is commensurate with the patient's age. Stable mild periventricular white matter changes are present likely reflecting the sequela of small vessel ischemia. No abnormal intra or extra-axial mass lesion or fluid collection. No abnormal mass effect or midline shift. No evidence of acute intracranial hemorrhage or infarct. Ventricular size is normal. Cerebellum unremarkable. Vascular: No asymmetric hyperdense vasculature at the skull base. Skull: Intact Sinuses/Orbits: Paranasal sinuses are clear. Orbits are unremarkable. Other: Mastoid air cells and middle ear cavities are clear. IMPRESSION: 1. No acute intracranial abnormality. Stable senescent change. Electronically Signed   By: Fidela Salisbury M.D.   On: 01/25/2023 23:58    Pertinent labs & imaging results that were available during my care of the patient were reviewed by me and considered in my medical decision making (see MDM for details).  Medications Ordered in ED Medications  sodium chloride 0.9 % bolus 500 mL (0 mLs Intravenous Stopped 01/26/23 0342)  lactulose (CHRONULAC) 10 GM/15ML solution 10 g (10 g Oral Given 01/26/23 0604)                                                                                                                                     Procedures Procedures  (including critical care time)  Medical Decision Making / ED Course    Medical Decision Making:    Justin Hampton is a 77 y.o. male with past medical history as below, significant for hemophilia B, vascular dementia, hepatic encephalopathy, cirrhosis due to hepatitis C, who presents to the ED with complaint of AMS.. The complaint involves an  extensive differential diagnosis and also carries with it a high risk of complications and morbidity.  Serious etiology was considered. Ddx includes but is not limited to: Differential diagnoses for altered mental status includes but is not exclusive to alcohol, illicit or prescription  medications, intracranial pathology such as stroke, intracerebral hemorrhage, fever or infectious causes including sepsis, hypoxemia, uremia, trauma, endocrine related disorders such as diabetes, hypoglycemia, thyroid-related diseases, etc.   Complete initial physical exam performed, notably the patient  was no acute distress, resting comfortably, neuroexam is nonfocal.    Reviewed and confirmed nursing documentation for past medical history, family history, social history.  Vital signs reviewed.    Clinical Course as of 01/26/23 0732  Thu Jan 26, 2023  [id number] Creatinine(!): 1.36 Baseline from .7 to 1.3, give IVF 572ml [SG]  0327 Total Bilirubin(!): 1.9 Increased from prior [SG]  0349 BUN(!): 37 Baseline around 30 [SG]  0431 CXR stable, CTH stable [SG]  0604 MRI stable [SG]    Clinical Course User Index [SG] Jeanell Sparrow, DO   Patient encephalopathic, neuroexam is nonfocal. Screening labs ordered in triage, ammonia is mildly elevated, creatinine mildly elevated.  Patient variable compliance with his lactulose and rifaximin. Concern for hepatic encephalopathy, give lactulose, will add on MRI brain.  Add on further lab work.  Including INR. Recommend admission for hepatic encephalopathy, encephalopathy.  Patient and family are in agreement.  Will discuss with hospitalist  Admitted IMTS     Additional history obtained: -Additional history obtained from family -External records from outside source obtained and reviewed including: Chart review including previous notes, labs, imaging, consultation notes including home medications, primary care documentation, prior labs and imaging,   Lab Tests: -I ordered, reviewed, and interpreted labs.   The pertinent results include:   Labs Reviewed  COMPREHENSIVE METABOLIC PANEL - Abnormal; Notable for the following components:      Result Value   CO2 19 (*)    BUN 37 (*)    Creatinine, Ser 1.36 (*)    Albumin 3.4 (*)    Total Bilirubin  1.9 (*)    GFR, Estimated 54 (*)    All other components within normal limits  CBC - Abnormal; Notable for the following components:   RBC 3.85 (*)    MCV 106.2 (*)    MCH 35.1 (*)    Platelets 145 (*)    All other components within normal limits  AMMONIA - Abnormal; Notable for the following components:   Ammonia 48 (*)    All other components within normal limits  I-STAT VENOUS BLOOD GAS, ED - Abnormal; Notable for the following components:   pH, Ven 7.462 (*)    pCO2, Ven 24.1 (*)    pO2, Ven 54 (*)    Bicarbonate 17.2 (*)    TCO2 18 (*)    Acid-base deficit 5.0 (*)    HCT 32.0 (*)    Hemoglobin 10.9 (*)    All other components within normal limits  SARS CORONAVIRUS 2 BY RT PCR  PROTIME-INR  ETHANOL  URINALYSIS, ROUTINE W REFLEX MICROSCOPIC  BLOOD GAS, VENOUS  RAPID URINE DRUG SCREEN, HOSP PERFORMED  CK  CBG MONITORING, ED    Notable for ammonia elevated, creatinine elevated  EKG   EKG Interpretation  Date/Time:  Wednesday January 25 2023 20:41:25 EDT Ventricular Rate:  87 PR Interval:  170 QRS Duration: 76 QT Interval:  380 QTC Calculation: 457 R Axis:   -6 Text Interpretation: Normal sinus rhythm Low voltage QRS  Inferior infarct , age undetermined Abnormal ECG When compared with ECG of 12-Apr-2021 18:45, PREVIOUS ECG IS PRESENT Interpretation limited secondary to artifact Confirmed by Wynona Dove ([id number]) on 01/26/2023 3:28:14 AM         Imaging Studies ordered: I ordered imaging studies including CTH MRI brain I independently visualized the following imaging with scope of interpretation limited to determining acute life threatening conditions related to emergency care; findings noted above, significant for wnl CT, MRI pending I independently visualized and interpreted imaging. I agree with the radiologist interpretation   Medicines ordered and prescription drug management: Meds ordered this encounter  Medications   sodium chloride 0.9 % bolus 500 mL    lactulose (CHRONULAC) 10 GM/15ML solution 10 g    -I have reviewed the patients home medicines and have made adjustments as needed   Consultations Obtained: na   Cardiac Monitoring: The patient was maintained on a cardiac monitor.  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    Final Clinical Impression(s) / ED Diagnoses Final diagnoses:  Hepatic encephalopathy  Mild dehydration     This chart was dictated using voice recognition software.  Despite best efforts to proofread,  errors can occur which can change the documentation meaning.    Jeanell Sparrow, DO 01/26/23 [phone]

## 2023-01-27 DIAGNOSIS — N1831 Chronic kidney disease, stage 3a: Secondary | ICD-10-CM | POA: Diagnosis not present

## 2023-01-27 DIAGNOSIS — G9340 Encephalopathy, unspecified: Secondary | ICD-10-CM | POA: Diagnosis not present

## 2023-01-27 LAB — COMPREHENSIVE METABOLIC PANEL
ALT: 24 U/L (ref 0–44)
AST: 48 U/L — ABNORMAL HIGH (ref 15–41)
Albumin: 2.4 g/dL — ABNORMAL LOW (ref 3.5–5.0)
Alkaline Phosphatase: 52 U/L (ref 38–126)
Anion gap: 6 (ref 5–15)
BUN: 25 mg/dL — ABNORMAL HIGH (ref 8–23)
CO2: 21 mmol/L — ABNORMAL LOW (ref 22–32)
Calcium: 8.7 mg/dL — ABNORMAL LOW (ref 8.9–10.3)
Chloride: 111 mmol/L (ref 98–111)
Creatinine, Ser: 0.97 mg/dL (ref 0.61–1.24)
GFR, Estimated: >60 mL/min (ref >60)
Glucose, Bld: 96 mg/dL (ref 70–99)
Potassium: 3.7 mmol/L (ref 3.5–5.1)
Sodium: 138 mmol/L (ref 135–145)
Total Bilirubin: 1.5 mg/dL — ABNORMAL HIGH (ref 0.3–1.2)
Total Protein: 5.4 g/dL — ABNORMAL LOW (ref 6.5–8.1)

## 2023-01-27 LAB — CBC
HCT: 32.3 % — ABNORMAL LOW (ref 39.0–52.0)
Hemoglobin: 11.4 g/dL — ABNORMAL LOW (ref 13.0–17.0)
MCH: 36.1 pg — ABNORMAL HIGH (ref 26.0–34.0)
MCHC: 35.3 g/dL (ref 30.0–36.0)
MCV: 102.2 fL — ABNORMAL HIGH (ref 80.0–100.0)
Platelets: 120 10*3/uL — ABNORMAL LOW (ref 150–400)
RBC: 3.16 MIL/uL — ABNORMAL LOW (ref 4.22–5.81)
RDW: 12.4 % (ref 11.5–15.5)
WBC: 5.6 10*3/uL (ref 4.0–10.5)
nRBC: 0 % (ref 0.0–0.2)

## 2023-01-27 LAB — GLUCOSE, CAPILLARY: Glucose-Capillary: 100 mg/dL — ABNORMAL HIGH (ref 70–99)

## 2023-01-27 MED ORDER — SPIRONOLACTONE 25 MG PO TABS
25.0000 mg | ORAL_TABLET | Freq: Every day | ORAL | Status: DC
  Administered 2023-01-27 – 2023-01-31 (×5): 25 mg via ORAL
  Filled 2023-01-27 (×5): qty 1

## 2023-01-27 MED ORDER — OXYBUTYNIN CHLORIDE 5 MG PO TABS
5.0000 mg | ORAL_TABLET | Freq: Every day | ORAL | Status: DC
  Administered 2023-01-27 – 2023-01-30 (×4): 5 mg via ORAL
  Filled 2023-01-27 (×4): qty 1

## 2023-01-27 MED ORDER — LACTULOSE 10 GM/15ML PO SOLN
30.0000 g | Freq: Three times a day (TID) | ORAL | Status: DC
  Administered 2023-01-27 – 2023-01-31 (×12): 30 g via ORAL
  Filled 2023-01-27 (×12): qty 60

## 2023-01-27 MED ORDER — FLUTICASONE FUROATE-VILANTEROL 100-25 MCG/ACT IN AEPB
1.0000 | INHALATION_SPRAY | Freq: Every day | RESPIRATORY_TRACT | Status: DC
  Administered 2023-01-27 – 2023-01-31 (×5): 1 via RESPIRATORY_TRACT
  Filled 2023-01-27: qty 28

## 2023-01-27 NOTE — Progress Notes (Signed)
Patient refused CPAP for the night  

## 2023-01-27 NOTE — Progress Notes (Signed)
HD#1 Subjective:   Summary: Justin Hampton is a 77 y.o. 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  Overnight Events: none  Patient is awake, alert and oriented to person, place, time and situation. He is aware of what lead him to be in the hospital. He tried to cut back on his lactulose dosing at home the past few days. He feels better today and understands maintaining adherence to his home meds. No new concerns today. He is agreeable to SNF after our discussion.   Objective:  Vital signs in last 24 hours: Vitals:   01/26/23 1946 01/27/23 0012 01/27/23 0359 01/27/23 0500  BP: 115/74 100/75 105/64   Pulse: 67 62 (!) 58   Resp: 17 17 18    Temp: 98.2 F (36.8 C) 98 F (36.7 C) 98.2 F (36.8 C)   TempSrc: Oral Oral Oral   SpO2: 100% 99% 97%   Weight:    88.1 kg  Height:       Supplemental O2: Room Air SpO2: 97 %   Physical Exam:  Constitutional: chronically ill-appearing male, laying in bed comfortably, in no acute distress HENT: normocephalic atraumatic Neck: supple Cardiovascular: regular rate and rhythm, no LE edema Pulmonary/Chest: normal work of breathing on room air, anterior lung sounds clear to auscultation bilaterally Abdominal: bowel sounds present, soft, non-tender, non-distended MSK: normal bulk and tone Neurological: alert & oriented x 4 to person, place, time and situation Skin: warm and dry Psych: pleasant mood  Filed Weights   01/25/23 2041 01/27/23 0500  Weight: 93 kg 88.1 kg     Intake/Output Summary (Last 24 hours) at 01/27/2023 0622 Last data filed at 01/27/2023 0400 Gross per 24 hour  Intake --  Output 200 ml  Net -200 ml   Net IO Since Admission: 300 mL [01/27/23 0622]  Pertinent Labs:    Latest Ref Rng & Units 01/27/2023    4:56 AM 01/26/2023    6:12 AM 01/25/2023   10:20 PM   CBC  WBC 4.0 - 10.5 K/uL 5.6   7.3   Hemoglobin 13.0 - 17.0 g/dL 60.411.4  54.010.9  98.113.5   Hematocrit 39.0 - 52.0 % 32.3  32.0  40.9   Platelets 150 - 400 K/uL 120   145        Latest Ref Rng & Units 01/27/2023    4:56 AM 01/26/2023    6:12 AM 01/25/2023   10:20 PM  CMP  Glucose 70 - 99 mg/dL 96   92   BUN 8 - 23 mg/dL 25   37   Creatinine 1.910.61 - 1.24 mg/dL 4.780.97   2.951.36   Sodium 621135 - 145 mmol/L 138  142  139   Potassium 3.5 - 5.1 mmol/L 3.7  3.7  3.9   Chloride 98 - 111 mmol/L 111   111   CO2 22 - 32 mmol/L 21   19   Calcium 8.9 - 10.3 mg/dL 8.7   9.4   Total Protein 6.5 - 8.1 g/dL 5.4   7.0   Total Bilirubin 0.3 - 1.2 mg/dL 1.5   1.9   Alkaline Phos 38 - 126 U/L 52   69   AST 15 - 41 U/L 48   38   ALT 0 - 44 U/L 24   24     Imaging: No results found.  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 Had a prior episode on hepatic encephalopathy in 2023. Patient states he tried to cut back on his lactulose dosing to see if he can. Prior to cutting back, he was having about 2 loose stools at home with lactulose 30 g TID and rifaxmin 550 mg BID. Restarted lactulose and rifaximin and his mentation has improved and at baseline. A&O x4 today. Platelets remain fairly stable for now without evidence of  active bleeding. PT/OT evaluated given generalized weakness and recommend SNF which patient is agreeable with today. Appreciate SW starting the SNF process.  -continue home lactulose 30 g TID -continue home rifaximin 550 mg BID -trend CBC -2 g sodium diet -delirium precautions -fall precautions -pending SNF  HTN Patient on spironolactone 25 mg daily at home. Was previously on carvedilol but stopped by his PCP on prior note. BP mildly elevated today.  -restart home spironolactone  Concern for obstructive lung disease Patient noted to be on Breo ellipta daily and albuterol inhaler PRN for wheezing. No formal PFTs noted on chart review. No signs of respiratory distress and remains on RA.  -continue home Breo and albuterol   CKD 3A Kidney function is stable and at baseline. Encourage po intake and monitor urine output.   Hypothyroidism Normal TSH. Continue home levothyroxine 50 mcg daily.   Nocturia Continue home oxybutynin 2.5 mg qhs. No acute concerns or changes.   OSA Continue CPAP qhs.  GERD Continue home Protonix 40 mg daily.   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 Joseph Art at [facility]. Chronic and stable at this time. Continue outpatient f/u with Dr. Joseph Art.    Diet:  2 g Na IVF: None,None VTE: Enoxaparin Code: Full PT/OT recs:  SNF , BSC/3in1  Family Update: updated daughter by phone today  Dispo: Anticipated discharge to  SNF vs Home  pending SNF placement.    Rana Snare, DO Internal Medicine Resident PGY-1 Pager: [phone] Please contact the on call pager after 5 pm and on weekends at [phone].

## 2023-01-27 NOTE — Plan of Care (Signed)

## 2023-01-27 NOTE — TOC Initial Note (Signed)
Transition of Care [facility]) - Initial/Assessment Note    Patient Details  Name: Justin Hampton MRN: [id number] Date of Birth: 1946-08-12  [facility] CM/SW Contact:    Kermit Balo, RN Phone Number: 01/27/2023, 2:25 PM  Clinical Narrative:                 Pt is from home alone. He has been driving self and managing his own medications. He does admit to not taking his lactulose as prescribed. CM went over the importance of taking medications as prescribed.  Current recommendations are for SNF. Pt is agreeable. He is just worried about his dog. He gave CM permission to speak to his daughter. CM called the daughter and she is in agreement with SNF and will take care of his dog.  FL2 completed and pt faxed out. Will need choice and insurance approval.  TOC following.   Expected Discharge Plan: Skilled Nursing Facility Barriers to Discharge: Continued Medical Work up   Patient Goals and CMS Choice   CMS Medicare.gov Compare Post Acute Care list provided to:: Patient Choice offered to / list presented to : Patient, Adult Children      Expected Discharge Plan and Services In-house Referral: Clinical Social Work Discharge Planning Services: CM Consult Post Acute Care Choice: Skilled Nursing Facility Living arrangements for the past 2 months: Single Family Home                                      Prior Living Arrangements/Services Living arrangements for the past 2 months: Single Family Home Lives with:: Self Patient language and need for interpreter reviewed:: Yes Do you feel safe going back to the place where you live?: Yes      Need for Family Participation in Patient Care: Yes (Comment) Care giver support system in place?: No (comment) Current home services: DME (cane/ shower seat) Criminal Activity/Legal Involvement Pertinent to Current Situation/Hospitalization: No - Comment as needed  Activities of Daily Living Home Assistive Devices/Equipment: Cane  (specify quad or straight), Walker (specify type) ADL Screening (condition at time of admission) Patient's cognitive ability adequate to safely complete daily activities?: Yes Is the patient deaf or have difficulty hearing?: No Does the patient have difficulty seeing, even when wearing glasses/contacts?: No Does the patient have difficulty concentrating, remembering, or making decisions?: No Patient able to express need for assistance with ADLs?: Yes Does the patient have difficulty dressing or bathing?: No Independently performs ADLs?: Yes (appropriate for developmental age) Does the patient have difficulty walking or climbing stairs?: No Weakness of Legs: None Weakness of Arms/Hands: None  Permission Sought/Granted                  Emotional Assessment Appearance:: Appears stated age Attitude/Demeanor/Rapport: Engaged Affect (typically observed): Accepting Orientation: : Oriented to Self, Oriented to Place, Oriented to  Time, Oriented to Situation   Psych Involvement: No (comment)  Admission diagnosis:  Hepatic encephalopathy [K76.82] Mild dehydration [E86.0] Acute encephalopathy [G93.40] Patient Active Problem List   Diagnosis Date Noted   Acute encephalopathy 01/26/2023   Portal hypertension 01/26/2023   Hypotension 04/01/2021   Lactic acidosis 04/01/2021   Loose stools 04/01/2021   AKI (acute kidney injury) 04/01/2021   Syncope 04/01/2021   Near syncope 03/31/2021   Hypoglycemia 10/31/2018   Impaired functional mobility, balance, gait, and endurance 10/31/2018   Dilation of pancreatic duct 10/02/2018   Hepatitis  B core antibody positive 09/28/2018   Chronic hepatitis C with cirrhosis 09/28/2018   History of encephalopathy 09/28/2018   Vascular dementia without behavioral disturbance    History of hepatitis C    Hypothyroidism (acquired)    Gastroesophageal reflux disease    Aneurysm, cerebral, nonruptured    AMS (altered mental status) 08/17/2018   Gait  disturbance 08/02/2018   Mild memory disturbance 08/02/2018   Hepatic encephalopathy 07/13/2018   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) 07/13/2018   CKD (chronic kidney disease), stage III 07/13/2018   Cervical stenosis of spine 05/29/2018   Degenerative disc disease, cervical 05/29/2018   Iron deficiency anemia due to chronic blood loss 09/12/2017   Rectal bleeding 06/06/2017   Acute renal failure (ARF) 06/06/2017   Hyponatremia 06/06/2017   Hyperkalemia 06/06/2017   Anemia 06/06/2017   GI bleeding 06/06/2017   Hemophilia B    Other dental procedure status 05/22/2017   Family history of CHF (congestive heart failure) 01/20/2017   Family history of sudden death 01/20/2017   Dyspnea 07/08/2016   Other disorder of circulatory system 04/12/2012   Phlebolithiasis 04/12/2012   Abnormal prostate by palpation 04/11/2012   Benign neoplasm of colon 04/11/2012   Degenerative joint disease of pelvic region 04/11/2012   Disorder of prostate 04/11/2012   Iron excess 04/11/2012   Lumbosacral spondylosis without myelopathy 04/11/2012   Nocturia 04/11/2012   Orthostatic hypotension 04/11/2012   Osteoarthritis of both hips 04/11/2012   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   Left sided sciatica 04/11/2012   History of colonic polyps 10/03/2011   PCP:  Verlon AuBoyd, Tammy Lamonica, MD Pharmacy:   [phone] [facility] [place] - [address] [place] [address] Phone: [phone] Fax: [phone]  [id number] [place] [address] [place] [address] Phone: [phone] Fax: [phone]     Social Determinants of Health (SDOH) Social History: SDOH Screenings   Food Insecurity: No Food Insecurity (01/26/2023)  Housing: Low Risk  (01/26/2023)  Transportation Needs: No Transportation Needs (01/26/2023)  Utilities: Not At Risk (01/26/2023)  Tobacco Use: Medium Risk  (01/25/2023)   SDOH Interventions:     Readmission Risk Interventions     No data to display

## 2023-01-27 NOTE — Evaluation (Signed)
Occupational Therapy Evaluation Patient Details Name: Justin RuffingWilliam Hampton MRN: [id number] DOB: 12/11/1945 Today's Date: 01/27/2023   History of Present Illness Pt is a 77 y.o. male who presented 01/25/23 with AMS and several falls over the past few days. MRI negative for acute intracranial abnormality. Admitted with acute encephalopathy. PMH: hemophilia B, vascular dementia, hepatic encephalopathy, cirrhosis due to hepatitis C,  hypothyroidism, OSA, GERD, and cervical DDD   Clinical Impression   Prior to this admission, patient living alone, still driving, and and independent in ADLs. Patient admits he doesn't get into the shower often due to fear of falling. Currently, patient presenting with decreased activity tolerance, decreased cognition, and need for minimal increased assist to complete ADLs. Patient also failing pill box test assessment when provided to patient, and endorses he forgets his medications. Increased time spent with daughter at end of session with regard to strategies to maintaining home independence as well as equipment recommendations. Patient would benefit from continued OT at a less intense interval < 3 hours a day. OT will continue to follow acutely.      Recommendations for follow up therapy are one component of a multi-disciplinary discharge planning process, led by the attending physician.  Recommendations may be updated based on patient status, additional functional criteria and insurance authorization.   Assistance Recommended at Discharge Frequent or constant Supervision/Assistance (Initially)  Patient can return home with the following A little help with walking and/or transfers;A lot of help with bathing/dressing/bathroom;Direct supervision/assist for medications management;Direct supervision/assist for financial management;Assist for transportation;Help with stairs or ramp for entrance;Assistance with cooking/housework    Functional Status Assessment  Patient has had a  recent decline in their functional status and demonstrates the ability to make significant improvements in function in a reasonable and predictable amount of time.  Equipment Recommendations  Other (comment) (Defer to next venue)    Recommendations for Other Services       Precautions / Restrictions Precautions Precautions: Fall Restrictions Weight Bearing Restrictions: No      Mobility Bed Mobility Overal bed mobility: Needs Assistance Bed Mobility: Supine to Sit, Sit to Supine     Supine to sit: Min assist, HOB elevated Sit to supine: Min guard, HOB elevated   General bed mobility comments: Extra time  minA to pull his trunk up to sit edge EOB. Min guard for safety to return to supine    Transfers Overall transfer level: Needs assistance                 General transfer comment: Transfers deferred due to increased focus on cognition      Balance Overall balance assessment: Needs assistance Sitting-balance support: No upper extremity supported, Feet supported, Feet unsupported Sitting balance-Leahy Scale: Fair Sitting balance - Comments: LOB posteriorly when one foot on the floor                                   ADL either performed or assessed with clinical judgement   ADL Overall ADL's : Needs assistance/impaired Eating/Feeding: Set up;Sitting   Grooming: Set up;Sitting   Upper Body Bathing: Min guard;Sitting   Lower Body Bathing: Moderate assistance;Sitting/lateral leans;Sit to/from stand   Upper Body Dressing : Min guard;Sitting   Lower Body Dressing: Moderate assistance;Sitting/lateral leans;Sit to/from stand               Functional mobility during ADLs: Minimal assistance General ADL Comments: Patient presenting with decreased activity  tolerance, decreased cognition, and need for minimal increased assist to complete ADL     Vision Baseline Vision/History: 1 Wears glasses Ability to See in Adequate Light: 0  Adequate Patient Visual Report: No change from baseline       Perception     Praxis      Pertinent Vitals/Pain Pain Assessment Pain Assessment: No/denies pain Faces Pain Scale: No hurt     Hand Dominance     Extremity/Trunk Assessment Upper Extremity Assessment Upper Extremity Assessment: Generalized weakness   Lower Extremity Assessment Lower Extremity Assessment: Defer to PT evaluation   Cervical / Trunk Assessment Cervical / Trunk Assessment: Kyphotic   Communication Communication Communication: No difficulties   Cognition Arousal/Alertness: Awake/alert Behavior During Therapy: WFL for tasks assessed/performed Overall Cognitive Status: Impaired/Different from baseline Area of Impairment: Problem solving                             Problem Solving: Slow processing General Comments: Patient more alert and appropriate to date, however failed pillbox when assessed by OT to date, daughter present for end of evaluation and also noted improvement     General Comments       Exercises     Shoulder Instructions      Home Living Family/patient expects to be discharged to:: Private residence Living Arrangements: Alone Available Help at Discharge: Family;Neighbor;Available PRN/intermittently Type of Home: House Home Access: Stairs to enter [employer] of Steps: 4 Entrance Stairs-Rails: Left (ascending) Home Layout: Two level;Bed/bath upstairs;1/2 bath on main level Alternate Level Stairs-Number of Steps: 23 Alternate Level Stairs-Rails: Can reach both Bathroom Shower/Tub: Producer, television/film/video: Standard     Home Equipment: Cane - single point;Grab bars - toilet;Grab bars - tub/shower;Rolling Walker (2 wheels)          Prior Functioning/Environment Prior Level of Function : Independent/Modified Independent;Driving;History of Falls (last six months)             Mobility Comments: Uses SPC, reports x1 fall in past 6  months ADLs Comments: Independent in dressing, does not shower as often for fear of falling, still drives and manages finances and medication        OT Problem List: Decreased strength;Decreased range of motion;Decreased activity tolerance;Impaired balance (sitting and/or standing);Decreased cognition;Decreased safety awareness;Decreased knowledge of use of DME or AE      OT Treatment/Interventions: Self-care/ADL training;Therapeutic exercise;Energy conservation;DME and/or AE instruction;Cognitive remediation/compensation;Balance training;Patient/family education    OT Goals(Current goals can be found in the care plan section) Acute Rehab OT Goals Patient Stated Goal: to go home OT Goal Formulation: With patient/family Time For Goal Achievement: 02/10/23 Potential to Achieve Goals: Good  OT Frequency: Min 2X/week    Co-evaluation              AM-PAC OT "6 Clicks" Daily Activity     Outcome Measure Help from another person eating meals?: None Help from another person taking care of personal grooming?: None Help from another person toileting, which includes using toliet, bedpan, or urinal?: A Little Help from another person bathing (including washing, rinsing, drying)?: A Little Help from another person to put on and taking off regular upper body clothing?: A Little Help from another person to put on and taking off regular lower body clothing?: A Lot 6 Click Score: 19   End of Session Nurse Communication: Mobility status  Activity Tolerance: Patient tolerated treatment well Patient left: in bed;with call bell/phone  within reach;with bed alarm set;with family/visitor present  OT Visit Diagnosis: Unsteadiness on feet (R26.81);Other abnormalities of gait and mobility (R26.89);Muscle weakness (generalized) (M62.81);Other symptoms and signs involving cognitive function                Time: [id number] OT Time Calculation (min): 41 min Charges:  OT General Charges $OT Visit: 1  Visit OT Evaluation $OT Eval Moderate Complexity: 1 Mod OT Treatments $Therapeutic Activity: 23-37 mins  Pollyann GlenMary Kate E. Dylin Breeden, OTR/L Acute Rehabilitation Services [phone]   Cherlyn CushingMary Kate Alizon Schmeling 01/27/2023, 1:08 PM

## 2023-01-27 NOTE — Progress Notes (Signed)
Physical Therapy Treatment Patient Details Name: Justin RuffingWilliam Hampton MRN: [id number] DOB: 02/17/1946 Today's Date: 01/27/2023   History of Present Illness Pt is a 77 y.o. male who presented 01/25/23 with AMS and several falls over the past few days. MRI negative for acute intracranial abnormality. Admitted with acute encephalopathy. PMH: hemophilia B, vascular dementia, hepatic encephalopathy, cirrhosis due to hepatitis C,  hypothyroidism, OSA, GERD, and cervical DDD    PT Comments    Pt is demonstrating continued cognitive deficits, particularly with his memory, not recalling conversations he had with other staff earlier today. Pt also with poor deficits and safety awareness, initially denying cognitive or balance deficits. However, pt reporting he typically does not use an AD in the home, which contradicts the information he provided yesterday during his PT Eval. Thus, assessed pt's gait without an AD today. Pt displayed increased instability without UE support, needing minA to maintain and recover his balance with x1 LOB when turning his head to look L <> R. This presentation today is not consistent with how he reportedly typically performs functional mobility at baseline. Pt scored 11/24 on the DGI today, suggesting pt is at high risk for falls as a score of </= 19/24 on the DGI is predictive of falls in the elderly. Discussed these concerns with the pt and pt is now agreeable to short-term inpatient rehab, <3 hours/day. Will continue to follow acutely.     Recommendations for follow up therapy are one component of a multi-disciplinary discharge planning process, led by the attending physician.  Recommendations may be updated based on patient status, additional functional criteria and insurance authorization.  Follow Up Recommendations  Can patient physically be transported by private vehicle: Yes    Assistance Recommended at Discharge Frequent or constant Supervision/Assistance  Patient can return  home with the following A little help with bathing/dressing/bathroom;Assistance with cooking/housework;Direct supervision/assist for financial management;Direct supervision/assist for medications management;Assist for transportation;Help with stairs or ramp for entrance;A little help with walking and/or transfers   Equipment Recommendations  BSC/3in1    Recommendations for Other Services       Precautions / Restrictions Precautions Precautions: Fall Restrictions Weight Bearing Restrictions: No     Mobility  Bed Mobility Overal bed mobility: Needs Assistance Bed Mobility: Supine to Sit     Supine to sit: Min assist     General bed mobility comments: Bed flat and rails down, extra time and effort by pt to sit up R EOB, needing minA to prevent posterior LOB once trunk was ~1/2 way up to sit.    Transfers Overall transfer level: Needs assistance Equipment used: Straight cane, None Transfers: Sit to/from Stand Sit to Stand: Min guard           General transfer comment: Pt coming to stand 1x from EOB with SPC, extra time but no LOB, and 1x from recliner without AD, extra time and increased difficulty needing x2 attempts to be successful. Min guard for safety    Ambulation/Gait Ambulation/Gait assistance: Min guard, Min assist Gait Distance (Feet): 190 Feet Assistive device: Straight cane, None Gait Pattern/deviations: Step-through pattern, Decreased stride length, Trunk flexed, Shuffle, Wide base of support Gait velocity: reduced Gait velocity interpretation: <1.31 ft/sec, indicative of household ambulator   General Gait Details: Pt with kyphotic posture, ambulating initially with his SPC, but then pt reporting he does not typically use his SPC in the home. Thus, attempted ambulating without UE support, but pt displaying even slower gait with shuffling, wide steps and intermittent lateral trunk  sway, needing up to minA to maintain his balance. Pt with x1 LOB when turning his  head to look L <> R, needing minA to recover   Stairs             Wheelchair Mobility    Modified Rankin (Stroke Patients Only)       Balance Overall balance assessment: Needs assistance Sitting-balance support: No upper extremity supported, Feet supported, Feet unsupported Sitting balance-Leahy Scale: Fair Sitting balance - Comments: MinA to prevent LOB initially with transition to sit EOB   Standing balance support: During functional activity, Single extremity supported, No upper extremity supported Standing balance-Leahy Scale: Poor Standing balance comment: Reliant on SPC or at least minA if no UE support to ambulate                 Standardized Balance Assessment Standardized Balance Assessment : Dynamic Gait Index (without AD)   Dynamic Gait Index Level Surface: Mild Impairment Change in Gait Speed: Mild Impairment Gait with Horizontal Head Turns: Severe Impairment Gait with Vertical Head Turns: Moderate Impairment Gait and Pivot Turn: Mild Impairment Step Over Obstacle: Mild Impairment Step Around Obstacles: Moderate Impairment Steps: Moderate Impairment Total Score: 11      Cognition Arousal/Alertness: Awake/alert Behavior During Therapy: WFL for tasks assessed/performed Overall Cognitive Status: Impaired/Different from baseline Area of Impairment: Problem solving, Memory, Safety/judgement                     Memory: Decreased short-term memory   Safety/Judgement: Decreased awareness of deficits, Decreased awareness of safety   Problem Solving: Slow processing, Requires verbal cues General Comments: Pt slow to process and respond intermittently. Pt not recalling conversation he had with TOC and MDs earlier this morning in regards to him being agreeable to SNF for rehab, now stating he had a conversation yesterday with his daughter where they agreed the plan was to return home. Pt in denial of his memory and other cognitive deficits and of  his balance being worse than his baseline placing him at risk for injury at home. After discussing this, pt then agreeable for SNF for rehab again        Exercises      General Comments General comments (skin integrity, edema, etc.): educated pt further on his cognitive and balance deficits and risk for injury at home alone with pt now agreeable to short-term rehab at a SNF prior to return home      Pertinent Vitals/Pain Pain Assessment Pain Assessment: Faces Faces Pain Scale: No hurt Pain Intervention(s): Monitored during session    Home Living Family/patient expects to be discharged to:: Private residence Living Arrangements: Alone Available Help at Discharge: Family;Neighbor;Available PRN/intermittently Type of Home: House Home Access: Stairs to enter Entrance Stairs-Rails: Left (ascending) Entrance Stairs-Number of Steps: 4 Alternate Level Stairs-Number of Steps: 23 Home Layout: Two level;Bed/bath upstairs;1/2 bath on main level Home Equipment: Cane - single point;Grab bars - toilet;Grab bars - tub/shower;Rolling Walker (2 wheels)      Prior Function            PT Goals (current goals can now be found in the care plan section) Acute Rehab PT Goals Patient Stated Goal: to go home PT Goal Formulation: With patient Time For Goal Achievement: 02/09/23 Potential to Achieve Goals: Good Progress towards PT goals: Progressing toward goals    Frequency    Min 3X/week      PT Plan Discharge plan needs to be updated    Co-evaluation  AM-PAC PT "6 Clicks" Mobility   Outcome Measure  Help needed turning from your back to your side while in a flat bed without using bedrails?: A Little Help needed moving from lying on your back to sitting on the side of a flat bed without using bedrails?: A Little Help needed moving to and from a bed to a chair (including a wheelchair)?: A Little Help needed standing up from a chair using your arms (e.g., wheelchair  or bedside chair)?: A Little Help needed to walk in hospital room?: A Little Help needed climbing 3-5 steps with a railing? : A Little 6 Click Score: 18    End of Session Equipment Utilized During Treatment: Gait belt Activity Tolerance: Patient tolerated treatment well Patient left: with call bell/phone within reach;in chair;with chair alarm set   PT Visit Diagnosis: Unsteadiness on feet (R26.81);Other abnormalities of gait and mobility (R26.89);Muscle weakness (generalized) (M62.81);History of falling (Z91.81)     Time: [id number] PT Time Calculation (min) (ACUTE ONLY): 21 min  Charges:  $Gait Training: 8-22 mins                     Raymond GurneyAnessa Pettis, PT, DPT Acute Rehabilitation Services  Office: [phone]    Jewel Baizenessa M Pettis 01/27/2023, 1:20 PM

## 2023-01-27 NOTE — [place] FL2 (Signed)
MEDICAID FL2 LEVEL OF CARE FORM     IDENTIFICATION  Patient Name: Justin Hampton Birthdate: 02/22/1946 Sex: male Admission Date (Current Location): 01/25/2023  [facility] [place]   Facility and Address:  [facility] [place], [address], [place], [place] [address]      Provider Number: [id number]  Attending Physician Name and Address:  Ginnie SmartHatcher, Jeffrey C, MD  Relative Name and Phone Number:       Current Level of Care: Hospital Recommended Level of Care: Skilled Nursing Facility Prior Approval Number:    Date Approved/Denied:   PASRR Number: [id number]  Discharge Plan: SNF    Current Diagnoses: Patient Active Problem List   Diagnosis Date Noted   Acute encephalopathy 01/26/2023   Portal hypertension 01/26/2023   Hypotension 04/01/2021   Lactic acidosis 04/01/2021   Loose stools 04/01/2021   AKI (acute kidney injury) 04/01/2021   Syncope 04/01/2021   Near syncope 03/31/2021   Hypoglycemia 10/31/2018   Impaired functional mobility, balance, gait, and endurance 10/31/2018   Dilation of pancreatic duct 10/02/2018   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   Chronic hepatitis C with cirrhosis 09/28/2018   History of encephalopathy 09/28/2018   Vascular dementia without behavioral disturbance    History of hepatitis C    Hypothyroidism (acquired)    Gastroesophageal reflux disease    Aneurysm, cerebral, nonruptured    AMS (altered mental status) 08/17/2018   Gait disturbance 08/02/2018   Mild memory disturbance 08/02/2018   Hepatic encephalopathy 07/13/2018   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) 07/13/2018   CKD (chronic kidney disease), stage III 07/13/2018   Cervical stenosis of spine 05/29/2018   Degenerative disc disease, cervical 05/29/2018   Iron deficiency anemia due to chronic blood loss 09/12/2017   Rectal bleeding 06/06/2017   Acute renal failure (ARF) 06/06/2017   Hyponatremia 06/06/2017    Hyperkalemia 06/06/2017   Anemia 06/06/2017   GI bleeding 06/06/2017   Hemophilia B    Other dental procedure status 05/22/2017   Family history of CHF (congestive heart failure) 01/20/2017   Family history of sudden death 01/20/2017   Dyspnea 07/08/2016   Other disorder of circulatory system 04/12/2012   Phlebolithiasis 04/12/2012   Abnormal prostate by palpation 04/11/2012   Benign neoplasm of colon 04/11/2012   Degenerative joint disease of pelvic region 04/11/2012   Disorder of prostate 04/11/2012   Iron excess 04/11/2012   Lumbosacral spondylosis without myelopathy 04/11/2012   Nocturia 04/11/2012   Orthostatic hypotension 04/11/2012   Osteoarthritis of both hips 04/11/2012   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   Left sided sciatica 04/11/2012   History of colonic polyps 10/03/2011    Orientation RESPIRATION BLADDER Height & Weight     Self, Time, Situation, Place  Normal Continent Weight: 194 lb 3.6 oz (88.1 kg) Height:  5\' 10"  (177.8 cm)  BEHAVIORAL SYMPTOMS/MOOD NEUROLOGICAL BOWEL NUTRITION STATUS      Continent Diet (2 gram sodium)  AMBULATORY STATUS COMMUNICATION OF NEEDS Skin   Limited Assist Verbally Normal                       Personal Care Assistance Level of Assistance  Bathing, Feeding, Dressing Bathing Assistance: Limited assistance Feeding assistance: Independent Dressing Assistance: Limited assistance     Functional Limitations Info             SPECIAL CARE FACTORS FREQUENCY  PT (By licensed PT), OT (By licensed  OT)     PT Frequency: 5x/wk OT Frequency: 5x/wk            Contractures Contractures Info: Not present    Additional Factors Info  Code Status, Allergies Code Status Info: Full Allergies Info: Morphine, Zolpidem           Current Medications (01/27/2023):  This is the current hospital active medication list Current Facility-Administered Medications  Medication Dose Route Frequency Provider Last Rate Last  Admin   acetaminophen (TYLENOL) tablet 650 mg  650 mg Oral Q6H PRN Merrilyn Puma, MD   650 mg at 01/27/23 0321   Or   acetaminophen (TYLENOL) suppository 650 mg  650 mg Rectal Q6H PRN Merrilyn Puma, MD       albuterol (PROVENTIL) (2.5 MG/3ML) 0.083% nebulizer solution 2.5 mg  2.5 mg Inhalation Q6H PRN Merrilyn Puma, MD       Chlorhexidine Gluconate Cloth 2 % PADS 6 each  6 each Topical Daily Ginnie Smart, MD   6 each at 01/26/23 1633   enoxaparin (LOVENOX) injection 40 mg  40 mg Subcutaneous Daily Merrilyn Puma, MD   40 mg at 01/27/23 0906   fluticasone furoate-vilanterol (BREO ELLIPTA) 100-25 MCG/ACT 1 puff  1 puff Inhalation Daily Rana Snare, DO   1 puff at 01/27/23 0911   lactulose (CHRONULAC) 10 GM/15ML solution 30 g  30 g Oral TID Rana Snare, DO       levothyroxine (SYNTHROID) tablet 50 mcg  50 mcg Oral Daily Merrilyn Puma, MD   50 mcg at 01/27/23 0548   oxybutynin (DITROPAN) tablet 5 mg  5 mg Oral QHS Rana Snare, DO       pantoprazole (PROTONIX) EC tablet 40 mg  40 mg Oral Daily Merrilyn Puma, MD   40 mg at 01/27/23 2248   rifaximin (XIFAXAN) tablet 550 mg  550 mg Oral BID Merrilyn Puma, MD   550 mg at 01/27/23 2500   spironolactone (ALDACTONE) tablet 25 mg  25 mg Oral Daily Rana Snare, DO   25 mg at 01/27/23 3704     Discharge Medications: Please see discharge summary for a list of discharge medications.  Relevant Imaging Results:  Relevant Lab Results:   Additional Information SS#: [id number]  Baldemar Lenis, LCSW

## 2023-01-28 DIAGNOSIS — K7460 Unspecified cirrhosis of liver: Secondary | ICD-10-CM | POA: Diagnosis not present

## 2023-01-28 DIAGNOSIS — B1920 Unspecified viral hepatitis C without hepatic coma: Secondary | ICD-10-CM

## 2023-01-28 DIAGNOSIS — G9340 Encephalopathy, unspecified: Secondary | ICD-10-CM

## 2023-01-28 DIAGNOSIS — D696 Thrombocytopenia, unspecified: Secondary | ICD-10-CM

## 2023-01-28 LAB — BASIC METABOLIC PANEL
Anion gap: 10 (ref 5–15)
BUN: 19 mg/dL (ref 8–23)
CO2: 24 mmol/L (ref 22–32)
Calcium: 8.8 mg/dL — ABNORMAL LOW (ref 8.9–10.3)
Chloride: 103 mmol/L (ref 98–111)
Creatinine, Ser: 0.94 mg/dL (ref 0.61–1.24)
GFR, Estimated: >60 mL/min (ref >60)
Glucose, Bld: 91 mg/dL (ref 70–99)
Potassium: 3.6 mmol/L (ref 3.5–5.1)
Sodium: 137 mmol/L (ref 135–145)

## 2023-01-28 LAB — CBC
HCT: 35.1 % — ABNORMAL LOW (ref 39.0–52.0)
Hemoglobin: 12 g/dL — ABNORMAL LOW (ref 13.0–17.0)
MCH: 35.4 pg — ABNORMAL HIGH (ref 26.0–34.0)
MCHC: 34.2 g/dL (ref 30.0–36.0)
MCV: 103.5 fL — ABNORMAL HIGH (ref 80.0–100.0)
Platelets: 124 10*3/uL — ABNORMAL LOW (ref 150–400)
RBC: 3.39 MIL/uL — ABNORMAL LOW (ref 4.22–5.81)
RDW: 12.2 % (ref 11.5–15.5)
WBC: 6.8 10*3/uL (ref 4.0–10.5)
nRBC: 0 % (ref 0.0–0.2)

## 2023-01-28 LAB — FOLATE: Folate: 14.5 ng/mL (ref >5.9)

## 2023-01-28 LAB — VITAMIN B12: Vitamin B-12: 651 pg/mL (ref 180–914)

## 2023-01-28 LAB — GLUCOSE, CAPILLARY
Glucose-Capillary: 86 mg/dL (ref 70–99)
Glucose-Capillary: 75 mg/dL (ref 70–99)

## 2023-01-28 NOTE — Plan of Care (Signed)
  Problem: Education: Goal: Knowledge of General Education information will improve Description: Including pain rating scale, medication(s)/side effects and non-pharmacologic comfort measures Outcome: Progressing   Problem: Clinical Measurements: Goal: Respiratory complications will improve Outcome: Progressing   Problem: Activity: Goal: Risk for activity intolerance will decrease Outcome: Progressing   Problem: Nutrition: Goal: Adequate nutrition will be maintained Outcome: Progressing   Problem: Skin Integrity: Goal: Risk for impaired skin integrity will decrease Outcome: Progressing   

## 2023-01-28 NOTE — Progress Notes (Signed)
HD#2 Subjective:  Overnight Events: none  Patient seen at bedside this morning. He had small bowel movement this morning with blood in pants. This has not happened in the past. No other episodes of bleeding. He did not have a bowel movement yesterday. He has made arrangements for his dog to have care while he is in rehab.  Pt is updated on the plan for today, and all questions and concerns are addressed.   Objective:  Vital signs in last 24 hours: Vitals:   01/27/23 1239 01/27/23 1646 01/27/23 2342 01/28/23 0342  BP: (!) 143/78 123/78 124/85 114/70  Pulse: 65 73 73 60  Resp: 15 15 18 17   Temp: 97.9 F (36.6 C) 97.6 F (36.4 C) 97.8 F (36.6 C) 97.6 F (36.4 C)  TempSrc: Oral Oral Oral Oral  SpO2: 98% 100% 97% 98%  Weight:      Height:       Supplemental O2: Room Air SpO2: 98 %   Physical Exam:  Constitutional: in no acute distress, alert and oriented x3 Cardiovascular: regular rate and rhythm, no m/r/g Pulmonary/Chest: normal work of breathing on room air, lungs clear to auscultation bilaterally Abdominal: soft, non-tender, non-distended GI: no external or internal hemorrhoids noted on exam, chaperone in room for rectal exam MSK: no swelling to lower extremities bilaterally  Filed Weights   01/25/23 2041 01/27/23 0500  Weight: 93 kg 88.1 kg     Intake/Output Summary (Last 24 hours) at 01/28/2023 0636 Last data filed at 01/28/2023 0558 Gross per 24 hour  Intake --  Output 700 ml  Net -700 ml   Net IO Since Admission: -400 mL [01/28/23 0636]  Pertinent Labs:  WBC 6.8 with Hgb of 12, MCV 103 Baseline hgb 10-12 Platelets stable at 124  Imaging: No results found.  Assessment/Plan:   Principal Problem:   Acute encephalopathy Active Problems:   Hemophilia B   Hepatic encephalopathy   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)   CKD (chronic kidney disease), stage III   Hypothyroidism (acquired)   Gastroesophageal reflux disease   Degenerative disc  disease, cervical   Nocturia   Portal hypertension   Patient Summary: Justin Hampton is a 77 y.o. with a pertinent PMH of cirrhosis 2/2 to hepatitis C, who presented with hepatic encephalopathy on HD#2.   Cirrhosis 2/2 to hepatitis C Hepatic encephalopathy- resolved Thrombocytopenia He presented with hepatic encephalopathy and now is at baseline.  Not have any bowel movements yesterday and a very small 1 this morning.  Loose stools increased to help dose of 30 g 3 times daily/5.  Will continue that today with goal of 2-3 bowel movements daily.  -Continue home lactulose 30 g 3 times daily -Continue home rifaximin 550 mg twice daily -trend cbc -delirium precautions -fall precautions  Blood per rectum He had one episode of some red blood in pants when he went to bathroom this morning. His hgb this morning was at baseline of 12.  Differentials include hemorrhoid versus possible rectal varices or diverticulosis. On rectal exam I did not appreciate external or internal hemorrhoids. 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 CT abd in 2022 did not show signs of diverticulosis. Will monitor for further episodes which could raise suspicion for variceal bleed. -continue to monitor -repeat CBC in AM  Macrocytic anemia Hgb stable at baseline of 10-12 with MCV at 103.  -  folate, B12 in AM  Diet:  2g NA VTE: Enoxaparin Code: Full PT/OT recs: SNF for Subacute PT, rollator.  Dispo: Anticipated discharge to Skilled nursing facility pending repeat CBC in AM and SNF placement.   Yarithza Mink M. Rosann Gorum, D.O.  Internal Medicine Resident, PGY-2 [facility]  Pager: [phone] 6:36 AM, 01/28/2023   **Please contact the on call pager after 5 pm and on weekends at [phone].**

## 2023-01-29 LAB — BASIC METABOLIC PANEL
Anion gap: 7 (ref 5–15)
BUN: 20 mg/dL (ref 8–23)
CO2: 22 mmol/L (ref 22–32)
Calcium: 8.8 mg/dL — ABNORMAL LOW (ref 8.9–10.3)
Chloride: 108 mmol/L (ref 98–111)
Creatinine, Ser: 0.99 mg/dL (ref 0.61–1.24)
GFR, Estimated: >60 mL/min (ref >60)
Glucose, Bld: 74 mg/dL (ref 70–99)
Potassium: 3.7 mmol/L (ref 3.5–5.1)
Sodium: 137 mmol/L (ref 135–145)

## 2023-01-29 LAB — CBC
HCT: 39.5 % (ref 39.0–52.0)
Hemoglobin: 13.1 g/dL (ref 13.0–17.0)
MCH: 35.1 pg — ABNORMAL HIGH (ref 26.0–34.0)
MCHC: 33.2 g/dL (ref 30.0–36.0)
MCV: 105.9 fL — ABNORMAL HIGH (ref 80.0–100.0)
Platelets: 139 10*3/uL — ABNORMAL LOW (ref 150–400)
RBC: 3.73 MIL/uL — ABNORMAL LOW (ref 4.22–5.81)
RDW: 12.6 % (ref 11.5–15.5)
WBC: 8 10*3/uL (ref 4.0–10.5)
nRBC: 0 % (ref 0.0–0.2)

## 2023-01-29 LAB — GLUCOSE, CAPILLARY: Glucose-Capillary: 101 mg/dL — ABNORMAL HIGH (ref 70–99)

## 2023-01-29 NOTE — Discharge Instructions (Addendum)
You were hospitalized for confusion and weakness likely from decreasing your lactulose. We restarted your home lactulose and rifaximin doses and your confusion resolved and mentation improved. Please make sure to take your lactulose three times a day and rifaximin twice a day. You still have some weakness which would benefit from rehabilitation to build back your strength and address your balance. Thank you for allowing Justin Hampton to be part of your care.   Please continue seeing your primary care doctor, Verlon Au, MD, and schedule follow up visit regarding recent hospitalization.   Please see below for medication changes: **

## 2023-01-29 NOTE — Plan of Care (Signed)
  Problem: Education: Goal: Knowledge of General Education information will improve Description: Including pain rating scale, medication(s)/side effects and non-pharmacologic comfort measures Outcome: Progressing   Problem: Health Behavior/Discharge Planning: Goal: Ability to manage health-related needs will improve Outcome: Progressing   Problem: Clinical Measurements: Goal: Cardiovascular complication will be avoided Outcome: Progressing   Problem: Activity: Goal: Risk for activity intolerance will decrease Outcome: Progressing   Problem: Nutrition: Goal: Adequate nutrition will be maintained Outcome: Progressing   Problem: Safety: Goal: Ability to remain free from injury will improve Outcome: Progressing   

## 2023-01-29 NOTE — Progress Notes (Signed)
Pt refused to wear c-pap.

## 2023-01-29 NOTE — Progress Notes (Addendum)
HD#3 Subjective:   Summary: Justin Hampton is a 77 y.o. 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  Overnight Events: none  Patient feeling well. No new concerns. Having bowel movements more regularly now, last one earlier this morning. No further bleeding per rectum noted except that episode yesterday.   Objective:  Vital signs in last 24 hours: Vitals:   01/29/23 0347 01/29/23 0500 01/29/23 0733 01/29/23 0746  BP: 98/74   109/78  Pulse: 72   83  Resp: 20   17  Temp: 98 F (36.7 C)   98.3 F (36.8 C)  TempSrc: Oral   Oral  SpO2: 91%  98% 100%  Weight:  88.3 kg    Height:       Supplemental O2: Room Air SpO2: 100 %   Physical Exam:  Constitutional: awake and alert, sitting in chair comfortably, in no acute distress HENT: normocephalic atraumatic Cardiovascular: regular rate and rhythm Pulmonary/Chest: normal work of breathing on room air Abdominal: bowel sounds present, soft, non-tender, non-distended MSK: normal bulk and tone Neurological: alert and oriented x 3 Skin: warm and dry Psych: pleasant mood  Filed Weights   01/25/23 2041 01/27/23 0500 01/29/23 0500  Weight: 93 kg 88.1 kg 88.3 kg     Intake/Output Summary (Last 24 hours) at 01/29/2023 1032 Last data filed at 01/29/2023 4098 Gross per 24 hour  Intake 790 ml  Output 300 ml  Net 490 ml   Net IO Since Admission: 330 mL [01/29/23 1032]  Pertinent Labs:    Latest Ref Rng & Units 01/28/2023    4:14 AM 01/27/2023    4:56 AM 01/26/2023    6:12 AM  CBC  WBC 4.0 - 10.5 K/uL 6.8  5.6    Hemoglobin 13.0 - 17.0 g/dL 11.9  14.7  82.9   Hematocrit 39.0 - 52.0 % 35.1  32.3  32.0   Platelets 150 - 400 K/uL 124  120         Latest Ref Rng & Units 01/28/2023    4:14 AM 01/27/2023    4:56 AM 01/26/2023    6:12 AM  CMP  Glucose 70 - 99 mg/dL 91  96     BUN 8 - 23 mg/dL 19  25    Creatinine 5.62 - 1.24 mg/dL 1.30  8.65    Sodium 784 - 145 mmol/L 137  138  142   Potassium 3.5 - 5.1 mmol/L 3.6  3.7  3.7   Chloride 98 - 111 mmol/L 103  111    CO2 22 - 32 mmol/L 24  21    Calcium 8.9 - 10.3 mg/dL 8.8  8.7    Total Protein 6.5 - 8.1 g/dL  5.4    Total Bilirubin 0.3 - 1.2 mg/dL  1.5    Alkaline Phos 38 - 126 U/L  52    AST 15 - 41 U/L  48    ALT 0 - 44 U/L  24      Imaging: No results found.  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 Having bowel movements more regularly now with his home regimen. Thrombocytopenia remains stable. We are awaiting SNF process which patient has been agreeable to.  -continue home lactulose 30 g TID -continue home rifaximin 550 mg BID -trend CBC -delirium precautions -fall precautions  Blood per rectum Had one episode of red blood in pants yesterday morning. Rectal exam unremarkable for hemorrhoids yesterday. Patient denies any further bleeding yesterday and today after bowel movements. Hgb remains stable today. Will monitor for further episodes.   Macrocytic anemia Hgb stable at baseline. Normal folate and B12 levels. No further bleeding noted. Macrocytic anemia likely in setting of his cirrhosis.    HTN Normotensive on his home spironolactone 25 mg daily.   Concern for obstructive lung disease Patient noted to be on Breo ellipta daily and albuterol inhaler PRN for wheezing per PCP notes. No formal PFTs noted on chart review. No acute changes and he is stable on room air.  -continue home Breo and albuterol   CKD 3A Renal function stable at baseline.   Hypothyroidism Normal TSH. Continue home levothyroxine 50 mcg daily.   Nocturia No acute concerns or changes. Continue home oxybutynin 2.5 mg qhs.    GERD Continue home Protonix 40 mg daily.   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 Joseph Art at [facility]. Chronic and stable at this time. Continue outpatient f/u with Dr. Joseph Art.    Diet:  2 g Na IVF: None,None VTE: Enoxaparin Code: Full PT/OT recs:  SNF , BSC/3in1 Family Update: updated daughter over phone on 4/5  Dispo: Anticipated discharge to  SNF  pending SNF placement.    Rana Snare, DO Internal Medicine Resident PGY-1 Pager: [phone] Please contact the on call pager after 5 pm and on weekends at [phone].

## 2023-01-30 DIAGNOSIS — D696 Thrombocytopenia, unspecified: Secondary | ICD-10-CM | POA: Diagnosis not present

## 2023-01-30 DIAGNOSIS — K7460 Unspecified cirrhosis of liver: Secondary | ICD-10-CM | POA: Diagnosis not present

## 2023-01-30 DIAGNOSIS — B1920 Unspecified viral hepatitis C without hepatic coma: Secondary | ICD-10-CM | POA: Diagnosis not present

## 2023-01-30 MED ORDER — ORAL CARE MOUTH RINSE: 15.0000 mL | OROMUCOSAL | Status: DC | PRN

## 2023-01-30 NOTE — Plan of Care (Signed)
  Problem: Clinical Measurements: Goal: Respiratory complications will improve Outcome: Progressing Goal: Cardiovascular complication will be avoided Outcome: Progressing   Problem: Nutrition: Goal: Adequate nutrition will be maintained Outcome: Progressing   Problem: Coping: Goal: Level of anxiety will decrease Outcome: Progressing   Problem: Pain Managment: Goal: General experience of comfort will improve Outcome: Progressing   Problem: Clinical Measurements: Goal: Respiratory complications will improve Outcome: Progressing Goal: Cardiovascular complication will be avoided Outcome: Progressing   Problem: Nutrition: Goal: Adequate nutrition will be maintained Outcome: Progressing   Problem: Coping: Goal: Level of anxiety will decrease Outcome: Progressing   Problem: Pain Managment: Goal: General experience of comfort will improve Outcome: Progressing

## 2023-01-30 NOTE — Progress Notes (Signed)
Physical Therapy Treatment Patient Details Name: Justin Hampton MRN: [id number] DOB: 02-06-1946 Today's Date: 01/30/2023   History of Present Illness Pt is a 77 y.o. male who presented 01/25/23 with AMS and several falls over the past few days. MRI negative for acute intracranial abnormality. Admitted with acute encephalopathy. PMH: hemophilia B, vascular dementia, hepatic encephalopathy, cirrhosis due to hepatitis C,  hypothyroidism, OSA, GERD, and cervical DDD    PT Comments    Pt pleasant and agreeable to participate in physical therapy session. Requiring moderate assist for LB ADL's sitting edge of bed. Pt ambulating limited hallway distances with a cane at a min assist level. Gait speed of 0.98 ft/s indicative that pt is at high risk for falls. Additionally, he also demonstrates slowed gait speed with dual tasking, with reminders to continue walking. Would benefit from post acute rehab to address balance, cognition, strength, posture, and endurance.     Recommendations for follow up therapy are one component of a multi-disciplinary discharge planning process, led by the attending physician.  Recommendations may be updated based on patient status, additional functional criteria and insurance authorization.  Follow Up Recommendations  Can patient physically be transported by private vehicle: Yes    Assistance Recommended at Discharge Frequent or constant Supervision/Assistance  Patient can return home with the following A little help with bathing/dressing/bathroom;Assistance with cooking/housework;Direct supervision/assist for financial management;Direct supervision/assist for medications management;Assist for transportation;Help with stairs or ramp for entrance;A little help with walking and/or transfers   Equipment Recommendations  BSC/3in1    Recommendations for Other Services       Precautions / Restrictions Precautions Precautions: Fall Restrictions Weight Bearing Restrictions: No      Mobility  Bed Mobility Overal bed mobility: Needs Assistance Bed Mobility: Supine to Sit, Sit to Supine     Supine to sit: Mod assist Sit to supine: Min guard   General bed mobility comments: ModA to assist trunk up to sitting position, min guard for return to bed    Transfers Overall transfer level: Needs assistance Equipment used: Straight cane Transfers: Sit to/from Stand Sit to Stand: Min guard           General transfer comment: Min guard for safety to stand from edge of bed and toilet    Ambulation/Gait Ambulation/Gait assistance: Min assist Gait Distance (Feet): 200 Feet Assistive device: Straight cane Gait Pattern/deviations: Step-through pattern, Decreased stride length, Trunk flexed, Shuffle, Wide base of support Gait velocity: 0.98 ft/s Gait velocity interpretation: <1.31 ft/sec, indicative of household ambulator   General Gait Details: Pt with kyphotic posture, up to minA provided for dynamic balance, slowed gait speed with dual tasking   Stairs             Wheelchair Mobility    Modified Rankin (Stroke Patients Only)       Balance Overall balance assessment: Needs assistance Sitting-balance support: No upper extremity supported, Feet supported, Feet unsupported Sitting balance-Leahy Scale: Fair     Standing balance support: During functional activity, Single extremity supported, No upper extremity supported Standing balance-Leahy Scale: Poor Standing balance comment: Reliant on [facility]                            Cognition Arousal/Alertness: Awake/alert Behavior During Therapy: WFL for tasks assessed/performed Overall Cognitive Status: Impaired/Different from baseline Area of Impairment: Problem solving, Memory, Safety/judgement  Memory: Decreased short-term memory   Safety/Judgement: Decreased awareness of deficits, Decreased awareness of safety   Problem Solving: Slow processing, Requires  verbal cues          Exercises      General Comments        Pertinent Vitals/Pain Pain Assessment Pain Assessment: Faces Faces Pain Scale: No hurt    Home Living                          Prior Function            PT Goals (current goals can now be found in the care plan section) Acute Rehab PT Goals Patient Stated Goal: to go home PT Goal Formulation: With patient Time For Goal Achievement: 02/09/23 Potential to Achieve Goals: Good Progress towards PT goals: Progressing toward goals    Frequency    Min 2X/week      PT Plan Frequency needs to be updated    Co-evaluation              AM-PAC PT "6 Clicks" Mobility   Outcome Measure  Help needed turning from your back to your side while in a flat bed without using bedrails?: A Little Help needed moving from lying on your back to sitting on the side of a flat bed without using bedrails?: A Little Help needed moving to and from a bed to a chair (including a wheelchair)?: A Little Help needed standing up from a chair using your arms (e.g., wheelchair or bedside chair)?: A Little Help needed to walk in hospital room?: A Little Help needed climbing 3-5 steps with a railing? : A Little 6 Click Score: 18    End of Session Equipment Utilized During Treatment: Gait belt Activity Tolerance: Patient tolerated treatment well Patient left: with call bell/phone within reach;in chair;with chair alarm set   PT Visit Diagnosis: Unsteadiness on feet (R26.81);Other abnormalities of gait and mobility (R26.89);Muscle weakness (generalized) (M62.81);History of falling (Z91.81)     Time: 1141-1200 PT Time Calculation (min) (ACUTE ONLY): 19 min  Charges:  $Therapeutic Activity: 8-22 mins                     Lillia Pauls, PT, DPT Acute Rehabilitation Services Office [phone]    Justin Hampton 01/30/2023, 12:58 PM

## 2023-01-30 NOTE — TOC Progression Note (Signed)
Transition of Care [facility]) - Progression Note    Patient Details  Name: Justin Hampton MRN: [id number] Date of Birth: 03/15/1946  [facility] CM/SW Contact  Kermit Balo, RN Phone Number: 01/30/2023, 11:41 AM  Clinical Narrative:    CM provided him the offers for SNF rehab. Pt [facility]. CM has confirmed they will have a bed for him. CM has asked TOC MOA to begin insurance auth.  CM updated pts daughter, with his permission.   Expected Discharge Plan: Skilled Nursing Facility Barriers to Discharge: Continued Medical Work up  Expected Discharge Plan and Services In-house Referral: Clinical Social Work Discharge Planning Services: CM Consult Post Acute Care Choice: Skilled Nursing Facility Living arrangements for the past 2 months: Single Family Home                                       Social Determinants of Health (SDOH) Interventions SDOH Screenings   Food Insecurity: No Food Insecurity (01/26/2023)  Housing: Low Risk  (01/26/2023)  Transportation Needs: No Transportation Needs (01/26/2023)  Utilities: Not At Risk (01/26/2023)  Tobacco Use: Medium Risk (01/25/2023)    Readmission Risk Interventions     No data to display

## 2023-01-30 NOTE — Progress Notes (Signed)
Subjective:  Justin Hampton was feeling good this morning, able to describe the events leading to his hospitalization in detail. Denies any confusion and feels that he is back to baseline cognition. Reports frequent bowel movements now that he is taking home medications again.    Objective: Vitals over previous 24hr: Vitals:   01/29/23 2334 01/30/23 0335 01/30/23 0500 01/30/23 0725  BP: 99/63 110/76  114/69  Pulse: 81 75  62  Resp: 14 14  16   Temp: 98.4 F (36.9 C) 97.6 F (36.4 C)  98.2 F (36.8 C)  TempSrc:  Oral  Oral  SpO2: 98% 99%  100%  Weight:   90.8 kg   Height:        General:      awake and alert, lying comfortably in bed, cooperative, not in acute distress Skin:       warm and dry, intact without any obvious lesions or scars, no rashes Lungs:      normal respiratory effort, breathing unlabored, symmetrical chest rise, no crackles or wheezing Cardiac:      regular rate and rhythm, normal S1 and S2, no pitting edema Abdomen:      soft and non-distended, normoactive bowel sounds present in all four quadrants, no tenderness to palpation or guarding Neurologic:      oriented to person-place-time, moving all extremities, mild postural tremor, no asterixis Psychiatric:      euthymic mood with congruent affect, intelligible speech    Assessment/Plan: Justin Hampton is a 77 year old male with a past medical history of hemophilia B, obstructive sleep apnea, hypothyroidism, cervical degenerative disc disease, and cirrhosis secondary to hepatitis C who presented with weakness and confusion, now admitted for hepatic encephalopathy.    ---Cirrhosis secondary to hepatitis C ---Hepatic encephalopathy, now resolved ---Thrombocytopenia  Patient has history of cirrhosis secondary to hepatitis C complicated by hepatic encephalopathy. Previously treated with interferon and ribavirin in 2000, last viral load undetectable per patient. Presented with confusion after attempting to wean  himself off his home lactulose. Upon arrival, laboratory testing demonstrated Plat 145 around his baseline. Mental status improved after resuming home medications of lactulose and rifaximin. Bowel movements have also been more regular and thrombocytopenia has remained stable.   > Lactulose 30g q8  > Rifaximin 550mg  q12  > Spironolactone 25mg  q24  > Discharge to [facility] SNF, pending insurance authorization    ---Bright red blood per rectum Patient reports one episode of red-tinted stool on 4-6, vital signs and hemoglobin have remained stable throughout hospitalization. Rectal examination negative for external or internal hemorrhoids. 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 Abdominal CT in 2022 negative for findings concerning for diverticulosis. Since resuming home lactulose, patient has been producing bowel movements and presumably wiping more frequently. Differential includes possible rectal varix, internal hemorrhoids, or anal fissure secondary to wiping. Since that initial episode, patient denies any further bleeding or red-colored stool.   > Trend CBC q48  > Monitor clinically   ---Macrocytic anemia Upon arrival, laboratory testing demonstrated hemoglobin within baseline of 10-12. Both vitamin B9 and B12 are within normal range. Etiology likely longstanding cirrhosis. Patient reported one isolated episode of red-tinted stool on 4-6 without recurrence, hemoglobin has remained stable throughout current hospitalization.  > Trend CBC q48   ---Hypertension Patient has history of hypertension managed with spironolactone. Blood pressure has remained stable within normal range throughout current hospitalization.  > Spironolactone 25mg  q24   ---Concern for obstructive lung disease Patient reports  taking fluticasone-vilanterol daily and albuterol as needed for wheezing. Electronic medical record negative for PFT results. Vitals and  breathing have been stable throughout current hospitalization without supplemental oxygen.    > Fluticasone-vilanterol 100-25ug q24  > Albuterol 2.5mg  q6 PRN  > Consider PFT evaluation as outpatient   ---Chronic kidney disease IIIa Patient has history of chronic kidney disease with baseline creatinine around 0.7-1.2. Upon arrival, creatinine elevated to 1.36 but has since improved and remained stable within baseline range.  > Trend BMP q48   ---Hypothyroidism Patient has history of hypothyroidism managed with levothyroxine. Upon arrival, TSH was within normal limits.   > Levothyroxine 50ug q24   ---Nocturia Patient has history of nocturia managed with oxybutynin.  > Oxybutynin 5mg  q24   ---Gastroesophageal reflux disease Patient has history of gastroesophageal reflux disease managed with pantoprazole.  > Pantoprazole 40mg  q24   ---Hemophilia B complicated by cervical degenerative disc disease Patient has history of hemophilia B, which has led to cervical degenerative disc disease from hemophilic arthropathy. Currently established and follows with hematologist at [facility].  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    Lajuana Ripple, MD Internal Medicine PGY-1 Pager [phone]  After 5pm on weekdays and 1pm on weekends: On Call pager [phone]

## 2023-01-30 NOTE — Care Management Important Message (Signed)
Important Message  Patient Details  Name: Justin Hampton MRN: [id number] Date of Birth: December 27, 1945   Medicare Important Message Given:  Yes     Sherilyn Banker 01/30/2023, 3:32 PM

## 2023-01-31 ENCOUNTER — Ambulatory Visit: Payer: Medicare Other | Admitting: Physical Therapy

## 2023-01-31 DIAGNOSIS — G9340 Encephalopathy, unspecified: Secondary | ICD-10-CM | POA: Diagnosis not present

## 2023-01-31 LAB — GLUCOSE, CAPILLARY
Glucose-Capillary: 102 mg/dL — ABNORMAL HIGH (ref 70–99)
Glucose-Capillary: 105 mg/dL — ABNORMAL HIGH (ref 70–99)
Glucose-Capillary: 115 mg/dL — ABNORMAL HIGH (ref 70–99)
Glucose-Capillary: 86 mg/dL (ref 70–99)
Glucose-Capillary: 90 mg/dL (ref 70–99)
Glucose-Capillary: 91 mg/dL (ref 70–99)
Glucose-Capillary: 85 mg/dL (ref 70–99)

## 2023-01-31 MED ORDER — PANTOPRAZOLE SODIUM 40 MG PO TBEC: 40.0000 mg | DELAYED_RELEASE_TABLET | Freq: Every day | ORAL | Status: AC

## 2023-01-31 NOTE — Progress Notes (Signed)
Report called in to RN Psychologist, educational at [facility]. Room 124b is ready. All questions and concerns addressed. A copy of the after visit summary was provided to the Pt per request. All belongings packed, IV removed, VS wnL and Pt dressed in clothes of choice. Awaiting on Ptar for transport.

## 2023-01-31 NOTE — TOC Transition Note (Signed)
Transition of Care [facility]) - CM/SW Discharge Note   Patient Details  Name: Justin Hampton MRN: [id number] Date of Birth: 04/20/1946  [facility]) CM/SW Contact:  Kermit Balo, RN Phone Number: 01/31/2023, 10:43 AM   Clinical Narrative:    Pt is discharging to [facility]. Pt received auth 4/8 - 4/10 ,review due 4/10. Reference ID: [id number] . He will transport via [facility]. CM has updated pts daughter and bedside Rn.  Number for report: [phone]   Final next level of care: Skilled Nursing Facility Barriers to Discharge: No Barriers Identified   Patient Goals and CMS Choice CMS Medicare.gov Compare Post Acute Care list provided to:: Patient Choice offered to / list presented to : Patient, Adult Children  Discharge Placement                Patient chooses bed at: [facility] Patient to be transferred to facility by: PTAR Name of family member notified: Sarah Patient and family notified of of transfer: 01/31/23  Discharge Plan and Services Additional resources added to the After Visit Summary for   In-house Referral: Clinical Social Work Discharge Planning Services: CM Consult Post Acute Care Choice: Skilled Nursing Facility                               Social Determinants of Health (SDOH) Interventions SDOH Screenings   Food Insecurity: No Food Insecurity (01/26/2023)  Housing: Low Risk  (01/26/2023)  Transportation Needs: No Transportation Needs (01/26/2023)  Utilities: Not At Risk (01/26/2023)  Tobacco Use: Medium Risk (01/25/2023)     Readmission Risk Interventions     No data to display

## 2023-01-31 NOTE — Discharge Summary (Signed)
Name: Justin Hampton MRN: [id number] DOB: 03-31-1946 77 y.o. 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)   CKD (chronic kidney disease), stage III   Hypothyroidism (acquired)   Gastroesophageal reflux disease   Degenerative disc disease, cervical   Nocturia   Portal hypertension     Discharge Medications: Allergies as of 01/31/2023       Reactions   Morphine Other (See Comments)   Caused confusion/During appendectomy   Zolpidem Other (See Comments)   Confusion        Medication List     STOP taking these medications    carvedilol 10 MG 24 hr capsule Commonly known as: COREG CR   tamsulosin 0.4 MG Caps capsule Commonly known as: FLOMAX       TAKE these medications    albuterol 108 (90 Base) MCG/ACT inhaler Commonly known as: VENTOLIN HFA Inhale 1-2 puffs into the lungs every 6 (six) hours as needed for wheezing or shortness of breath.   AQUAPHOR EX Apply 1 application topically 2 (two) times daily as needed (for dry skin).   Breo Ellipta 100-25 MCG/ACT Aepb Generic drug: fluticasone furoate-vilanterol Inhale 1 puff into the lungs daily.   lactulose (encephalopathy) 10 GM/15ML Soln Commonly known as: CHRONULAC Take 30 g by mouth in the morning, at noon, and at bedtime.   levothyroxine 50 MCG tablet Commonly known as: SYNTHROID Take 50 mcg by mouth daily.   oxybutynin 5 MG tablet Commonly known as: DITROPAN Take 5 mg by mouth at bedtime.   pantoprazole 40 MG tablet Commonly known as: PROTONIX Take 1 tablet (40 mg total) by mouth daily.   rifaximin 550 MG Tabs tablet Commonly known as: XIFAXAN Take 550 mg by mouth 2 (two) times daily.   spironolactone 25 MG tablet Commonly known  as: ALDACTONE Take 25 mg by mouth daily.               Durable Medical Equipment  (From admission, onward)           Start     Ordered   01/26/23 1600  For home use only DME 3 n 1  Once        01/26/23 1559             Disposition and follow-up:    [name] was discharged from [facility] in Good condition.  At the hospital follow up visit please address:   1.  Hepatic encephalopathy secondary to cirrhosis: evaluate mental status and ensure that patient remains at baseline, confirm patient has been taking prescribed doses of lactulose and rifaximin, inquire about bowel movement frequency with goal of 3-4 per day  2.  Isolated episode of bight red stool: inquire about further episodes of red-colored stool or anemia symptoms, consider checking hemoglobin   3.  Concern for obstructive lung disease: inquire about breath shortness and frequency of albuterol use, consider referral to pulmonology for PFT evaluation  4. Labs / imaging needed at time of follow-up: consider CBC, PFT  5.  Pending labs / tests needing follow-up: none    Follow-up Appointments:  Follow-up Information     Verlon Au, MD. Schedule an appointment as soon as possible for a visit.   Specialty: Family  Medicine Contact information: [address] [place] [address] [phone]                 Hospital Course by problem list:  [name] is a 77 year old male with a past medical history of hemophilia B, obstructive sleep apnea, hypothyroidism, cervical degenerative disc disease, and cirrhosis secondary to hepatitis C who presented with weakness and confusion, now admitted for hepatic encephalopathy.     ---Hepatic encephalopathy, resolved ---Cirrhosis secondary to hepatitis C ---Thrombocytopenia  Patient has history of cirrhosis secondary to hepatitis C complicated by hepatic encephalopathy. Previously treated with  interferon and ribavirin in 2000, last viral load undetectable per patient. Presented with confusion after attempting to wean himself off his home lactulose. Upon arrival, laboratory testing demonstrated Plat 145 around his baseline. Mental status improved after resuming home medications of lactulose and rifaximin. Bowel movements have become more regular, about 2-3 per day, since returning to home lactulose dose of 30g thrice daily. Platelets have remained stable throughout hospitalization. Discharged to SNF for short-term rehabilitation with previous home regimen of lactulose, rifaximin, and spironolactone.     ---Bright red blood per rectum, resolved Patient reports one episode of red-tinted stool on 4-6, vital signs and hemoglobin remained stable throughout hospitalization. Rectal examination negative for external or internal hemorrhoids. 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 Abdominal CT in 2022 negative for findings concerning for diverticulosis. Since resuming home lactulose, patient has been producing bowel movements and presumably wiping more frequently. Differential includes possible rectal varix, internal hemorrhoids, or anal fissure secondary to wiping. Since that initial episode, patient denied any further bleeding or red-colored stool. Discharged with instructions to monitor stool color and return if significant blood present.     ---Macrocytic anemia Upon arrival, laboratory testing demonstrated hemoglobin within baseline of 10-12. Both vitamin B9 and B12 are within normal range. Etiology likely longstanding cirrhosis. Patient reported one isolated episode of red-tinted stool on 4-6 without recurrence, hemoglobin remained stable throughout hospitalization.     ---Hypertension Patient has history of hypertension managed with spironolactone. Blood pressure has remained stable within normal range throughout current hospitalization. Discharged  with home dose of spironolactone.     ---Concern for obstructive lung disease Patient reports taking fluticasone-vilanterol daily and albuterol as needed for wheezing. Electronic medical record negative for PFT results. Vitals and breathing have been stable throughout current hospitalization without supplemental oxygen. Discharged with home medications and encouraged to undergo formal PFT evaluation.     ---Chronic kidney disease IIIa Patient has history of chronic kidney disease with baseline creatinine around 0.7-1.2. Upon arrival, creatinine elevated to 1.36 but then improved and remained stable within baseline range throughout hospitalization.     ---Hypothyroidism Patient has history of hypothyroidism managed with levothyroxine. Upon arrival, TSH was within normal limits. Levothyroxine was continued throughout hospitalization and upon discharge.    ---Nocturia Patient has history of nocturia managed with oxybutynin, which was continued throughout hospitalization and upon discharge.     ---Gastroesophageal reflux disease Patient has history of gastroesophageal reflux disease managed with pantoprazole, which was continued throughout hospitalization and upon discharge.    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 [facility] Discharged with instructions to continue following hematologist as outpatient.      Discharge Exam:    BP 123/76 (BP Location: Right Arm)  Pulse 94   Temp 98.6 F (37 C) (Oral)   Resp 16   Ht 5\' 10"  (1.778 m)   Wt 88.5 kg   SpO2 98%   BMI 27.99 kg/m   Subjective: Patient reports feeling good this morning, experiencing a little throat irritation. Denies soreness, pain, dysphagia, or difficulty breathing. States that he has been producing about 2-3 bowel  movements per day since resuming home dose of lactulose. Discussed plan of discharging today to [facility].  General:                       awake and alert, sitting comfortably in chair, cooperative, not in acute distress Skin:                             warm and dry, intact without any obvious lesions or scars, no rashes Lungs:                          normal respiratory effort, breathing unlabored, symmetrical chest rise Cardiac:                        regular rate and rhythm, normal S1 and S2 Abdomen:                     soft and non-distended, normoactive bowel sounds, no tenderness to palpation or guarding Neurologic:                   oriented to person-place-time, moving all extremities Psychiatric:                   euthymic mood with congruent affect, intelligible speech     Pertinent Labs, Studies, and Procedures:   Labs:    Latest Ref Rng & Units 01/29/2023    9:54 AM 01/28/2023    4:14 AM 01/27/2023    4:56 AM  CBC  WBC 4.0 - 10.5 K/uL 8.0  6.8  5.6   Hemoglobin 13.0 - 17.0 g/dL 33.8  32.9  19.1   Hematocrit 39.0 - 52.0 % 39.5  35.1  32.3   Platelets 150 - 400 K/uL 139  124  120       Latest Ref Rng & Units 01/29/2023    9:54 AM 01/28/2023    4:14 AM 01/27/2023    4:56 AM  BMP  Glucose 70 - 99 mg/dL 74  91  96   BUN 8 - 23 mg/dL 20  19  25    Creatinine 0.61 - 1.24 mg/dL 6.60  6.00  4.59   Sodium 135 - 145 mmol/L 137  137  138   Potassium 3.5 - 5.1 mmol/L 3.7  3.6  3.7   Chloride 98 - 111 mmol/L 108  103  111   CO2 22 - 32 mmol/L 22  24  21    Calcium 8.9 - 10.3 mg/dL 8.8  8.8  8.7     ______________________  Imaging:  MR BRAIN WO CONTRAST Result Date: 01/26/2023 IMPRESSION: 1. No acute intracranial abnormality. 2. Nonspecific generalized cerebral hemisphere atrophy, with brainstem and cerebellum relatively spared. Underlying confluent white matter T2/FLAIR hyperintensity which is also nonspecific but most commonly due to small vessel disease. Cerebral  Atrophy (ICD10-G31.9).   DG Chest Portable 1 View Result Date: 01/26/2023 IMPRESSION: No active disease.   CT Head Wo Contrast Result  Date: 01/25/2023 IMPRESSION: 1. No acute intracranial abnormality. Stable senescent change.   ______________________  Procedures:  none  ______________________   Discharge Instructions:  Discharge Instructions     Diet - low sodium heart healthy   Complete by: As directed    Discharge instructions   Complete by: As directed    Mr Jaggard,  It was a pleasure taking care of you while you were in the hospital. Your weakness and confusion were caused by the accumulation of ammonia, which occurs in cirrhosis. The lactulose and rifaximin are important to help remove ammonia and prevent this from happening in the future. After you arrived, we resumed these medications at the prescribed home doses and your mental state improved.  While you were here, we also closely monitored your hemoglobin levels to ensure that you were not bleeding. We performed several imaging studies and successfully ruled out any significant source of bleeding. The most likely cause of this is an anal fissure from frequent wiping. Please pay attention to the color of your stool and return to the emergency department if you notice a large amount of blood in the toilet.  Good luck at [facility], we wish you the best!   Increase activity slowly   Complete by: As directed      Mr Madden,  It was a pleasure taking care of you while you were in the hospital. Your weakness and confusion were caused by the accumulation of ammonia, which occurs in cirrhosis. The lactulose and rifaximin are important to help remove ammonia and prevent this from happening in the future. After you arrived, we resumed these medications at the prescribed home doses and your mental state improved.  While you were here, we also closely monitored your hemoglobin levels to ensure that you were not  bleeding. We performed several imaging studies and successfully ruled out any significant source of bleeding. The most likely cause of this is an anal fissure from frequent wiping. Please pay attention to the color of your stool and return to the emergency department if you notice a large amount of blood in the toilet.  Good luck at [facility], we wish you the best!   Signed: Lajuana Ripple, MD Internal Medicine PGY-1 Pager [phone]

## 2023-01-31 NOTE — Progress Notes (Signed)
Occupational Therapy Treatment Patient Details Name: Justin Hampton MRN: [id number] DOB: 1946-03-27 Today's Date: 01/31/2023   History of present illness Pt is a 77 y.o. male who presented 01/25/23 with AMS and several falls over the past few days. MRI negative for acute intracranial abnormality. Admitted with acute encephalopathy. PMH: hemophilia B, vascular dementia, hepatic encephalopathy, cirrhosis due to hepatitis C,  hypothyroidism, OSA, GERD, and cervical DDD   OT comments  Patient demonstrated good gains this treatment session with patient able to perform grooming and UB bathing standing at sink from sitting with min guard assist for safety. Patient min guard for transfers and mobility with RW. Patient will benefit from continued inpatient follow up therapy, <3 hours/day to address self care and functional transfers.     Recommendations for follow up therapy are one component of a multi-disciplinary discharge planning process, led by the attending physician.  Recommendations may be updated based on patient status, additional functional criteria and insurance authorization.    Assistance Recommended at Discharge Frequent or constant Supervision/Assistance  Patient can return home with the following  A little help with walking and/or transfers;A lot of help with bathing/dressing/bathroom;Direct supervision/assist for medications management;Direct supervision/assist for financial management;Assist for transportation;Help with stairs or ramp for entrance;Assistance with cooking/housework   Equipment Recommendations  Other (comment)    Recommendations for Other Services      Precautions / Restrictions Precautions Precautions: Fall Restrictions Weight Bearing Restrictions: No       Mobility Bed Mobility Overal bed mobility: Needs Assistance Bed Mobility: Supine to Sit     Supine to sit: Min assist     General bed mobility comments: assistance with trunk    Transfers Overall  transfer level: Needs assistance Equipment used: Rolling walker (2 wheels) Transfers: Sit to/from Stand, Bed to chair/wheelchair/BSC Sit to Stand: Min guard     Step pivot transfers: Min guard     General transfer comment: min guard to power up and for safety with transfers     Balance Overall balance assessment: Needs assistance Sitting-balance support: No upper extremity supported, Feet supported, Feet unsupported Sitting balance-Leahy Scale: Fair     Standing balance support: During functional activity, Single extremity supported, No upper extremity supported Standing balance-Leahy Scale: Poor Standing balance comment: able to stand at sink for grooming and UB bathing                           ADL either performed or assessed with clinical judgement   ADL Overall ADL's : Needs assistance/impaired     Grooming: Wash/dry hands;Wash/dry face;Oral care;Brushing hair;Min guard;Standing Grooming Details (indicate cue type and reason): at sink Upper Body Bathing: Min guard;Standing Upper Body Bathing Details (indicate cue type and reason): at sink Lower Body Bathing: Moderate assistance;Sitting/lateral leans;Sit to/from stand   Upper Body Dressing : Minimal assistance;Standing   Lower Body Dressing: Moderate assistance;Sitting/lateral leans Lower Body Dressing Details (indicate cue type and reason): to change socks               General ADL Comments: progressed to standing at sink for grooming/UB bathing    Extremity/Trunk Assessment              Vision       Perception     Praxis      Cognition Arousal/Alertness: Awake/alert Behavior During Therapy: WFL for tasks assessed/performed Overall Cognitive Status: Impaired/Different from baseline Area of Impairment: Problem solving, Memory, Safety/judgement  Memory: Decreased short-term memory   Safety/Judgement: Decreased awareness of deficits, Decreased awareness of  safety   Problem Solving: Slow processing, Requires verbal cues          Exercises      Shoulder Instructions       General Comments VSS on RA    Pertinent Vitals/ Pain       Pain Assessment Pain Assessment: No/denies pain  Home Living                                          Prior Functioning/Environment              Frequency  Min 2X/week        Progress Toward Goals  OT Goals(current goals can now be found in the care plan section)  Progress towards OT goals: Progressing toward goals  Acute Rehab OT Goals Patient Stated Goal: go to rehab OT Goal Formulation: With patient Time For Goal Achievement: 02/10/23 Potential to Achieve Goals: Good ADL Goals Pt Will Perform Lower Body Bathing: with set-up;sit to/from stand;sitting/lateral leans Pt Will Perform Lower Body Dressing: with set-up;sit to/from stand;sitting/lateral leans Pt Will Transfer to Toilet: with set-up;ambulating;regular height toilet Pt Will Perform Toileting - Clothing Manipulation and hygiene: with set-up;sitting/lateral leans;sit to/from stand Additional ADL Goal #1: Patient will be able to complete cognitive task without errors in order to return home independently.  Plan Discharge plan remains appropriate    Co-evaluation                 AM-PAC OT "6 Clicks" Daily Activity     Outcome Measure   Help from another person eating meals?: None Help from another person taking care of personal grooming?: None Help from another person toileting, which includes using toliet, bedpan, or urinal?: A Little Help from another person bathing (including washing, rinsing, drying)?: A Little Help from another person to put on and taking off regular upper body clothing?: A Little Help from another person to put on and taking off regular lower body clothing?: A Lot 6 Click Score: 19    End of Session Equipment Utilized During Treatment: Gait belt;Rolling walker (2  wheels)  OT Visit Diagnosis: Unsteadiness on feet (R26.81);Other abnormalities of gait and mobility (R26.89);Muscle weakness (generalized) (M62.81);Other symptoms and signs involving cognitive function   Activity Tolerance Patient tolerated treatment well   Patient Left in chair;with call bell/phone within reach;with chair alarm set;with nursing/sitter in room   Nurse Communication Mobility status        Time: 0383-3383 OT Time Calculation (min): 27 min  Charges: OT General Charges $OT Visit: 1 Visit OT Treatments $Self Care/Home Management : 23-37 mins  Alfonse Flavors, OTA Acute Rehabilitation Services  Office [phone]   Dewain Penning 01/31/2023, 2:25 PM

## 2023-02-02 ENCOUNTER — Encounter: Payer: Medicare Other | Admitting: Physical Therapy

## 2023-02-07 ENCOUNTER — Ambulatory Visit: Payer: Medicare Other | Admitting: Physical Therapy

## 2023-02-09 ENCOUNTER — Encounter: Payer: Medicare Other | Admitting: Physical Therapy

## 2023-02-14 ENCOUNTER — Encounter: Payer: Medicare Other | Admitting: Physical Therapy

## 2023-02-16 ENCOUNTER — Ambulatory Visit: Payer: Medicare Other | Admitting: Physical Therapy

## 2023-02-21 ENCOUNTER — Ambulatory Visit: Payer: Medicare Other | Admitting: Physical Therapy

## 2023-02-23 ENCOUNTER — Encounter: Payer: Medicare Other | Admitting: Physical Therapy

## 2023-02-28 ENCOUNTER — Encounter: Payer: Medicare Other | Admitting: Physical Therapy

## 2023-03-02 ENCOUNTER — Encounter: Payer: Medicare Other | Admitting: Physical Therapy

## 2023-03-07 ENCOUNTER — Encounter: Payer: Medicare Other | Admitting: Physical Therapy

## 2023-03-09 ENCOUNTER — Encounter: Payer: Medicare Other | Admitting: Physical Therapy

## 2023-03-14 ENCOUNTER — Encounter: Payer: Medicare Other | Admitting: Physical Therapy

## 2023-03-16 ENCOUNTER — Encounter: Payer: Medicare Other | Admitting: Physical Therapy

## 2023-03-21 ENCOUNTER — Encounter: Payer: Medicare Other | Admitting: Physical Therapy
